# Patient Record
Sex: Female | Born: 1950 | Race: White | Hispanic: No | Smoking: Never smoker
Health system: Southern US, Community
[De-identification: ages and names within clinical notes are randomized; demographics above are authoritative.]

## PROBLEM LIST (undated history)

## (undated) DIAGNOSIS — R195 Other fecal abnormalities: Secondary | ICD-10-CM

## (undated) DIAGNOSIS — I10 Essential (primary) hypertension: Secondary | ICD-10-CM

## (undated) DIAGNOSIS — D649 Anemia, unspecified: Secondary | ICD-10-CM

## (undated) DIAGNOSIS — K219 Gastro-esophageal reflux disease without esophagitis: Secondary | ICD-10-CM

## (undated) DIAGNOSIS — S0300XA Dislocation of jaw, unspecified side, initial encounter: Secondary | ICD-10-CM

## (undated) HISTORY — DX: Other fecal abnormalities: R19.5

## (undated) HISTORY — DX: Gastro-esophageal reflux disease without esophagitis: K21.9

## (undated) HISTORY — PX: TUBAL LIGATION: SHX77

## (undated) HISTORY — DX: Anemia, unspecified: D64.9

## (undated) HISTORY — PX: KNEE SURGERY: SHX244

## (undated) HISTORY — DX: Essential (primary) hypertension: I10

## (undated) HISTORY — PX: TOE SURGERY: SHX1073

---

## 2001-08-11 ENCOUNTER — Other Ambulatory Visit: Admission: RE | Admit: 2001-08-11 | Discharge: 2001-08-11 | Payer: Self-pay | Admitting: *Deleted

## 2001-11-20 LAB — HM MAMMOGRAPHY

## 2002-04-29 LAB — HM DEXA SCAN

## 2013-12-23 ENCOUNTER — Ambulatory Visit: Payer: Self-pay | Admitting: Family Medicine

## 2013-12-23 LAB — BASIC METABOLIC PANEL
BUN: 20 mg/dL (ref 4–21)
CREATININE: 0.8 mg/dL (ref 0.5–1.1)
GLUCOSE: 87 mg/dL
POTASSIUM: 4.3 mmol/L (ref 3.4–5.3)
Sodium: 141 mmol/L (ref 137–147)

## 2013-12-23 LAB — HEPATIC FUNCTION PANEL
ALT: 11 U/L (ref 7–35)
AST: 16 U/L (ref 13–35)
Alkaline Phosphatase: 62 U/L (ref 25–125)
Bilirubin, Total: 0.4 mg/dL

## 2013-12-23 LAB — CBC AND DIFFERENTIAL
HEMATOCRIT: 38 % (ref 36–46)
HEMOGLOBIN: 12.6 g/dL (ref 12.0–16.0)
Neutrophils Absolute: 2 /uL
PLATELETS: 226 10*3/uL (ref 150–399)
WBC: 5.9 10*3/mL

## 2013-12-23 LAB — LIPID PANEL
Cholesterol: 210 mg/dL — AB (ref 0–200)
HDL: 60 mg/dL (ref 35–70)
LDL Cholesterol: 120 mg/dL
LDl/HDL Ratio: 2
Triglycerides: 148 mg/dL (ref 40–160)

## 2013-12-23 LAB — TSH: TSH: 2.12 u[IU]/mL (ref 0.41–5.90)

## 2014-02-04 ENCOUNTER — Ambulatory Visit: Payer: Self-pay | Admitting: Gastroenterology

## 2014-02-04 LAB — HM COLONOSCOPY

## 2014-07-27 ENCOUNTER — Ambulatory Visit: Payer: Self-pay | Admitting: Family Medicine

## 2015-01-18 ENCOUNTER — Telehealth: Payer: Self-pay | Admitting: Family Medicine

## 2015-01-18 MED ORDER — CYCLOBENZAPRINE HCL 10 MG PO TABS
10.0000 mg | ORAL_TABLET | Freq: Every day | ORAL | Status: DC
Start: 2015-01-18 — End: 2015-02-02

## 2015-01-18 NOTE — Telephone Encounter (Signed)
Okay to try cyclobenzaprine at bedtime the night to take less of the ibuprofen. 10 mg at bedtime. #30. 5 refills.g

## 2015-01-18 NOTE — Telephone Encounter (Signed)
Pt would like to speak to someone about ibuprofen dosage for pain.  Please call her [phone]

## 2015-01-18 NOTE — Telephone Encounter (Signed)
Pt reports that she has been having sciatica and she will not be able to get in for a couple of weeks for this. But she has been taking Ibuprofen 800 mg twice a day but sometimes she has to take it 3 times a day and wants to make sure that is on for her to take. She is taking her Ranitidine daily. She has in her allscripts chart that she is on Meloxicam. She is NOT taking it with the Ibuprofen because she knows she cant take them together. She also reports that she does not sleep well because of her legs aching and it keeps her up at night. She was wondering if a muscle relaxer if a muscle relaxer would help her instead of taking the Ibuprofen so much. Please advise.

## 2015-01-18 NOTE — Telephone Encounter (Signed)
Pt informed. Med sent to pharmacy.

## 2015-01-18 NOTE — Telephone Encounter (Signed)
LMTCB

## 2015-01-27 DIAGNOSIS — K219 Gastro-esophageal reflux disease without esophagitis: Secondary | ICD-10-CM | POA: Insufficient documentation

## 2015-01-27 DIAGNOSIS — M549 Dorsalgia, unspecified: Secondary | ICD-10-CM

## 2015-01-27 DIAGNOSIS — I10 Essential (primary) hypertension: Secondary | ICD-10-CM | POA: Insufficient documentation

## 2015-01-27 DIAGNOSIS — F32A Depression, unspecified: Secondary | ICD-10-CM | POA: Insufficient documentation

## 2015-01-27 DIAGNOSIS — F419 Anxiety disorder, unspecified: Secondary | ICD-10-CM | POA: Insufficient documentation

## 2015-01-27 DIAGNOSIS — E785 Hyperlipidemia, unspecified: Secondary | ICD-10-CM | POA: Insufficient documentation

## 2015-01-27 DIAGNOSIS — G8929 Other chronic pain: Secondary | ICD-10-CM | POA: Insufficient documentation

## 2015-01-27 DIAGNOSIS — J309 Allergic rhinitis, unspecified: Secondary | ICD-10-CM | POA: Insufficient documentation

## 2015-01-27 DIAGNOSIS — F329 Major depressive disorder, single episode, unspecified: Secondary | ICD-10-CM | POA: Insufficient documentation

## 2015-01-27 DIAGNOSIS — L821 Other seborrheic keratosis: Secondary | ICD-10-CM | POA: Insufficient documentation

## 2015-01-27 DIAGNOSIS — G4700 Insomnia, unspecified: Secondary | ICD-10-CM | POA: Insufficient documentation

## 2015-02-02 ENCOUNTER — Encounter: Payer: Self-pay | Admitting: Family Medicine

## 2015-02-02 ENCOUNTER — Ambulatory Visit
Admission: RE | Admit: 2015-02-02 | Discharge: 2015-02-02 | Disposition: A | Discharge status: Home / Self Care | Payer: 59 | Source: Ambulatory Visit | Attending: Family Medicine | Admitting: Family Medicine

## 2015-02-02 ENCOUNTER — Ambulatory Visit (INDEPENDENT_AMBULATORY_CARE_PROVIDER_SITE_OTHER): Payer: 59 | Admitting: Family Medicine

## 2015-02-02 VITALS — BP 130/72 | HR 76 | Temp 98.0°F | Resp 16 | Ht 61.0 in | Wt 117.0 lb

## 2015-02-02 DIAGNOSIS — M5442 Lumbago with sciatica, left side: Secondary | ICD-10-CM | POA: Diagnosis not present

## 2015-02-02 DIAGNOSIS — M5441 Lumbago with sciatica, right side: Secondary | ICD-10-CM | POA: Insufficient documentation

## 2015-02-02 NOTE — Progress Notes (Signed)
Patient ID: Jocelyn Harris, female   DOB: 24-Sep-1950, 64 y.o.   MRN: [id number]   CHANNELL QUATTRONE  MRN: [id number] DOB: [birth year]  Subjective:  HPI   1. Bilateral low back pain with sciatica, sciatica laterality unspecified Patient is a 64 year old who present with back pain.  She has had a history of pain and had been doing well until the last few months.  She states that the pain radiated down both of her legs.  She does note that she is on her feet more at work, she has been putting in more hours at work also.  While at work she states that she works on [employer].  She has taken Meloxicam in the past and stopped using it about 3-4 weeks ago because she was reading about some of the side effects.  She had also been taking Ibuprofen while on the Meloxicam.  She would take Ibuprofen during the day and Meloxicam at night.  She has since switched to just taking Ibuprofen and is using 800 mg three times a day.  She has had relief of her pain while taking the Ibuprofen.  She has taken 4 in a day once or twice.  She has been cautioned that taking that much is exceeding the maximum dose and she has been advised of the risks involved with taking any anti inflammatory medications.  She has never had any x-rays made of her spine.   Patient Active Problem List   Diagnosis Date Noted  . Allergic rhinitis 01/27/2015  . Anxiety 01/27/2015  . Back pain, chronic 01/27/2015  . Clinical depression 01/27/2015  . Acid reflux 01/27/2015  . HLD (hyperlipidemia) 01/27/2015  . BP (high blood pressure) 01/27/2015  . Cannot sleep 01/27/2015  . 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  Social History   Social History  . Marital Status: Divorced    Spouse Name: N/A  . Number of Children: N/A  . Years of Education: N/A   Occupational History  . Not on file.   Social History Main Topics  . Smoking status: Never Smoker   . Smokeless tobacco: Not on file  . Alcohol Use: 0.0  oz/week    0 Standard drinks or equivalent per week  . Drug Use: No  . Sexual Activity: Not on file   Other Topics Concern  . Not on file   Social History Narrative    Outpatient Prescriptions Prior to Visit  Medication Sig Dispense Refill  . busPIRone (BUSPAR) 10 MG tablet Take by mouth.    . calcium gluconate 500 MG tablet Take by mouth.    . Cholecalciferol 1000 UNITS capsule Take by mouth.    . fexofenadine (ALLEGRA) 180 MG tablet Take by mouth.    . fluticasone (FLONASE) 50 MCG/ACT nasal spray Place into the nose.    Marland Kitchen lisinopril (PRINIVIL,ZESTRIL) 10 MG tablet Take by mouth.    . MULTIPLE VITAMINS PO Take by mouth.    . ranitidine (ZANTAC) 150 MG tablet Take by mouth.    . traZODone (DESYREL) 150 MG tablet Take by mouth.    . vitamin E 100 UNIT capsule Take by mouth.    . conjugated estrogens (PREMARIN) vaginal cream Place vaginally.    . cyclobenzaprine (FLEXERIL) 10 MG tablet Take 1 tablet (10 mg total) by mouth at bedtime. 30 tablet 5  . meloxicam (MOBIC) 15 MG tablet Take by mouth.     No facility-administered medications prior to  visit.    Allergies  Allergen Reactions  . Doxycycline Nausea Only and Nausea And Vomiting    Review of Systems  Constitutional: Negative.   Eyes: Negative.   Respiratory: Positive for cough (non productive).   Cardiovascular: Negative.   Gastrointestinal: Negative.   Musculoskeletal: Positive for back pain and joint pain (knees). Negative for myalgias, falls and neck pain.  Neurological: Negative for dizziness and headaches.  Psychiatric/Behavioral: Negative.    Objective:  BP 130/72 mmHg  Pulse 76  Temp(Src) 98 F (36.7 C) (Oral)  Resp 16  Ht 5\' 1"  (1.549 m)  Wt 117 lb (53.071 kg)  BMI 22.12 kg/m2  Physical Exam  Constitutional: She is well-developed, well-nourished, and in no distress.  Eyes: Conjunctivae are normal.  Neck: Neck supple.  Cardiovascular: Normal rate and normal heart sounds.   Pulmonary/Chest: Effort  normal.  Abdominal: Soft.  Musculoskeletal: Normal range of motion.  Neurological: She is alert. Gait normal.  Skin: Skin is warm and dry.  Psychiatric: Mood, memory, affect and judgment normal.    Assessment and Plan :   Chronic Back Pain With a flare of sciatica.Discussed management of chronic pain. Consider PT referral. May need imaging. 1. Bilateral low back pain with sciatica, sciatica laterality unspecified  - DG Lumbar Spine Complete; Future - DG Sacrum/Coccyx; Future   Miguel Aschoff MD [facility] 02/02/2015 11:21 AM

## 2015-02-02 NOTE — Patient Instructions (Signed)
Back Exercises Back exercises help treat and prevent back injuries. The goal is to increase your strength in your belly (abdominal) and back muscles. These exercises can also help with flexibility. Start these exercises when told by your doctor. HOME CARE Back exercises include: Pelvic Tilt.  Lie on your back with your knees bent. Tilt your pelvis until the lower part of your back is against the floor. Hold this position 5 to 10 sec. Repeat this exercise 5 to 10 times. Knee to Chest.  Pull 1 knee up against your chest and hold for 20 to 30 seconds. Repeat this with the other knee. This may be done with the other leg straight or bent, whichever feels better. Then, pull both knees up against your chest. Sit-Ups or Curl-Ups.  Bend your knees 90 degrees. Start with tilting your pelvis, and do a partial, slow sit-up. Only lift your upper half 30 to 45 degrees off the floor. Take at least 2 to 3 seonds for each sit-up. Do not do sit-ups with your knees out straight. If partial sit-ups are difficult, simply do the above but with only tightening your belly (abdominal) muscles and holding it as told. Hip-Lift.  Lie on your back with your knees flexed 90 degrees. Push down with your feet and shoulders as you raise your hips 2 inches off the floor. Hold for 10 seconds, repeat 5 to 10 times. Back Arches.  Lie on your stomach. Prop yourself up on bent elbows. Slowly press on your hands, causing an arch in your low back. Repeat 3 to 5 times. Shoulder-Lifts.  Lie face down with arms beside your body. Keep hips and belly pressed to floor as you slowly lift your head and shoulders off the floor. Do not overdo your exercises. Be careful in the beginning. Exercises may cause you some mild back discomfort. If the pain lasts for more than 15 minutes, stop the exercises until you see your doctor. Improvement with exercise for back problems is slow.  Document Released: 07/07/2010 Document Revised: 08/27/2011  Document Reviewed: 04/05/2011 [facility] Patient Information 2015 [place], [place]. This information is not intended to replace advice given to you by your health care provider. Make sure you discuss any questions you have with your health care provider. Back Exercises Back exercises help treat and prevent back injuries. The goal of back exercises is to increase the strength of your abdominal and back muscles and the flexibility of your back. These exercises should be started when you no longer have back pain. Back exercises include:  Pelvic Tilt. Lie on your back with your knees bent. Tilt your pelvis until the lower part of your back is against the floor. Hold this position 5 to 10 sec and repeat 5 to 10 times.  Knee to Chest. Pull first 1 knee up against your chest and hold for 20 to 30 seconds, repeat this with the other knee, and then both knees. This may be done with the other leg straight or bent, whichever feels better.  Sit-Ups or Curl-Ups. Bend your knees 90 degrees. Start with tilting your pelvis, and do a partial, slow sit-up, lifting your trunk only 30 to 45 degrees off the floor. Take at least 2 to 3 seconds for each sit-up. Do not do sit-ups with your knees out straight. If partial sit-ups are difficult, simply do the above but with only tightening your abdominal muscles and holding it as directed.  Hip-Lift. Lie on your back with your knees flexed 90 degrees. Push down  with your feet and shoulders as you raise your hips a couple inches off the floor; hold for 10 seconds, repeat 5 to 10 times.  Back arches. Lie on your stomach, propping yourself up on bent elbows. Slowly press on your hands, causing an arch in your low back. Repeat 3 to 5 times. Any initial stiffness and discomfort should lessen with repetition over time.  Shoulder-Lifts. Lie face down with arms beside your body. Keep hips and torso pressed to floor as you slowly lift your head and shoulders off the floor. Do not overdo  your exercises, especially in the beginning. Exercises may cause you some mild back discomfort which lasts for a few minutes; however, if the pain is more severe, or lasts for more than 15 minutes, do not continue exercises until you see your caregiver. Improvement with exercise therapy for back problems is slow.  See your caregivers for assistance with developing a proper back exercise program. Document Released: 07/12/2004 Document Revised: 08/27/2011 Document Reviewed: 04/05/2011 [facility] Patient Information 2015 [place], [place]. This information is not intended to replace advice given to you by your health care provider. Make sure you discuss any questions you have with your health care provider.

## 2015-02-04 NOTE — Telephone Encounter (Signed)
Pt returning call.  CB#[phone]/MW

## 2015-02-07 ENCOUNTER — Telehealth: Payer: Self-pay | Admitting: Family Medicine

## 2015-02-07 NOTE — Telephone Encounter (Signed)
Pt advised of xray-aa

## 2015-02-07 NOTE — Telephone Encounter (Signed)
Pt is returning call.  CB#[phone]/MW

## 2015-02-24 ENCOUNTER — Encounter: Payer: Self-pay | Admitting: Family Medicine

## 2015-02-24 ENCOUNTER — Ambulatory Visit (INDEPENDENT_AMBULATORY_CARE_PROVIDER_SITE_OTHER): Payer: 59 | Admitting: Family Medicine

## 2015-02-24 VITALS — BP 134/60 | HR 67 | Temp 97.9°F | Resp 16 | Wt 118.0 lb

## 2015-02-24 DIAGNOSIS — J3089 Other allergic rhinitis: Secondary | ICD-10-CM | POA: Diagnosis not present

## 2015-02-24 DIAGNOSIS — N760 Acute vaginitis: Secondary | ICD-10-CM | POA: Diagnosis not present

## 2015-02-24 DIAGNOSIS — R0981 Nasal congestion: Secondary | ICD-10-CM

## 2015-02-24 DIAGNOSIS — N952 Postmenopausal atrophic vaginitis: Secondary | ICD-10-CM

## 2015-02-24 LAB — POCT URINALYSIS DIPSTICK
Bilirubin, UA: NEGATIVE
Glucose, UA: NEGATIVE
Ketones, UA: NEGATIVE
Leukocytes, UA: NEGATIVE
Nitrite, UA: NEGATIVE
PH UA: 6
PROTEIN UA: NEGATIVE
RBC UA: NEGATIVE
SPEC GRAV UA: 1.01
UROBILINOGEN UA: 0.2

## 2015-02-24 LAB — POCT WET PREP WITH KOH
Clue Cells Wet Prep HPF POC: NEGATIVE
KOH PREP POC: NEGATIVE
RBC Wet Prep HPF POC: NEGATIVE
TRICHOMONAS UA: NEGATIVE
WBC WET PREP PER HPF POC: NEGATIVE
Yeast Wet Prep HPF POC: NEGATIVE

## 2015-02-24 MED ORDER — PREDNISONE 5 MG (21) PO TBPK: 5.0000 mg | ORAL_TABLET | Freq: Every day | ORAL | Status: DC

## 2015-02-24 MED ORDER — ESTROGENS, CONJUGATED 0.625 MG/GM VA CREA: 1.0000 | TOPICAL_CREAM | Freq: Every day | VAGINAL | Status: DC

## 2015-02-24 NOTE — Progress Notes (Signed)
Patient ID: RAFAELITA FOISTER, female   DOB: 1950-12-27, 64 y.o.   MRN: [id number]       Patient: Jocelyn Harris Female    DOB: 04-20-1951   64 y.o.   MRN: [id number] Visit Date: 02/24/2015  Today's Provider: Wilhemena Durie, MD   Chief Complaint  Patient presents with  . Nasal Congestion    started about a week ago.  . Vaginitis    noticed it about 2 weeks ago, bladder also feels full.   Subjective:    HPI  Pt reports taken allegra this am which helped some, she also reports feeling feverish.  [facility]  Allergies  Allergen Reactions  . Doxycycline Nausea Only and Nausea And Vomiting   Previous Medications   BUSPIRONE (BUSPAR) 10 MG TABLET    Take by mouth.   CALCIUM GLUCONATE 500 MG TABLET    Take by mouth.   CHOLECALCIFEROL 1000 UNITS CAPSULE    Take by mouth.   CYCLOBENZAPRINE (FLEXERIL) 10 MG TABLET    Take 10 mg by mouth 3 (three) times daily as needed for muscle spasms.   FEXOFENADINE (ALLEGRA) 180 MG TABLET    Take by mouth.   FLUTICASONE (FLONASE) 50 MCG/ACT NASAL SPRAY    Place into the nose.   IBUPROFEN (ADVIL,MOTRIN) 800 MG TABLET    Take 800 mg by mouth every 8 (eight) hours as needed.   LISINOPRIL (PRINIVIL,ZESTRIL) 10 MG TABLET    Take by mouth.   MULTIPLE VITAMINS PO    Take by mouth.   RANITIDINE (ZANTAC) 150 MG TABLET    Take by mouth.   TRAZODONE (DESYREL) 150 MG TABLET    Take by mouth.   VITAMIN E 100 UNIT CAPSULE    Take by mouth.    Review of Systems  HENT: Positive for congestion, ear discharge, ear pain, rhinorrhea, sinus pressure, sneezing and sore throat. Negative for hearing loss, mouth sores, nosebleeds, tinnitus, trouble swallowing and voice change.   Eyes: Positive for discharge and itching. Negative for visual disturbance.  Respiratory: Negative.   Cardiovascular: Negative for chest pain, palpitations and leg swelling.  Endocrine: Negative.   Genitourinary: Positive for frequency and vaginal pain. Negative for vaginal bleeding, vaginal  discharge and difficulty urinating.       Bladder feels full and feel like need to go to the bathroom more." pt stated  Allergic/Immunologic: Negative.   Neurological: Negative.   Hematological: Negative.   Psychiatric/Behavioral: Negative.     Social History  Substance Use Topics  . Smoking status: Never Smoker   . Smokeless tobacco: Not on file  . Alcohol Use: 0.0 oz/week    0 Standard drinks or equivalent per week   Objective:   BP 134/60 mmHg  Pulse 67  Temp(Src) 97.9 F (36.6 C) (Oral)  Resp 16  Wt 118 lb (53.524 kg)  SpO2 97%  Physical Exam  Constitutional: She is oriented to person, place, and time. She appears well-developed and well-nourished.  HENT:  Head: Normocephalic and atraumatic.  Right Ear: External ear normal.  Left Ear: External ear normal.  Nose: Nose normal.  Eyes: Conjunctivae are normal.  Neck: Neck supple.  Cardiovascular: Normal rate, regular rhythm and normal heart sounds.   Pulmonary/Chest: Effort normal and breath sounds normal.  Abdominal: Soft.  Genitourinary: Vagina normal and uterus normal. No vaginal discharge found.  Atrophic vaginal mucosa.. Cervix is normal. Bimanual exam benign.  Neurological: She is alert and oriented to person, place, and time.  Skin: Skin is  warm and dry.  Psychiatric: She has a normal mood and affect. Her behavior is normal. Judgment and thought content normal.        Assessment & Plan:     1. Nasal congestion May need referral to ENT for workup.  2. Vaginitis  - POCT urinalysis dipstick - GC/chlamydia probe amp, genital - POCT Wet Prep with KOH  3. Other allergic rhinitis I think all sinius symptoms are allergic and not a sinusitis - predniSONE (STERAPRED UNI-PAK 21 TAB) 5 MG (21) TBPK tablet; Take 1 tablet (5 mg total) by mouth daily. As directed 6 day taper  Dispense: 21 tablet; Refill: 0  4. Atrophic vaginitis Treat for 1 week nightly then once a week--every Sunday night 1 gram applied  vaginally. Risks and benefits of this type HRT are discussed. - conjugated estrogens (PREMARIN) vaginal cream; Place 1 Applicatorful vaginally daily.  Dispense: 42.5 g; Refill: 12 5.Chronic Anxiety      Wilhemena Durie, MD  [facility]

## 2015-03-10 ENCOUNTER — Encounter: Payer: Self-pay | Admitting: Family Medicine

## 2015-05-05 ENCOUNTER — Ambulatory Visit: Payer: 59 | Admitting: Family Medicine

## 2015-05-27 ENCOUNTER — Other Ambulatory Visit: Payer: Self-pay | Admitting: Family Medicine

## 2015-07-07 ENCOUNTER — Other Ambulatory Visit: Payer: Self-pay | Admitting: Family Medicine

## 2015-07-07 NOTE — Telephone Encounter (Signed)
Pt contacted office for refill request on the following medications: lisinopril (PRINIVIL,ZESTRIL) 10 MG tablet & traZODone (DESYREL) 150 MG tablet to [place]. Thanks TNP

## 2015-07-07 NOTE — Telephone Encounter (Signed)
Is this ok?-aa 

## 2015-07-08 MED ORDER — TRAZODONE HCL 150 MG PO TABS: 150.0000 mg | ORAL_TABLET | Freq: Every day | ORAL | Status: DC

## 2015-07-08 MED ORDER — LISINOPRIL 10 MG PO TABS: 10.0000 mg | ORAL_TABLET | Freq: Every day | ORAL | Status: DC

## 2015-07-08 NOTE — Telephone Encounter (Signed)
RX sent to [facility].

## 2015-07-08 NOTE — Telephone Encounter (Signed)
Ok for 1 year. 

## 2015-07-20 ENCOUNTER — Telehealth: Payer: Self-pay | Admitting: Family Medicine

## 2015-07-20 MED ORDER — RANITIDINE HCL 150 MG PO TABS: 150.0000 mg | ORAL_TABLET | Freq: Two times a day (BID) | ORAL | Status: DC

## 2015-07-20 MED ORDER — IBUPROFEN 800 MG PO TABS: 800.0000 mg | ORAL_TABLET | Freq: Three times a day (TID) | ORAL | Status: DC | PRN

## 2015-07-20 MED ORDER — BUSPIRONE HCL 10 MG PO TABS: 10.0000 mg | ORAL_TABLET | Freq: Three times a day (TID) | ORAL | Status: DC

## 2015-07-20 NOTE — Telephone Encounter (Signed)
rx sent in-aa 

## 2015-07-20 NOTE — Telephone Encounter (Signed)
Pt needs three new refill sent to [facility] on [address]  busPIRone (BUSPAR) 10 MG tablet ibuprofen (ADVIL,MOTRIN) 800 MG tablet ranitidine (ZANTAC) 150 MG tablet   Pt call back is [phone]  Thanks Con Memos

## 2015-08-03 ENCOUNTER — Other Ambulatory Visit: Payer: Self-pay | Admitting: Family Medicine

## 2015-08-03 DIAGNOSIS — N952 Postmenopausal atrophic vaginitis: Secondary | ICD-10-CM

## 2015-08-03 MED ORDER — ESTROGENS, CONJUGATED 0.625 MG/GM VA CREA: 1.0000 | TOPICAL_CREAM | Freq: Every day | VAGINAL | Status: DC

## 2015-08-03 NOTE — Telephone Encounter (Signed)
RX sent in-aa 

## 2015-08-03 NOTE — Telephone Encounter (Signed)
Pt stated that due to her insurance change she is having to get her medications from [facility] and is having a hard time getting [facility] to transfer them to [facility]. Pt would like a new RX sent to [facility]. Thanks TNP

## 2015-08-25 ENCOUNTER — Encounter: Payer: Self-pay | Admitting: Family Medicine

## 2015-08-25 ENCOUNTER — Ambulatory Visit (INDEPENDENT_AMBULATORY_CARE_PROVIDER_SITE_OTHER): Payer: Worker's Compensation | Admitting: Family Medicine

## 2015-08-25 VITALS — BP 138/72 | HR 62 | Temp 98.4°F | Resp 18 | Wt 123.0 lb

## 2015-08-25 DIAGNOSIS — S0003XA Contusion of scalp, initial encounter: Secondary | ICD-10-CM | POA: Diagnosis not present

## 2015-08-25 DIAGNOSIS — S060X0A Concussion without loss of consciousness, initial encounter: Secondary | ICD-10-CM

## 2015-08-25 NOTE — Patient Instructions (Signed)
Stay out of work for the next two days and allow your brain to rest. F/u with Dr. Rosanna Randy next week.

## 2015-08-25 NOTE — Progress Notes (Signed)
Subjective:     Patient ID: Jocelyn Harris, female   DOB: [birth year], 65 y.o.   MRN: [id number]  HPI  Chief Complaint  Patient presents with  . Head Injury    She hit her at work and it knocked her down but not out.   . Fatigue  . Dizziness    More like lightheaded per pt.  States she works at [employer]. She was putting cardboard in a dumpster 3/7 when the steel door came back and struck her in the back of her head knocking her down. States she had a bump at first then but this has resolved leaving her with residual tenderness and feeling "not herself" and "dazed". States she went to work yesterday and drove herself her. Reports hx of concussion 5 years ago.    Review of Systems     Objective:   Physical Exam  Constitutional: She is oriented to person, place, and time. She appears well-developed and well-nourished. No distress.  HENT:  Right Ear: No hemotympanum.  Left Ear: No hemotympanum.  Eyes: EOM are normal. Pupils are equal, round, and reactive to light.  Musculoskeletal:  Grip strength 5/5 symmetrically  Neurological: She is alert and oriented to person, place, and time. Coordination (Finger to Nose and heel t shin WNL. Romberg negative) normal.  Can recite day of the week, month, year, and president. Can recite months backwards and count down from 20 backwards. Can recite numbers backwards to 5 digits. Can recall 5 words after the third trial.       Assessment:    1. Scalp contusion, initial encounter   2. Mild concussion, without loss of consciousness, initial encounter    Plan:    Work excuse 3/9-3/12. Discussed resting her brain. Further f/u with Dr. Rosanna Randy next week if not back to baseline.

## 2015-08-29 ENCOUNTER — Encounter: Payer: Self-pay | Admitting: Family Medicine

## 2015-08-29 ENCOUNTER — Ambulatory Visit (INDEPENDENT_AMBULATORY_CARE_PROVIDER_SITE_OTHER): Payer: BLUE CROSS/BLUE SHIELD | Admitting: Family Medicine

## 2015-08-29 VITALS — BP 130/72 | HR 64 | Temp 97.9°F | Resp 12 | Wt 124.0 lb

## 2015-08-29 DIAGNOSIS — J3089 Other allergic rhinitis: Secondary | ICD-10-CM | POA: Diagnosis not present

## 2015-08-29 DIAGNOSIS — G44309 Post-traumatic headache, unspecified, not intractable: Secondary | ICD-10-CM

## 2015-08-29 DIAGNOSIS — F329 Major depressive disorder, single episode, unspecified: Secondary | ICD-10-CM

## 2015-08-29 DIAGNOSIS — R5383 Other fatigue: Secondary | ICD-10-CM | POA: Diagnosis not present

## 2015-08-29 DIAGNOSIS — G8929 Other chronic pain: Secondary | ICD-10-CM | POA: Diagnosis not present

## 2015-08-29 DIAGNOSIS — K219 Gastro-esophageal reflux disease without esophagitis: Secondary | ICD-10-CM

## 2015-08-29 DIAGNOSIS — S060X0A Concussion without loss of consciousness, initial encounter: Secondary | ICD-10-CM

## 2015-08-29 DIAGNOSIS — S0003XA Contusion of scalp, initial encounter: Secondary | ICD-10-CM

## 2015-08-29 DIAGNOSIS — F32A Depression, unspecified: Secondary | ICD-10-CM

## 2015-08-29 DIAGNOSIS — M549 Dorsalgia, unspecified: Secondary | ICD-10-CM

## 2015-08-29 MED ORDER — RANITIDINE HCL 150 MG PO TABS: 150.0000 mg | ORAL_TABLET | Freq: Two times a day (BID) | ORAL | Status: DC

## 2015-08-29 MED ORDER — BUSPIRONE HCL 10 MG PO TABS: 10.0000 mg | ORAL_TABLET | Freq: Three times a day (TID) | ORAL | Status: DC

## 2015-08-29 MED ORDER — FLUTICASONE PROPIONATE 50 MCG/ACT NA SUSP: 2.0000 | Freq: Every day | NASAL | Status: DC

## 2015-08-29 MED ORDER — IBUPROFEN 800 MG PO TABS: 800.0000 mg | ORAL_TABLET | Freq: Three times a day (TID) | ORAL | Status: DC | PRN

## 2015-08-29 NOTE — Progress Notes (Signed)
Patient ID: Jocelyn Harris, female   DOB: 1951/04/19, 65 y.o.   MRN: [id number]    Subjective:  HPI Patient saw Mikki Santee on Thursday the 9th 2 days after the accident and this is what patient reported at that time:  States she works at [employer]. She was putting cardboard in a dumpster 3/7 when the steel door came back and struck her in the back of her head knocking her down. States she had a bump at first then but this has resolved leaving her with residual tenderness and feeling "not herself" and "dazed". Reports hx of concussion 5 years ago.   Today patient states she is still having headache where the door hit her and she fell to the ground, and head feels sore, feels like her balance is unsteady and she does not feel as alert. She does state she is better compare to last week. She has not returned to work since Wednesday March 8th.   Prior to Admission medications   Medication Sig Start Date End Date Taking? Authorizing Provider  busPIRone (BUSPAR) 10 MG tablet Take 1 tablet (10 mg total) by mouth 3 (three) times daily. 07/20/15   Jerrol Banana., MD  calcium gluconate 500 MG tablet Take by mouth.    Historical Provider, MD  Cholecalciferol 1000 UNITS capsule Take by mouth.    Historical Provider, MD  conjugated estrogens (PREMARIN) vaginal cream Place 1 Applicatorful vaginally daily. 08/03/15   Richard Maceo Pro., MD  cyclobenzaprine (FLEXERIL) 10 MG tablet Take 10 mg by mouth 3 (three) times daily as needed for muscle spasms.    Historical Provider, MD  EQ ALLERGY RELIEF 180 MG tablet TAKE ONE TABLET BY MOUTH ONCE DAILY 05/27/15   Jerrol Banana., MD  fluticasone [facility]) 50 MCG/ACT nasal spray Place into the nose. 05/01/14   Historical Provider, MD  ibuprofen (ADVIL,MOTRIN) 800 MG tablet Take 1 tablet (800 mg total) by mouth every 8 (eight) hours as needed. 07/20/15   Richard Maceo Pro., MD  lisinopril (PRINIVIL,ZESTRIL) 10 MG tablet Take 1 tablet (10 mg total) by mouth  daily. 07/08/15   Richard Maceo Pro., MD  MULTIPLE VITAMINS PO Take by mouth.    Historical Provider, MD  predniSONE (STERAPRED UNI-PAK 21 TAB) 5 MG (21) TBPK tablet Take 1 tablet (5 mg total) by mouth daily. As directed 6 day taper 02/24/15   Jerrol Banana., MD  ranitidine (ZANTAC) 150 MG tablet Take 1 tablet (150 mg total) by mouth 2 (two) times daily. 07/20/15   Richard Maceo Pro., MD  traZODone (DESYREL) 150 MG tablet Take 1 tablet (150 mg total) by mouth at bedtime. 07/08/15   Jerrol Banana., MD  vitamin E 100 UNIT capsule Take by mouth.    Historical Provider, MD    Patient Active Problem List   Diagnosis Date Noted  . Allergic rhinitis 01/27/2015  . Anxiety 01/27/2015  . Back pain, chronic 01/27/2015  . Clinical depression 01/27/2015  . Acid reflux 01/27/2015  . HLD (hyperlipidemia) 01/27/2015  . BP (high blood pressure) 01/27/2015  . Cannot sleep 01/27/2015  . 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  Social History   Social History  . Marital Status: Divorced    Spouse Name: N/A  . Number of Children: N/A  . Years of Education: N/A   Occupational History  . Not on file.   Social History Main Topics  . Smoking  status: Never Smoker   . Smokeless tobacco: Never Used  . Alcohol Use: 0.0 oz/week    0 Standard drinks or equivalent per week  . Drug Use: No  . Sexual Activity: Not on file   Other Topics Concern  . Not on file   Social History Narrative    Allergies  Allergen Reactions  . Doxycycline Nausea Only and Nausea And Vomiting    Review of Systems  Constitutional: Positive for malaise/fatigue. Negative for fever and chills.  Eyes: Positive for blurred vision.  Respiratory: Negative.   Cardiovascular: Negative.   Musculoskeletal: Positive for back pain (chronic) and neck pain.  Neurological: Positive for headaches. Negative for weakness.       Unsteadiness slight, "not as alert"  Psychiatric/Behavioral: The  patient does not have insomnia.     Immunization History  Administered Date(s) Administered  . Influenza-Unspecified 06/16/2015  . Tdap 12/23/2013  . Zoster 01/20/2014   Objective:  BP 130/72 mmHg  Pulse 64  Temp(Src) 97.9 F (36.6 C)  Resp 12  Wt 124 lb (56.246 kg)  Physical Exam  Constitutional: She is oriented to person, place, and time and well-developed, well-nourished, and in no distress.  HENT:  Head: Normocephalic and atraumatic.  Mouth/Throat: Oropharynx is clear and moist.  Eyes: Conjunctivae are normal.  Neck: Normal range of motion. Neck supple.  Cardiovascular: Normal rate, regular rhythm, normal heart sounds and intact distal pulses.   No murmur heard. Pulmonary/Chest: Effort normal and breath sounds normal. No respiratory distress. She has no wheezes.  Musculoskeletal: Normal range of motion. She exhibits no edema or tenderness.  Neurological: She is alert and oriented to person, place, and time. She displays normal reflexes. No cranial nerve deficit. She exhibits normal muscle tone. Gait normal. Coordination normal. GCS score is 15.  Tender at the top of the head on the exam.Neurologic exam grossly nonfocal.  Psychiatric: Mood, affect and judgment normal.    Lab Results  Component Value Date   WBC 5.9 12/23/2013   HGB 12.6 12/23/2013   HCT 38 12/23/2013   PLT 226 12/23/2013   CHOL 210* 12/23/2013   TRIG 148 12/23/2013   HDL 60 12/23/2013   LDLCALC 120 12/23/2013   TSH 2.12 12/23/2013    CMP     Component Value Date/Time   NA 141 12/23/2013   K 4.3 12/23/2013   BUN 20 12/23/2013   CREATININE 0.8 12/23/2013   AST 16 12/23/2013   ALT 11 12/23/2013   ALKPHOS 62 12/23/2013    Assessment and Plan :  1. Mild concussion, without loss of consciousness, initial encounter Stable. Neurological exam today normal. Advised patient to push water intake. Discussed with patient what this diagnoses means and the length of time it could take-can not say 100%  when she will feel back to herself, and that no further work up needed at this time. Time and rest should be all that the patient needs. Advised patient that she can come back to work tomorrow March 14th but no climbing up on stools, etc. For 1 week until March 20th. Advised patient that she can lift things and that it could affect her headache but not the concussion part.  2. Scalp contusion, initial encounter Still tender today. Better from last week per patient.  3. Other allergic rhinitis Refill for Flonase provided.  4. Gastroesophageal reflux disease, esophagitis presence not specified Stable.  5. Clinical depression Stable.  6. Back pain, chronic Stable. Refill given.  Patient was seen and examined  by Dr. Eulas Post and note was scribed by Theressa Millard, RMA.   I have done the exam and reviewed the above chart and it is accurate to the best of my knowledge.  Miguel Aschoff MD [facility] 08/29/2015 8:31 AM

## 2015-08-29 NOTE — Patient Instructions (Signed)
Use Premarin cream every day for the rest of March month and then use it twice a week after that.

## 2015-08-31 ENCOUNTER — Ambulatory Visit: Payer: BLUE CROSS/BLUE SHIELD | Admitting: Family Medicine

## 2015-09-01 ENCOUNTER — Ambulatory Visit: Payer: BLUE CROSS/BLUE SHIELD | Admitting: Family Medicine

## 2015-09-08 ENCOUNTER — Other Ambulatory Visit: Payer: Self-pay | Admitting: Family Medicine

## 2015-09-08 ENCOUNTER — Telehealth: Payer: Self-pay

## 2015-09-08 DIAGNOSIS — Z1239 Encounter for other screening for malignant neoplasm of breast: Secondary | ICD-10-CM

## 2015-09-08 NOTE — Telephone Encounter (Signed)
Patient started to use Premarin cream for almost a month now uses it once daily internally. The past few days noticed some irritation in vaginal area almost not severe she states, a little burning and discomfort, no urinary issues no blood no discharge. She wanted to know if this could be coming from the cream some irritation from it? Please review-aa

## 2015-09-08 NOTE — Telephone Encounter (Signed)
Patient advised, patient will decrease frequency of use of this cream and will let us know if symptoms persist or get worse-aa

## 2015-09-08 NOTE — Telephone Encounter (Signed)
Could be. Consider GYN referral if it is not helping.

## 2015-09-26 ENCOUNTER — Encounter: Payer: Self-pay | Admitting: Physician Assistant

## 2015-09-26 ENCOUNTER — Ambulatory Visit (INDEPENDENT_AMBULATORY_CARE_PROVIDER_SITE_OTHER): Payer: BLUE CROSS/BLUE SHIELD | Admitting: Physician Assistant

## 2015-09-26 VITALS — BP 140/78 | HR 68 | Temp 98.3°F | Resp 16 | Wt 121.4 lb

## 2015-09-26 DIAGNOSIS — S39012A Strain of muscle, fascia and tendon of lower back, initial encounter: Secondary | ICD-10-CM | POA: Diagnosis not present

## 2015-09-26 NOTE — Progress Notes (Signed)
Patient: Jocelyn Harris Female    DOB: 09/22/1950   65 y.o.   MRN: [id number] Visit Date: 09/26/2015  Today's Provider: Mar Daring, PA-C   Chief Complaint  Patient presents with  . Back Pain   Subjective:    Back Pain This is a chronic (History of back pain) problem. The current episode started 1 to 4 weeks ago (Last week patient lifted a bin full of clothes that was heavy.). The problem occurs constantly. The problem has been gradually worsening since onset. The pain is present in the lumbar spine (Lower back on the right side). The quality of the pain is described as stabbing and shooting. The pain radiates to the right foot, right thigh, right knee, left knee, left foot and left thigh (She gets tingling on both legs, but right side is worse). The pain is at a severity of 10/10. The pain is severe. The pain is the same all the time. The symptoms are aggravated by bending and sitting (Walking; while she walks she gets like a sooting pain.). Associated symptoms include leg pain, tingling and weakness. Pertinent negatives include no abdominal pain, bladder incontinence, bowel incontinence, chest pain, dysuria, fever, headaches, numbness or perianal numbness. She has tried ice and NSAIDs (Ibuprofen 800mg  and Cold packs) for the symptoms. The treatment provided mild relief.       Allergies  Allergen Reactions  . Doxycycline Nausea Only and Nausea And Vomiting   Previous Medications   BUSPIRONE (BUSPAR) 10 MG TABLET    Take 1 tablet (10 mg total) by mouth 3 (three) times daily.   CALCIUM GLUCONATE 500 MG TABLET    Take by mouth.   CHOLECALCIFEROL 1000 UNITS CAPSULE    Take by mouth.   CONJUGATED ESTROGENS (PREMARIN) VAGINAL CREAM    Place 1 Applicatorful vaginally daily.   CYCLOBENZAPRINE (FLEXERIL) 10 MG TABLET    Take 10 mg by mouth 3 (three) times daily as needed for muscle spasms.   EQ ALLERGY RELIEF 180 MG TABLET    TAKE ONE TABLET BY MOUTH ONCE DAILY   FLUTICASONE  (FLONASE) 50 MCG/ACT NASAL SPRAY    Place 2 sprays into both nostrils daily.   IBUPROFEN (ADVIL,MOTRIN) 800 MG TABLET    Take 1 tablet (800 mg total) by mouth every 8 (eight) hours as needed.   LISINOPRIL (PRINIVIL,ZESTRIL) 10 MG TABLET    Take 1 tablet (10 mg total) by mouth daily.   MULTIPLE VITAMINS PO    Take by mouth.   PREDNISONE (STERAPRED UNI-PAK 21 TAB) 5 MG (21) TBPK TABLET    Take 1 tablet (5 mg total) by mouth daily. As directed 6 day taper   RANITIDINE (ZANTAC) 150 MG TABLET    Take 1 tablet (150 mg total) by mouth 2 (two) times daily.   TRAZODONE (DESYREL) 150 MG TABLET    Take 1 tablet (150 mg total) by mouth at bedtime.   VITAMIN E 100 UNIT CAPSULE    Take by mouth.    Review of Systems  Constitutional: Negative for fever and fatigue.  HENT: Negative.   Eyes: Negative.   Respiratory: Negative.   Cardiovascular: Negative for chest pain, palpitations and leg swelling.  Gastrointestinal: Negative for abdominal pain and bowel incontinence.  Genitourinary: Negative for bladder incontinence and dysuria.  Musculoskeletal: Positive for back pain. Negative for gait problem.  Neurological: Positive for tingling and weakness. Negative for numbness and headaches.    Social History  Substance Use Topics  .  Smoking status: Never Smoker   . Smokeless tobacco: Never Used  . Alcohol Use: 0.0 oz/week    0 Standard drinks or equivalent per week   Objective:   BP 140/78 mmHg  Pulse 68  Temp(Src) 98.3 F (36.8 C) (Oral)  Resp 16  Wt 121 lb 6.4 oz (55.067 kg)  Physical Exam  Constitutional: She appears well-developed and well-nourished. No distress.  Neck: Normal range of motion. Neck supple.  Cardiovascular: Normal rate, regular rhythm and normal heart sounds.  Exam reveals no gallop and no friction rub.   No murmur heard. Pulmonary/Chest: Effort normal and breath sounds normal. No respiratory distress. She has no wheezes. She has no rales.  Musculoskeletal:       Thoracic  back: Normal.       Lumbar back: She exhibits decreased range of motion (limited flexion and felt pain with lateral flexion to right), tenderness and spasm (right paraspinal and right quadratus lumborum). She exhibits no bony tenderness, no edema and normal pulse.  Strength was 5/5 bilateral lower extremities. Neurovasc grossly intact.  Neg SLR bilaterally  Skin: She is not diaphoretic.  Vitals reviewed.       Assessment & Plan:     1. Low back strain, initial encounter Acute on chronic low back strain with muscle spasm noted in right paraspinal and quadratus lumborum. Advised to use moist heat 15-20 minutes 3 times per day. Continue IBU 800 mg TID. Add flexeril at night for spasm. May take during day when not working or going to be at home. Hold trazodone while taking flexeril. Back exercises given to patient. Discussed PT for chronic low back pain in future. She will call if this is wanted. No heavy lifting greater than 15 pounds x 2 weeks. Work restriction note given. She is to call if symptoms worsen or if no improvement in next 2 weeks.       Mar Daring, PA-C  [facility]

## 2015-09-26 NOTE — Patient Instructions (Signed)

## 2015-10-03 ENCOUNTER — Encounter: Payer: Self-pay | Admitting: Family Medicine

## 2015-10-03 ENCOUNTER — Ambulatory Visit (INDEPENDENT_AMBULATORY_CARE_PROVIDER_SITE_OTHER): Payer: BLUE CROSS/BLUE SHIELD | Admitting: Family Medicine

## 2015-10-03 VITALS — BP 112/62 | HR 72 | Temp 97.9°F | Resp 16 | Wt 120.0 lb

## 2015-10-03 DIAGNOSIS — J320 Chronic maxillary sinusitis: Secondary | ICD-10-CM | POA: Diagnosis not present

## 2015-10-03 DIAGNOSIS — M545 Low back pain: Secondary | ICD-10-CM

## 2015-10-03 DIAGNOSIS — J309 Allergic rhinitis, unspecified: Secondary | ICD-10-CM

## 2015-10-03 MED ORDER — MONTELUKAST SODIUM 10 MG PO TABS: 10.0000 mg | ORAL_TABLET | Freq: Every day | ORAL | Status: DC

## 2015-10-03 MED ORDER — AMOXICILLIN-POT CLAVULANATE 875-125 MG PO TABS: 1.0000 | ORAL_TABLET | Freq: Two times a day (BID) | ORAL | Status: DC

## 2015-10-03 NOTE — Progress Notes (Signed)
Patient ID: Jocelyn Harris, female   DOB: [birth year], 65 y.o.   MRN: [id number]    Subjective:  HPI Pt reports that she has not felt good in about 2 weeks. She has had headaches, dry cough, sneezing, sore throat, chest tightness, chills, burning in her ears, slight shortness of breath. She reports that she always has trouble this time of year with allergies. She been taking her allergy medication and mucinex but has only helped a little. She saw Tawanna Sat last week for back pain and reports that is better, still hurting but is better.    Prior to Admission medications   Medication Sig Start Date End Date Taking? Authorizing Provider  busPIRone (BUSPAR) 10 MG tablet Take 1 tablet (10 mg total) by mouth 3 (three) times daily. 08/29/15  Yes Richard Maceo Pro., MD  calcium gluconate 500 MG tablet Take by mouth.   Yes Historical Provider, MD  Cholecalciferol 1000 UNITS capsule Take by mouth.   Yes Historical Provider, MD  conjugated estrogens (PREMARIN) vaginal cream Place 1 Applicatorful vaginally daily. 08/03/15  Yes Richard Maceo Pro., MD  cyclobenzaprine (FLEXERIL) 10 MG tablet Take 10 mg by mouth 3 (three) times daily as needed for muscle spasms.   Yes Historical Provider, MD  EQ ALLERGY RELIEF 180 MG tablet TAKE ONE TABLET BY MOUTH ONCE DAILY 05/27/15  Yes Jerrol Banana., MD  fluticasone [facility]) 50 MCG/ACT nasal spray Place 2 sprays into both nostrils daily. 08/29/15  Yes Richard Maceo Pro., MD  ibuprofen (ADVIL,MOTRIN) 800 MG tablet Take 1 tablet (800 mg total) by mouth every 8 (eight) hours as needed. 08/29/15  Yes Richard Maceo Pro., MD  lisinopril (PRINIVIL,ZESTRIL) 10 MG tablet Take 1 tablet (10 mg total) by mouth daily. 07/08/15  Yes Richard Maceo Pro., MD  MULTIPLE VITAMINS PO Take by mouth.   Yes Historical Provider, MD  ranitidine (ZANTAC) 150 MG tablet Take 1 tablet (150 mg total) by mouth 2 (two) times daily. 08/29/15  Yes Richard Maceo Pro., MD  traZODone (DESYREL)  150 MG tablet Take 1 tablet (150 mg total) by mouth at bedtime. 07/08/15  Yes Richard Maceo Pro., MD  vitamin E 100 UNIT capsule Take by mouth.   Yes Historical Provider, MD  predniSONE (STERAPRED UNI-PAK 21 TAB) 5 MG (21) TBPK tablet Take 1 tablet (5 mg total) by mouth daily. As directed 6 day taper Patient not taking: Reported on 10/03/2015 02/24/15   Jerrol Banana., MD    Patient Active Problem List   Diagnosis Date Noted  . Allergic rhinitis 01/27/2015  . Anxiety 01/27/2015  . Back pain, chronic 01/27/2015  . Clinical depression 01/27/2015  . Acid reflux 01/27/2015  . HLD (hyperlipidemia) 01/27/2015  . BP (high blood pressure) 01/27/2015  . Cannot sleep 01/27/2015  . 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 No pertinent past medical history.  Social History   Social History  . Marital Status: Divorced    Spouse Name: N/A  . Number of Children: N/A  . Years of Education: N/A   Occupational History  . Not on file.   Social History Main Topics  . Smoking status: Never Smoker   . Smokeless tobacco: Never Used  . Alcohol Use: 0.0 oz/week    0 Standard drinks or equivalent per week  . Drug Use: No  . Sexual Activity: Not on file   Other Topics Concern  . Not on file   Social History  Narrative    Allergies  Allergen Reactions  . Doxycycline Nausea Only and Nausea And Vomiting    Review of Systems  Constitutional: Positive for malaise/fatigue.  HENT: Positive for congestion, ear pain and sore throat.   Eyes: Negative.   Respiratory: Positive for cough and shortness of breath.   Cardiovascular: Positive for chest pain.  Gastrointestinal: Negative.   Genitourinary: Negative.   Musculoskeletal: Positive for back pain.  Skin: Negative.   Neurological: Positive for headaches.  Endo/Heme/Allergies: Negative.   Psychiatric/Behavioral: Negative.     Immunization History  Administered Date(s) Administered  . Influenza-Unspecified 06/16/2015  .  Tdap 12/23/2013  . Zoster 01/20/2014   Objective:  BP 112/62 mmHg  Pulse 72  Temp(Src) 97.9 F (36.6 C) (Oral)  Resp 16  Wt 120 lb (54.432 kg)  SpO2 98%  Physical Exam  Constitutional: She is oriented to person, place, and time and well-developed, well-nourished, and in no distress.  HENT:  Head: Normocephalic and atraumatic.  Right Ear: External ear normal.  Left Ear: External ear normal.  Nose: Nose normal.  Mouth/Throat: Oropharynx is clear and moist.  Maxillary sinus tenderness  Eyes: Conjunctivae and EOM are normal. Pupils are equal, round, and reactive to light.  Neck: Normal range of motion. Neck supple.  Cardiovascular: Normal rate, regular rhythm, normal heart sounds and intact distal pulses.   Pulmonary/Chest: Effort normal and breath sounds normal.  Abdominal: Soft.  Musculoskeletal: Normal range of motion.  Neurological: She is alert and oriented to person, place, and time. She has normal reflexes. Gait normal. GCS score is 15.  Skin: Skin is warm and dry.  Psychiatric: Mood, memory, affect and judgment normal.    Lab Results  Component Value Date   WBC 5.9 12/23/2013   HGB 12.6 12/23/2013   HCT 38 12/23/2013   PLT 226 12/23/2013   CHOL 210* 12/23/2013   TRIG 148 12/23/2013   HDL 60 12/23/2013   LDLCALC 120 12/23/2013   TSH 2.12 12/23/2013    CMP     Component Value Date/Time   NA 141 12/23/2013   K 4.3 12/23/2013   BUN 20 12/23/2013   CREATININE 0.8 12/23/2013   AST 16 12/23/2013   ALT 11 12/23/2013   ALKPHOS 62 12/23/2013    Assessment and Plan :  1. Low back pain, unspecified back pain laterality, with sciatica presence unspecified  - Ambulatory referral to Physical Therapy  2. Allergic rhinitis, unspecified allergic rhinitis type  - montelukast (SINGULAIR) 10 MG tablet; Take 1 tablet (10 mg total) by mouth at bedtime.  Dispense: 30 tablet; Refill: 12  3. Maxillary sinusitis, unspecified chronicity ENT this does not resolve or if it  recurs. - amoxicillin-clavulanate (AUGMENTIN) 875-125 MG tablet; Take 1 tablet by mouth 2 (two) times daily.  Dispense: 20 tablet; Refill: 0  I have done the exam and reviewed the above chart and it is accurate to the best of my knowledge.  Patient was seen and examined by Dr. Miguel Aschoff, and noted scribed by Webb Laws, [place] MD [facility] 10/03/2015 10:26 AM

## 2015-10-18 ENCOUNTER — Ambulatory Visit
Admission: RE | Admit: 2015-10-18 | Discharge: 2015-10-18 | Disposition: A | Discharge status: Home / Self Care | Payer: BLUE CROSS/BLUE SHIELD | Source: Ambulatory Visit | Attending: Family Medicine | Admitting: Family Medicine

## 2015-10-18 ENCOUNTER — Ambulatory Visit: Payer: BLUE CROSS/BLUE SHIELD | Attending: Family Medicine | Admitting: Physical Therapy

## 2015-10-18 ENCOUNTER — Encounter: Payer: Self-pay | Admitting: Family Medicine

## 2015-10-18 ENCOUNTER — Ambulatory Visit (INDEPENDENT_AMBULATORY_CARE_PROVIDER_SITE_OTHER): Payer: BLUE CROSS/BLUE SHIELD | Admitting: Family Medicine

## 2015-10-18 VITALS — BP 128/70 | HR 68 | Temp 98.5°F | Resp 16 | Ht 61.0 in | Wt 120.0 lb

## 2015-10-18 DIAGNOSIS — M25532 Pain in left wrist: Secondary | ICD-10-CM | POA: Diagnosis not present

## 2015-10-18 DIAGNOSIS — R937 Abnormal findings on diagnostic imaging of other parts of musculoskeletal system: Secondary | ICD-10-CM | POA: Diagnosis not present

## 2015-10-18 DIAGNOSIS — M79642 Pain in left hand: Secondary | ICD-10-CM | POA: Diagnosis not present

## 2015-10-18 DIAGNOSIS — M5441 Lumbago with sciatica, right side: Secondary | ICD-10-CM | POA: Diagnosis present

## 2015-10-18 NOTE — Therapy (Signed)
[place] [facility] [address], [place], [address] Phone: [phone]   Fax:  [phone]  Physical Therapy Evaluation  Patient Details  Name: Jocelyn Harris MRN: [id number] Date of Birth: [birth year] No Data Recorded  Encounter Date: 10/18/2015      PT End of Session - 10/18/15 1306    Visit Number 1   Number of Visits 9   Date for PT Re-Evaluation 11/15/15   PT Start Time 1005   PT Stop Time 1058   PT Time Calculation (min) 53 min   Activity Tolerance Patient tolerated treatment well   Behavior During Therapy [facility] for tasks assessed/performed      No past medical history on file.  Past Surgical History  Procedure Laterality Date  . Tubal ligation    . Knee surgery Right   . Toe surgery      There were no vitals filed for this visit.       Subjective Assessment - 10/18/15 1323    Subjective Patient reports she began to develop R sided lower back/posterior hip pain 2-3 months ago after lifting something at home. Her symptoms have progressively worsened (more often/more intense). She has had 3-4 prior episodes of both R and L sided LBP. She reports tingling down into feet now (bilaterally).    Pertinent History Denies blurry vision, no change in weight. She would like to return to the gym, she thinks she may have hurt her back while at the gym a few years ago.    Limitations Lifting;Walking;Standing   Patient Stated Goals To return to full work duties. (Cold seems to help).    Currently in Pain? No/denies  Pain at worst is 9/10            [facility] PT Assessment - 10/18/15 1316    Assessment   Medical Diagnosis --  Low back pain with sciatica   Referring Provider --  Dr. Rosanna Randy   Precautions   Precautions None   Restrictions   Weight Bearing Restrictions No   Balance Screen   Has the patient fallen in the past 6 months Yes   How many times? 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   Cognition   Overall Cognitive Status Within Functional Limits for tasks assessed   Observation/Other Assessments   Modified [place] [address]   Sensation   Light Touch Appears Intact   Sit to Stand   Comments --  No use of hands, no deficits   ROM / Strength   AROM / PROM / Strength --  All WNL for LE strength   Palpation   Spinal mobility --  WNL throughout spine, mild tenderness in lower lumbar   Palpation comment --  tender to palpation in mid gluteal/piriformis area   FABER test   findings Negative   Slump test   Findings Positive   Side Right   Straight Leg Raise   Findings Negative   Criag's Test    Findings Negative   Ely's Test   Findings Negative   Ambulation/Gait   Gait Comments --  No gait abnormalities observed      Initially pain with flexion, unable to reach toes. Pain with extension full range. Pain with L side bending, none with  rotation SLS - WNL on both sides  Piriformis strethcing x 5 sets of 10 repetitions for 5" holds , able to touch floor, significant relief of symptoms (no pain with sidebending either).  Sciatic n glossing x 10 for 3 sets on RLE (reported decreased pain with trunk flexion, but not with side bending)  HS length - WNL                        PT Education - 10/18/15 1304    Education provided Yes   Education Details Back pain resolves quickly, DDD is often benign, perform stretching as tolerated.    Person(s) Educated Patient   Methods Explanation;Demonstration;Handout   Comprehension Returned demonstration;Verbalized understanding             PT Long Term Goals - 10/18/15 1308    PT LONG TERM GOAL #1   Title Patient will be able to stand for her 8 hour shift at work with no increased symptoms.    Baseline Difficulty completing shifts  due to pain    Time 4   Period Weeks   Status New   PT LONG TERM GOAL #2   Title Patient will demonstrate full AROM into trunk flexion/extension with no increase in pain to demonstrate ability to complete work related tasks.    Baseline Pain at end range flexion/extension    Time 4   Period Weeks   Status New   PT LONG TERM GOAL #3   Title Patient will report no pain for at least 3 consecutive days to demonstrate improved ability to complete ADLs.    Time 4   Period Weeks   Status New   PT LONG TERM GOAL #4   Title Patient will report a modified ODI score of less than 25% disability to demonstrate improved tolerance for work related activities.    Baseline 36%   Time 4   Period Weeks   Status New               Plan - 10/18/15 1311    Clinical Impression Statement Patient appears to have an acute exacerbation of sciatic nerve on her RLE at roughly mid level of piriformis. She reports pain with hip abductor testing, slump test, and palpation over piriformis. She reports relief of symptoms with provactive maneuvers in session with stretching directed at piriformis. She will likely require education on lifting mechanics and basic strengthening program to allow for return to gym based activities.    Rehab Potential Good   Clinical Impairments Affecting Rehab Potential Multiple successful resolutions of prior episodes.    PT Frequency 2x / week   PT Duration 4 weeks   PT Treatment/Interventions Cryotherapy;Electrical Stimulation;Therapeutic exercise;Therapeutic activities;Manual techniques;Dry needling   PT Next Visit Plan Pirifiormis stretching and isometrics, sciatic nerve glides, soft tissue mobilizations as indicated.    PT Home Exercise Plan See patient instructions.    Consulted and Agree with Plan of Care Patient      Patient will benefit from skilled therapeutic intervention in order to improve the following deficits and impairments:  Pain, Impaired perceived functional  ability, Decreased strength, Decreased balance  Visit Diagnosis: Right-sided low back pain with right-sided sciatica - Plan: PT plan of care cert/re-cert     Problem List Patient Active Problem List   Diagnosis Date Noted  . Allergic rhinitis 01/27/2015  . Anxiety 01/27/2015  . Back pain, chronic 01/27/2015  . Clinical depression 01/27/2015  . Acid reflux 01/27/2015  .  HLD (hyperlipidemia) 01/27/2015  . BP (high blood pressure) 01/27/2015  . Cannot sleep 01/27/2015  . 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 [place], [address] Phone: [phone]   Fax:  [phone]  Name: Jocelyn Harris MRN: [id number] Date of Birth: [birth year]

## 2015-10-18 NOTE — Patient Instructions (Signed)
All exercises provided were adapted from [email or web address]. Patient was provided a written handout with pictures as described. Any additional cues were manually entered in to handout and copied in to this document.  Piriformis Stretch  Flex hip up towards body and pull across body towards opposite side.   SEATED PIRIFORMIS STRETCH  While sitting in a chair, cross your leg with the ankle of one foot on the knee of the other.  Next, pull the top knee upward towards your opposite shoulder for a stretch.   SCIATIC NERVE GLIDE - SEATED - 2  Start by sitting flexed forward at your trunk and head as shown. Next, extend your knee as you extend your spine and head into a straight seated posture. Then return to starting position and repeat.

## 2015-10-18 NOTE — Progress Notes (Signed)
Patient ID: Jocelyn Harris, female   DOB: 07-02-1950, 65 y.o.   MRN: [id number]       Patient: Jocelyn Harris Female    DOB: 1950/08/05   65 y.o.   MRN: [id number] Visit Date: 10/18/2015  Today's Provider: Wilhemena Durie, MD   Chief Complaint  Patient presents with  . Hand Pain    since yesterday.    Subjective:    HPI Patient reports that she came back from the beach yesterday and she tripped and fell on her left side about 4 days ago. She reports that she has swelling and pain in her left hand. She reports that she also feels that her pointer finger on her left hand could be broken. Patient reports that she has been taking Ibuprofen to help with the pain. She also mentions that the pain is starting to radiate up her left arm.     Allergies  Allergen Reactions  . Doxycycline Nausea Only and Nausea And Vomiting   Previous Medications   BUSPIRONE (BUSPAR) 10 MG TABLET    Take 1 tablet (10 mg total) by mouth 3 (three) times daily.   CALCIUM GLUCONATE 500 MG TABLET    Take by mouth.   CHOLECALCIFEROL 1000 UNITS CAPSULE    Take by mouth.   CONJUGATED ESTROGENS (PREMARIN) VAGINAL CREAM    Place 1 Applicatorful vaginally daily.   CYCLOBENZAPRINE (FLEXERIL) 10 MG TABLET    Take 10 mg by mouth 3 (three) times daily as needed for muscle spasms.   EQ ALLERGY RELIEF 180 MG TABLET    TAKE ONE TABLET BY MOUTH ONCE DAILY   FLUTICASONE (FLONASE) 50 MCG/ACT NASAL SPRAY    Place 2 sprays into both nostrils daily.   IBUPROFEN (ADVIL,MOTRIN) 800 MG TABLET    Take 1 tablet (800 mg total) by mouth every 8 (eight) hours as needed.   LISINOPRIL (PRINIVIL,ZESTRIL) 10 MG TABLET    Take 1 tablet (10 mg total) by mouth daily.   MONTELUKAST (SINGULAIR) 10 MG TABLET    Take 1 tablet (10 mg total) by mouth at bedtime.   MULTIPLE VITAMINS PO    Take by mouth.   PREDNISONE (STERAPRED UNI-PAK 21 TAB) 5 MG (21) TBPK TABLET    Take 1 tablet (5 mg total) by mouth daily. As directed 6 day taper   RANITIDINE  (ZANTAC) 150 MG TABLET    Take 1 tablet (150 mg total) by mouth 2 (two) times daily.   TRAZODONE (DESYREL) 150 MG TABLET    Take 1 tablet (150 mg total) by mouth at bedtime.   VITAMIN E 100 UNIT CAPSULE    Take by mouth.    Review of Systems  Constitutional: Negative.   Musculoskeletal: Positive for arthralgias. Negative for myalgias, back pain, joint swelling, gait problem, neck pain and neck stiffness.  Skin: Negative for color change, pallor and rash.       Patient has abrasions on both knees, left wrist, and left hand.   Neurological: Positive for numbness. Negative for dizziness, tremors, seizures, syncope, facial asymmetry, speech difficulty, weakness, light-headedness and headaches.    Social History  Substance Use Topics  . Smoking status: Never Smoker   . Smokeless tobacco: Never Used  . Alcohol Use: 0.0 oz/week    0 Standard drinks or equivalent per week   Objective:   BP 128/70 mmHg  Pulse 68  Temp(Src) 98.5 F (36.9 C)  Resp 16  Ht 5\' 1"  (1.549 m)  Wt 120 lb (54.432  kg)  BMI 22.69 kg/m2  Physical Exam  Constitutional: She is oriented to person, place, and time. She appears well-developed and well-nourished.  HENT:  Head: Normocephalic and atraumatic.  Right Ear: External ear normal.  Left Ear: External ear normal.  Nose: Nose normal.  Musculoskeletal: Normal range of motion. She exhibits tenderness.  Patient experiences tenderness when palpating left metacarpal joint. Normal hand grips bilaterally. No snuffbox tenderness.  Neurological: She is alert and oriented to person, place, and time.  Skin: Skin is warm and dry.  Psychiatric: She has a normal mood and affect. Her behavior is normal. Judgment and thought content normal.        Assessment & Plan:     1. Hand pain, left due to trauma New problem. F/U pending xray results.  - DG Hand Complete Left; Future  2. Wrist pain, acute, left Secondary to a fall. Recommended patient to take an  anti-inflammatory such as Ibuprofen three times a day to help with pain. Wrist splint will also be helpful.  - DG Wrist Complete Left; Future      I have done the exam and reviewed the above chart and it is accurate to the best of my knowledge.  Nyjae Hodge Cranford Mon, MD  [facility]

## 2015-10-18 NOTE — Patient Instructions (Signed)
Take Ibuprofen OTC three times a day for pain. Recommend a wrist splint to help immobilize wrist.

## 2015-10-20 ENCOUNTER — Encounter: Payer: BLUE CROSS/BLUE SHIELD | Admitting: Physical Therapy

## 2015-10-25 ENCOUNTER — Ambulatory Visit: Payer: BLUE CROSS/BLUE SHIELD | Admitting: Physical Therapy

## 2015-10-25 DIAGNOSIS — M5441 Lumbago with sciatica, right side: Secondary | ICD-10-CM

## 2015-10-25 NOTE — Therapy (Signed)
[place] [facility] [address], [place], [address] Phone: [phone]   Fax:  [phone]  Physical Therapy Treatment  Patient Details  Name: Jocelyn Harris MRN: [id number] Date of Birth: Nov 06, 1950 No Data Recorded  Encounter Date: 10/25/2015      PT End of Session - 10/25/15 1059    Visit Number 2   Number of Visits 9   Date for PT Re-Evaluation 11/15/15   PT Start Time 1036   PT Stop Time 1116   PT Time Calculation (min) 40 min   Activity Tolerance Patient tolerated treatment well   Behavior During Therapy [facility] for tasks assessed/performed      No past medical history on file.  Past Surgical History  Procedure Laterality Date  . Tubal ligation    . Knee surgery Right   . Toe surgery      There were no vitals filed for this visit.      Subjective Assessment - 10/25/15 1037    Subjective Patient reports she had some tingling sensation in her feet yesterday. She has had pain with reaching forward/bending to touch her shoes, She reports no real changes since evaluation, she has been diligent/compliant with her HEP.    Pertinent History Denies blurry vision, no change in weight. She would like to return to the gym, she thinks she may have hurt her back while at the gym a few years ago.    Limitations Lifting;Walking;Standing   Patient Stated Goals To return to full work duties. (Cold seems to help).    Currently in Pain? Yes   Pain Score --  Reports it is hurting a little bit, points to mid lumbar spine radiating laterally bilaterally.  Reports as mild   Pain Onset More than a month ago   Pain Frequency Intermittent   Aggravating Factors  Standing for prolonged periods, bending forwards to tie her shoes.         Sit to stand training for thigh/gluteal strengthening with 4# DB in RUE, 5# DB in LUE x 10 repetitions (felt in mid part of her back) decreased to 4# DB (felt in same spot with some tingling  going into her legs)  Supine bridging (felt in hamstrings/thighs/glutes) x 10, x 12 , x 12 with fatigue noted in gluteals   Sidelying plank with knees down (easy) with R side down, progressed to repetitions of going into side plank position (felt challenge in obliques and glutes x 10 repetitions for 2 sets (required rest break as she began to fatigue in core/gluteal musculature) Rested 1 minute between each bout   Re-attempted sit to stand with 4# DB x 10 repetitions (did not feel in back just in quads) x 5#/4# DBs (felt in back after 10 repetitions)   TRX lunges (retro to target quads/glutes) x 15 bilaterally for 2 sets with cuing to decrease depth to reduce minor sensation she has in lower back. (increased sensation in lower back with increasing repetition #).   OMEGA rows - 20# x 15 repetitions for 2 sets (felt in posterior shoulders)   O<MEGA single arm rows x 10# x 20 repetitions for 2 sets ( 15 reps second set)                           PT Education - 10/25/15 1129    Education provided Yes   Education Details That she will need to complete strength and  endurance training for anterior core and posterior hips to reduce reliance on lower back musculature in standing.    Person(s) Educated Patient   Methods Explanation;Demonstration;Handout   Comprehension Verbalized understanding;Returned demonstration             PT Long Term Goals - 10/18/15 1308    PT LONG TERM GOAL #1   Title Patient will be able to stand for her 8 hour shift at work with no increased symptoms.    Baseline Difficulty completing shifts due to pain    Time 4   Period Weeks   Status New   PT LONG TERM GOAL #2   Title Patient will demonstrate full AROM into trunk flexion/extension with no increase in pain to demonstrate ability to complete work related tasks.    Baseline Pain at end range flexion/extension    Time 4   Period Weeks   Status New   PT LONG TERM GOAL #3   Title Patient  will report no pain for at least 3 consecutive days to demonstrate improved ability to complete ADLs.    Time 4   Period Weeks   Status New   PT LONG TERM GOAL #4   Title Patient will report a modified ODI score of less than 25% disability to demonstrate improved tolerance for work related activities.    Baseline 36%   Time 4   Period Weeks   Status New               Plan - 10/25/15 1130    Clinical Impression Statement Per patient her complaints of pain appear to be two fold, she certainly has decreased LE strength with lumbar extensor compensatory actions (displayed in weighted sit to stands) she also seems to have poor movement strategies and intolerance for flexion currently. She reports feeling much better after ther-ex targeting gluteal musculature, though she still has trouble with flexion at conclusion of session.    Rehab Potential Good   Clinical Impairments Affecting Rehab Potential Multiple successful resolutions of prior episodes.    PT Frequency 2x / week   PT Duration 4 weeks   PT Treatment/Interventions Cryotherapy;Electrical Stimulation;Therapeutic exercise;Therapeutic activities;Manual techniques;Dry needling   PT Next Visit Plan LE strengthening/endurance related work targeting posterior hip musculature, anterior core.   PT Home Exercise Plan HS stretch, glute bridging, side planks, lunges.    Consulted and Agree with Plan of Care Patient      Patient will benefit from skilled therapeutic intervention in order to improve the following deficits and impairments:  Pain, Impaired perceived functional ability, Decreased strength, Decreased balance  Visit Diagnosis: Right-sided low back pain with right-sided sciatica     Problem List Patient Active Problem List   Diagnosis Date Noted  . Allergic rhinitis 01/27/2015  . Anxiety 01/27/2015  . Back pain, chronic 01/27/2015  . Clinical depression 01/27/2015  . Acid reflux 01/27/2015  . HLD (hyperlipidemia)  01/27/2015  . BP (high blood pressure) 01/27/2015  . Cannot sleep 01/27/2015  . 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 [address] [place], [address] Phone: [phone]   Fax:  [phone]  Name: Jocelyn Harris MRN: [id number] Date of Birth: 12-Nov-1950

## 2015-10-27 ENCOUNTER — Ambulatory Visit: Payer: BLUE CROSS/BLUE SHIELD | Admitting: Physical Therapy

## 2015-10-27 DIAGNOSIS — M5441 Lumbago with sciatica, right side: Secondary | ICD-10-CM | POA: Diagnosis not present

## 2015-10-27 NOTE — Therapy (Signed)
[place] [facility] [address], [place], [address] Phone: [phone]   Fax:  [phone]  Physical Therapy Treatment  Patient Details  Name: Jocelyn Harris MRN: [id number] Date of Birth: 07-Jul-1950 No Data Recorded  Encounter Date: 10/27/2015      PT End of Session - 10/27/15 1043    Visit Number 3   Number of Visits 9   Date for PT Re-Evaluation 11/15/15   PT Start Time 0959   PT Stop Time 1042   PT Time Calculation (min) 43 min   Activity Tolerance Patient tolerated treatment well   Behavior During Therapy [facility] for tasks assessed/performed      No past medical history on file.  Past Surgical History  Procedure Laterality Date  . Tubal ligation    . Knee surgery Right   . Toe surgery      There were no vitals filed for this visit.      Subjective Assessment - 10/27/15 1017    Subjective Patient reports she had some tingling going into her feet, she reports feeling some better and that the piriformis stretch has been helpful as well. She has been fairly consistent with HEP, but still reports pain radiating laterally from her spine.    Pertinent History Denies blurry vision, no change in weight. She would like to return to the gym, she thinks she may have hurt her back while at the gym a few years ago.    Limitations Lifting;Walking;Standing   Patient Stated Goals To return to full work duties. (Cold seems to help).    Currently in Pain? Yes   Pain Score --  Patient reports mild pain across the lumbar spine radiating laterally    Pain Location Back   Pain Orientation Left;Right;Lower   Pain Descriptors / Indicators Aching   Pain Onset More than a month ago   Pain Frequency Intermittent   Aggravating Factors  Standing for long periods and bending forward to tie her shoes.      Standing trunk extension over foam roller 2 bouts x 45" (patient reported relief over area in mid back that was radiating  pain, though not dissipated)  Piriformis stretching on foam roller felt more on her L side than the R side 2 bouts each x 30"  Prone press ups (added over pressure) 2 bouts x 8 repetitions for 5-10" holds over central spine area where she was complaining of symptoms which appeared to lead to some relief.   Rotational  Mobilizations 4 bouts in grade III position without complaints, appeared to help dissipate tenderness over gluteal regions.   Sit to stand assessment (leaning anteriorly initially, cuing for sitting back to black mat table progressed to 5# DB) Considerable valgus noted and extensive education provided to hip hinge, shift weight posteriorly  Side stepping with yellow t-band 2 bouts x 10 repetitions with appropriate activation of posterior hip musculature.   Deadlift technique with 8# DB extensive cuing for hip hinging, maintaining neutral alignment of LEs/spine, keeping weight close to her COM.                             PT Education - 10/27/15 1043    Education provided Yes   Education Details Progression to squatting, deadlifting and how they showed weakness in posterior hip musculature.    Person(s) Educated Patient   Methods Explanation;Demonstration;Handout   Comprehension Returned demonstration;Verbalized understanding  PT Long Term Goals - 10/18/15 1308    PT LONG TERM GOAL #1   Title Patient will be able to stand for her 8 hour shift at work with no increased symptoms.    Baseline Difficulty completing shifts due to pain    Time 4   Period Weeks   Status New   PT LONG TERM GOAL #2   Title Patient will demonstrate full AROM into trunk flexion/extension with no increase in pain to demonstrate ability to complete work related tasks.    Baseline Pain at end range flexion/extension    Time 4   Period Weeks   Status New   PT LONG TERM GOAL #3   Title Patient will report no pain for at least 3 consecutive days to demonstrate  improved ability to complete ADLs.    Time 4   Period Weeks   Status New   PT LONG TERM GOAL #4   Title Patient will report a modified ODI score of less than 25% disability to demonstrate improved tolerance for work related activities.    Baseline 36%   Time 4   Period Weeks   Status New               Plan - 10/27/15 1043    Clinical Impression Statement Patient demonstrates hinge point in lumbar spine during bending/lifting tasks and will require being addressed with core stabilization in supine progressing to standing. She also shows tendency to lean anteriorly and have valgus demonstrating poor posterior chain strength and reliance on lumbar extensors and quadriceps.    Rehab Potential Good   Clinical Impairments Affecting Rehab Potential Multiple successful resolutions of prior episodes.    PT Frequency 2x / week   PT Duration 4 weeks   PT Treatment/Interventions Cryotherapy;Electrical Stimulation;Therapeutic exercise;Therapeutic activities;Manual techniques;Dry needling   PT Next Visit Plan LE strengthening/endurance related work targeting posterior hip musculature, anterior core.   PT Home Exercise Plan HS stretch, glute bridging, side planks, lunges.    Consulted and Agree with Plan of Care Patient      Patient will benefit from skilled therapeutic intervention in order to improve the following deficits and impairments:  Pain, Impaired perceived functional ability, Decreased strength, Decreased balance  Visit Diagnosis: Right-sided low back pain with right-sided sciatica     Problem List Patient Active Problem List   Diagnosis Date Noted  . Allergic rhinitis 01/27/2015  . Anxiety 01/27/2015  . Back pain, chronic 01/27/2015  . Clinical depression 01/27/2015  . Acid reflux 01/27/2015  . HLD (hyperlipidemia) 01/27/2015  . BP (high blood pressure) 01/27/2015  . Cannot sleep 01/27/2015  . 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 [place], [address] Phone: [phone]   Fax:  [phone]  Name: Jocelyn Harris MRN: [id number] Date of Birth: June 19, 1950

## 2015-10-31 ENCOUNTER — Ambulatory Visit: Payer: BLUE CROSS/BLUE SHIELD | Admitting: Physical Therapy

## 2015-11-08 ENCOUNTER — Ambulatory Visit: Payer: BLUE CROSS/BLUE SHIELD | Admitting: Physical Therapy

## 2015-11-10 ENCOUNTER — Ambulatory Visit: Payer: BLUE CROSS/BLUE SHIELD | Admitting: Physical Therapy

## 2015-11-10 DIAGNOSIS — M5441 Lumbago with sciatica, right side: Secondary | ICD-10-CM

## 2015-11-10 NOTE — Therapy (Signed)
[facility] [address] [place], [address] Phone: [phone]   Fax:  [phone]  Physical Therapy Treatment  Patient Details  Name: Jocelyn Harris MRN: [id number] Date of Birth: 10-Sep-1950 No Data Recorded  Encounter Date: 11/10/2015      PT End of Session - 11/10/15 1527    Visit Number 4   Number of Visits 9   Date for PT Re-Evaluation 11/15/15   PT Start Time [id number]   PT Stop Time 1415   PT Time Calculation (min) 30 min   Activity Tolerance Patient tolerated treatment well   Behavior During Therapy [facility] for tasks assessed/performed      No past medical history on file.  Past Surgical History  Procedure Laterality Date  . Tubal ligation    . Knee surgery Right   . Toe surgery      There were no vitals filed for this visit.      Subjective Assessment - 11/10/15 1350    Subjective Patient reports she has noticed nearly 50% improvement in her symptoms since the start of PT. She would like to begin walking program and going to the gym with PT clearance. She reports she some pain today in lumar spine radiating to anterior thigh.    Pertinent History Denies blurry vision, no change in weight. She would like to return to the gym, she thinks she may have hurt her back while at the gym a few years ago.    Limitations Lifting;Walking;Standing   Patient Stated Goals To return to full work duties. (Cold seems to help).    Currently in Pain? Yes   Pain Onset More than a month ago        Half kneeling hip flexor stretching 2 sets x 15 repetitions (patient noted complete resolution of the symptoms she initially presented for).   HS Curls on OMEGA machine 20# x 15 for 2 sets   KB deadlift with 20#, progressed to 10# DB to simulate her home weights. Cuing for pushing her hips posteriorly as she was doing more of a squat with knees over her toes. With cuing she is able to perform deadlift correctly, she  has straight back, shoulders over weight, weight close throughout movement, and hips flexed to neutral. Cuing to push hips back and keep chest up.                           PT Education - 11/10/15 1526    Education provided Yes   Education Details Educated on Midwife, program to begin exercising at the gym.    Person(s) Educated Patient   Methods Explanation;Demonstration;Handout   Comprehension Returned demonstration;Verbalized understanding             PT Long Term Goals - 10/18/15 1308    PT LONG TERM GOAL #1   Title Patient will be able to stand for her 8 hour shift at work with no increased symptoms.    Baseline Difficulty completing shifts due to pain    Time 4   Period Weeks   Status New   PT LONG TERM GOAL #2   Title Patient will demonstrate full AROM into trunk flexion/extension with no increase in pain to demonstrate ability to complete work related tasks.    Baseline Pain at end range flexion/extension    Time 4   Period Weeks   Status New   PT LONG  TERM GOAL #3   Title Patient will report no pain for at least 3 consecutive days to demonstrate improved ability to complete ADLs.    Time 4   Period Weeks   Status New   PT LONG TERM GOAL #4   Title Patient will report a modified ODI score of less than 25% disability to demonstrate improved tolerance for work related activities.    Baseline 36%   Time 4   Period Weeks   Status New               Plan - 11/10/15 1527    Clinical Impression Statement Patient has been progressing well with therapy and reports she can stand longer and has less intense symptoms than initially. She responds quite well to hip flexor stretching today, reporting resolution of symptoms she presented with today. She would benefit from a strengthening program for posterior chain, initiated in this session and tolerated well.    Rehab Potential Good   Clinical Impairments Affecting Rehab Potential Multiple  successful resolutions of prior episodes.    PT Frequency 2x / week   PT Duration 4 weeks   PT Treatment/Interventions Cryotherapy;Electrical Stimulation;Therapeutic exercise;Therapeutic activities;Manual techniques;Dry needling   PT Next Visit Plan Core strengthening and posterior chain strengthening.    PT Home Exercise Plan HS stretch, glute bridging, side planks, lunges.    Consulted and Agree with Plan of Care Patient      Patient will benefit from skilled therapeutic intervention in order to improve the following deficits and impairments:  Pain, Impaired perceived functional ability, Decreased strength, Decreased balance  Visit Diagnosis: Right-sided low back pain with right-sided sciatica     Problem List Patient Active Problem List   Diagnosis Date Noted  . Allergic rhinitis 01/27/2015  . Anxiety 01/27/2015  . Back pain, chronic 01/27/2015  . Clinical depression 01/27/2015  . Acid reflux 01/27/2015  . HLD (hyperlipidemia) 01/27/2015  . BP (high blood pressure) 01/27/2015  . Cannot sleep 01/27/2015  . 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 [place], [address] Phone: [phone]   Fax:  [phone]  Name: Jocelyn Harris MRN: [id number] Date of Birth: September 16, 1950

## 2015-11-15 ENCOUNTER — Ambulatory Visit: Payer: BLUE CROSS/BLUE SHIELD | Admitting: Physical Therapy

## 2015-11-17 ENCOUNTER — Ambulatory Visit: Payer: BLUE CROSS/BLUE SHIELD | Attending: Family Medicine | Admitting: Physical Therapy

## 2015-11-17 DIAGNOSIS — M5441 Lumbago with sciatica, right side: Secondary | ICD-10-CM | POA: Insufficient documentation

## 2015-11-21 NOTE — Therapy (Signed)
[facility] [address] [place], [address] Phone: [phone]   Fax:  [phone]  Physical Therapy Treatment  Patient Details  Name: Jocelyn Harris MRN: [id number] Date of Birth: 19-Mar-1951 No Data Recorded  Encounter Date: 11/17/2015      PT End of Session - 11/21/15 2306    Visit Number 5   Number of Visits 9   Date for PT Re-Evaluation 12/15/15   PT Start Time 1430   PT Stop Time 1515   PT Time Calculation (min) 45 min   Activity Tolerance Patient tolerated treatment well   Behavior During Therapy [facility] for tasks assessed/performed      No past medical history on file.  Past Surgical History  Procedure Laterality Date  . Tubal ligation    . Knee surgery Right   . Toe surgery      There were no vitals filed for this visit.      Subjective Assessment - 11/21/15 2308    Subjective Patient reports that she became quite painful over the middle of her lower back over the weekend from prolonged standing. She reports she thinks the intensity and time it takes to onset symptoms have improved, however she is somewhat discouraged her symptoms have not resolved yet.    Pertinent History Denies blurry vision, no change in weight. She would like to return to the gym, she thinks she may have hurt her back while at the gym a few years ago.    Limitations Lifting;Walking;Standing   Patient Stated Goals To return to full work duties. (Cold seems to help).    Currently in Pain? Yes   Pain Score --  Patient reports she is not as painful today as she was over the weekend, but does continue to have some pain across the middle of her lower back.    Pain Location Back   Pain Orientation Left;Right;Lower   Pain Descriptors / Indicators Aching   Pain Type Chronic pain   Pain Onset More than a month ago   Aggravating Factors  Standing for prolonged periods.       Sciatic nerve mobilizations 4 bouts x 10  repetitions (initially quite tender, feeling in the back of her thigh. Felt less in her R leg more in L Leg completed 4 sets both sides)   Leg Curls - 25# for 3 sets x 10 repetitions   Leg Press - 65# (1 set x 10 repetitions), 75# (2 sets x 10 repeittions)  Bird Dog exercise (cuing provided for maintaining rigid mid-section) 3 sets x 10 repetitions bilaterally   Soft tissue mobilizations over posterior thigh, which she reports decreased her symptoms afterwards. Patient educated to complete with tennis ball at home.                            PT Education - 11/21/15 2306    Education provided Yes   Education Details Bird dog exercise, encourgaed patient to go to the gym.    Person(s) Educated Patient   Methods Explanation;Demonstration   Comprehension Verbalized understanding;Returned demonstration             PT Long Term Goals - 11/17/15 1520    PT LONG TERM GOAL #1   Title Patient will be able to stand for her 8 hour shift at work with no increased symptoms.    Baseline Difficulty completing shifts due to pain  Time 4   Period Weeks   Status Partially Met   PT LONG TERM GOAL #2   Title Patient will demonstrate full AROM into trunk flexion/extension with no increase in pain to demonstrate ability to complete work related tasks.    Baseline Pain at end range flexion/extension    Time 4   Period Weeks   Status On-going   PT LONG TERM GOAL #3   Title Patient will report no pain for at least 3 consecutive days to demonstrate improved ability to complete ADLs.    Time 4   Period Weeks   Status Partially Met   PT LONG TERM GOAL #4   Title Patient will report a modified ODI score of less than 25% disability to demonstrate improved tolerance for work related activities.    Baseline 36%   Time 4   Period Weeks   Status On-going               Plan - 11/21/15 2306    Clinical Impression Statement Patient continues to present with low back pain  from prolonged standing. It appears this is likely a contribution to altered NM control, core strength/endurance deficits. She does report some improvement from baseline, however given her job requirements, more focus will need to be directed towards progressing strength/endurance.    Rehab Potential Good   Clinical Impairments Affecting Rehab Potential Multiple successful resolutions of prior episodes.    PT Frequency 2x / week   PT Duration 4 weeks   PT Treatment/Interventions Cryotherapy;Electrical Stimulation;Therapeutic exercise;Therapeutic activities;Manual techniques;Dry needling   PT Next Visit Plan Core strengthening and posterior chain strengthening.    PT Home Exercise Plan HS stretch, glute bridging, side planks, lunges.    Consulted and Agree with Plan of Care Patient      Patient will benefit from skilled therapeutic intervention in order to improve the following deficits and impairments:  Pain, Impaired perceived functional ability, Decreased strength, Decreased balance  Visit Diagnosis: Right-sided low back pain with right-sided sciatica     Problem List Patient Active Problem List   Diagnosis Date Noted  . Allergic rhinitis 01/27/2015  . Anxiety 01/27/2015  . Back pain, chronic 01/27/2015  . Clinical depression 01/27/2015  . Acid reflux 01/27/2015  . HLD (hyperlipidemia) 01/27/2015  . BP (high blood pressure) 01/27/2015  . Cannot sleep 01/27/2015  . 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 [place], [address] Phone: [phone]   Fax:  [phone]  Name: RASHIDA LADOUCEUR MRN: [id number] Date of Birth: 04-Sep-1950

## 2015-11-22 ENCOUNTER — Ambulatory Visit: Payer: BLUE CROSS/BLUE SHIELD | Admitting: Physical Therapy

## 2015-11-22 DIAGNOSIS — M5441 Lumbago with sciatica, right side: Secondary | ICD-10-CM

## 2015-11-22 NOTE — Therapy (Signed)
[facility] [address], [place], [address] Phone: [phone]   Fax:  [phone]  Physical Therapy Treatment  Patient Details  Name: Jocelyn Harris MRN: [id number] Date of Birth: [birth year] No Data Recorded  Encounter Date: 11/22/2015      PT End of Session - 11/22/15 0845    Visit Number 6   Number of Visits 9   Date for PT Re-Evaluation 12/15/15   PT Start Time 0803   PT Stop Time 0844   PT Time Calculation (min) 41 min   Activity Tolerance Patient tolerated treatment well   Behavior During Therapy [facility] for tasks assessed/performed      No past medical history on file.  Past Surgical History  Procedure Laterality Date  . Tubal ligation    . Knee surgery Right   . Toe surgery      There were no vitals filed for this visit.      Subjective Assessment - 11/22/15 0803    Subjective Patient reports her pain in hip has reduced and is more tolerable, she continues to point to thoraco-lumbar junction and across bilaterally, particularly first thing in the morning (described as stiffness).    Pertinent History Denies blurry vision, no change in weight. She would like to return to the gym, she thinks she may have hurt her back while at the gym a few years ago.    Limitations Lifting;Walking;Standing   Patient Stated Goals To return to full work duties. (Cold seems to help).    Currently in Pain? Yes   Pain Score --  Patient reports as stiffness, reduced from earlier this morning.    Pain Location Back   Pain Orientation Right;Mid;Lower   Pain Descriptors / Indicators Tightness   Pain Type Chronic pain   Pain Onset More than a month ago   Pain Frequency Intermittent   Aggravating Factors  Standing for prolonged periods.       Trunk extension - near full expected range no pain increase. Trunk flexion roughly 50% limited and reproductive of her pain.   Seated thoracic rotations 4 sets of  oscillations at end range for 20", reported feeling more stretching sensation when rotating to her L than her R.  Soft tissue mobilization (much more tender over R flank from L3 to T10 in paraspinals and adjacent flank musculature than L side, reduced significantly with STM with concurrent reports of decreased stiffness and pain in standing.   Half kneeling hip flexor stretch x 12 for 2 sets on RLE (based on location of pain/stiffness PT theorized hip flexor tightness/dysfunction may be contributing to symptoms). Patient reported significant decline in pain and stiffness from time finishing STM as well, near complete reduction in symptoms, increased ROM into trunk flexion.   Educated patient on using tennis ball to provide massage to her QL/tender area while at work or at home when symptomatic.                            PT Education - 11/22/15 0845    Education provided Yes   Education Details Perform tennis ball massage work, hip flexor stretch, again encouraged patient to go to the gym.    Person(s) Educated Patient   Methods Explanation;Handout   Comprehension Verbalized understanding             PT Long Term Goals - 11/17/15 1520    PT LONG TERM  GOAL #1   Title Patient will be able to stand for her 8 hour shift at work with no increased symptoms.    Baseline Difficulty completing shifts due to pain    Time 4   Period Weeks   Status Partially Met   PT LONG TERM GOAL #2   Title Patient will demonstrate full AROM into trunk flexion/extension with no increase in pain to demonstrate ability to complete work related tasks.    Baseline Pain at end range flexion/extension    Time 4   Period Weeks   Status On-going   PT LONG TERM GOAL #3   Title Patient will report no pain for at least 3 consecutive days to demonstrate improved ability to complete ADLs.    Time 4   Period Weeks   Status Partially Met   PT LONG TERM GOAL #4   Title Patient will report a  modified ODI score of less than 25% disability to demonstrate improved tolerance for work related activities.    Baseline 36%   Time 4   Period Weeks   Status On-going               Plan - 11/22/15 0851    Clinical Impression Statement Patient appears to benefit from hip flexor stretching and soft tissue mobilization to R sided hip flexors/quadratus lumborum in this session. She continues to report thoraco-lumbar junction pain more on R than L, primarily exacerbated by prolonged standing. She denies questions pertaining to GI, kidney, traumatic contributing factors . Quadratus stretching and soft tissue mobilization will be performed in follow up appointment to further elucidate source of her symptoms.    Rehab Potential Good   Clinical Impairments Affecting Rehab Potential Multiple successful resolutions of prior episodes.    PT Frequency 2x / week   PT Duration 4 weeks   PT Treatment/Interventions Cryotherapy;Electrical Stimulation;Therapeutic exercise;Therapeutic activities;Manual techniques;Dry needling   PT Next Visit Plan Core strengthening and posterior chain strengthening. Soft tissue mobilization, hip flexor stretching, QL stretching.    PT Home Exercise Plan Tennis ball massage/mobilization on wall, hip flexor stretching in half kneeling.    Consulted and Agree with Plan of Care Patient      Patient will benefit from skilled therapeutic intervention in order to improve the following deficits and impairments:  Pain, Impaired perceived functional ability, Decreased strength, Decreased balance  Visit Diagnosis: Right-sided low back pain with right-sided sciatica     Problem List Patient Active Problem List   Diagnosis Date Noted  . Allergic rhinitis 01/27/2015  . Anxiety 01/27/2015  . Back pain, chronic 01/27/2015  . Clinical depression 01/27/2015  . Acid reflux 01/27/2015  . HLD (hyperlipidemia) 01/27/2015  . BP (high blood pressure) 01/27/2015  . Cannot sleep  01/27/2015  . 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 [place], [address] Phone: [phone]   Fax:  [phone]  Name: Jocelyn Harris MRN: [id number] Date of Birth: 02/06/1951

## 2015-11-24 ENCOUNTER — Ambulatory Visit: Payer: BLUE CROSS/BLUE SHIELD | Admitting: Physical Therapy

## 2015-11-24 DIAGNOSIS — M5441 Lumbago with sciatica, right side: Secondary | ICD-10-CM | POA: Diagnosis not present

## 2015-11-25 NOTE — Therapy (Signed)
[facility] [address], [place], [address] Phone: [phone]   Fax:  [phone]  Physical Therapy Treatment  Patient Details  Name: Jocelyn Harris MRN: [id number] Date of Birth: Jul 24, 1950 No Data Recorded  Encounter Date: 11/24/2015      PT End of Session - 11/25/15 1353    Visit Number 7   Number of Visits 9   Date for PT Re-Evaluation 12/15/15   PT Start Time 2952   PT Stop Time 1600   PT Time Calculation (min) 45 min   Activity Tolerance Patient tolerated treatment well   Behavior During Therapy [facility] for tasks assessed/performed      No past medical history on file.  Past Surgical History  Procedure Laterality Date  . Tubal ligation    . Knee surgery Right   . Toe surgery      There were no vitals filed for this visit.      Subjective Assessment - 11/24/15 1537    Subjective Patient reporting she is having stiffness/tightness again this morning. She reports some decrease in symptoms over the last few weeks, though continues to have pain/stiffness in lumbo-pelvic region, particularly after prolonged standing.    Pertinent History Denies blurry vision, no change in weight. She would like to return to the gym, she thinks she may have hurt her back while at the gym a few years ago.    Limitations Lifting;Walking;Standing   Patient Stated Goals To return to full work duties. (Cold seems to help).    Currently in Pain? Yes   Pain Score --  Patient reports stiffness/soreness in lumbar spine, posterior hip.    Pain Location Back   Pain Orientation Right;Mid;Lower   Pain Descriptors / Indicators Tightness   Pain Type Chronic pain   Pain Onset More than a month ago   Pain Frequency Intermittent   Aggravating Factors  Standing for prolonged periods, bending forwards.       Prone on elbows extended 2 bouts of 4 minutes total of 10 seconds on, 10 seconds off.  (Reported decrease/abolishment of  lumbar symptoms, top of hips remained the same)   Double knees to chest 2 sets x 10 repetitions with 3" holds  (patient reported decrease in most symptoms after this).   Planks 3 reps x 5"  (HEP) -- for 2 sets, cued to have lower back in lower position, did feel in lumbar extensors, though not painful.   Side stepping with blue t-band x 8 with encouragement for larger steps (appropriate activation). (HEP)                                 PT Long Term Goals - 11/17/15 1520    PT LONG TERM GOAL #1   Title Patient will be able to stand for her 8 hour shift at work with no increased symptoms.    Baseline Difficulty completing shifts due to pain    Time 4   Period Weeks   Status Partially Met   PT LONG TERM GOAL #2   Title Patient will demonstrate full AROM into trunk flexion/extension with no increase in pain to demonstrate ability to complete work related tasks.    Baseline Pain at end range flexion/extension    Time 4   Period Weeks   Status On-going   PT LONG TERM GOAL #3   Title Patient will report no  pain for at least 3 consecutive days to demonstrate improved ability to complete ADLs.    Time 4   Period Weeks   Status Partially Met   PT LONG TERM GOAL #4   Title Patient will report a modified ODI score of less than 25% disability to demonstrate improved tolerance for work related activities.    Baseline 36%   Time 4   Period Weeks   Status On-going               Plan - 11/25/15 1353    Clinical Impression Statement Patient reports significant decrease in pain/stiffness after performing trunkal extensions and hip flexion double knee to chest. Patient may benefit from core stabilization program, provided with plank exercises and band resisted side stepping to incrase lumbo-pelvic stability and will monitor for improvement in symptoms.    Rehab Potential Good   Clinical Impairments Affecting Rehab Potential Multiple successful resolutions of  prior episodes.    PT Frequency 2x / week   PT Duration 4 weeks   PT Treatment/Interventions Cryotherapy;Electrical Stimulation;Therapeutic exercise;Therapeutic activities;Manual techniques;Dry needling   PT Next Visit Plan Core strengthening and posterior chain strengthening. Soft tissue mobilization, hip flexor stretching, QL stretching.    PT Home Exercise Plan Tennis ball massage/mobilization on wall, hip flexor stretching in half kneeling.    Consulted and Agree with Plan of Care Patient      Patient will benefit from skilled therapeutic intervention in order to improve the following deficits and impairments:  Pain, Impaired perceived functional ability, Decreased strength, Decreased balance  Visit Diagnosis: Right-sided low back pain with right-sided sciatica     Problem List Patient Active Problem List   Diagnosis Date Noted  . Allergic rhinitis 01/27/2015  . Anxiety 01/27/2015  . Back pain, chronic 01/27/2015  . Clinical depression 01/27/2015  . Acid reflux 01/27/2015  . HLD (hyperlipidemia) 01/27/2015  . BP (high blood pressure) 01/27/2015  . Cannot sleep 01/27/2015  . 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 [place], [address] Phone: [phone]   Fax:  [phone]  Name: Jocelyn Harris MRN: [id number] Date of Birth: Apr 11, 1951

## 2015-11-29 ENCOUNTER — Ambulatory Visit: Payer: BLUE CROSS/BLUE SHIELD | Admitting: Physical Therapy

## 2015-11-29 DIAGNOSIS — M5441 Lumbago with sciatica, right side: Secondary | ICD-10-CM | POA: Diagnosis not present

## 2015-11-29 NOTE — Therapy (Signed)
[place] [facility] [address] [place], [address] Phone: [phone]   Fax:  [phone]  Physical Therapy Treatment  Patient Details  Name: Jocelyn Harris MRN: [id number] Date of Birth: May 25, 1951 No Data Recorded  Encounter Date: 11/29/2015      PT End of Session - 11/29/15 1232    Visit Number 8   Number of Visits 9   Date for PT Re-Evaluation 12/15/15   PT Start Time 1120   PT Stop Time 1201   PT Time Calculation (min) 41 min   Activity Tolerance Patient tolerated treatment well   Behavior During Therapy [facility] for tasks assessed/performed      No past medical history on file.  Past Surgical History  Procedure Laterality Date  . Tubal ligation    . Knee surgery Right   . Toe surgery      There were no vitals filed for this visit.      Subjective Assessment - 11/29/15 1121    Subjective Patient reports she has improved with therapy but continues to have pain across the mid-line of her lower back first thing in the morning. She reports stretching has helped and she has not had pain going down into her lower leg as she had previously. Denies any other constitutional signs.    Pertinent History Denies blurry vision, no change in weight. She would like to return to the gym, she thinks she may have hurt her back while at the gym a few years ago.    Limitations Lifting;Walking;Standing   Patient Stated Goals To return to full work duties. (Cold seems to help).    Currently in Pain? Yes   Pain Score 4    Pain Location Back   Pain Orientation Left;Right;Lower   Pain Descriptors / Indicators Tightness   Pain Type Chronic pain   Pain Onset More than a month ago   Pain Frequency Intermittent   Aggravating Factors  Worst first thing in the morning    Pain Relieving Factors Stretching     Soft tissue mobilization provided to gluteal musculature around mid to distal sacrum and on L side, mild palpable  tightness noted, relief noted afterwards   Lumbar rotational mobilizations grade III bilaterally with reported feeling of stretching in areas of tightness in sacral region   Piriformis stretching, noted to target area of discomfort in her posterior thigh (mentioned during rotational mobs on posterior hip she had some discomfort).  On LLE x 15 for 2 sets   Lower trunk rotations x 5, educated to perform at home within comfortable range.   Patient reported she felt much less stiff/painful after leaving PT session.                              PT Education - 11/29/15 1229    Education provided Yes   Education Details Consolidated HEP into tennis ball, prone on elbows, lower trunk rotations, piriformis stretching.   Person(s) Educated Patient   Methods Explanation;Handout;Demonstration   Comprehension Verbalized understanding;Returned demonstration             PT Long Term Goals - 11/17/15 1520    PT LONG TERM GOAL #1   Title Patient will be able to stand for her 8 hour shift at work with no increased symptoms.    Baseline Difficulty completing shifts due to pain    Time 4   Period Weeks  Status Partially Met   PT LONG TERM GOAL #2   Title Patient will demonstrate full AROM into trunk flexion/extension with no increase in pain to demonstrate ability to complete work related tasks.    Baseline Pain at end range flexion/extension    Time 4   Period Weeks   Status On-going   PT LONG TERM GOAL #3   Title Patient will report no pain for at least 3 consecutive days to demonstrate improved ability to complete ADLs.    Time 4   Period Weeks   Status Partially Met   PT LONG TERM GOAL #4   Title Patient will report a modified ODI score of less than 25% disability to demonstrate improved tolerance for work related activities.    Baseline 36%   Time 4   Period Weeks   Status On-going               Plan - 11/29/15 1123    Clinical Impression  Statement Patient appears to demonstrate pain/discomfort more in her L hip today, though it manifests itself as discomfort in lumbar spine radiating laterally. She reports relief with piriformis stretching and lower trunk rotations, and has made improvement in symptoms overall. She reports this pain has been present for roughly 2-3 years now, but that she has seen improvement with this bout of PT.    Rehab Potential Good   Clinical Impairments Affecting Rehab Potential Multiple successful resolutions of prior episodes.    PT Frequency 2x / week   PT Duration 4 weeks   PT Treatment/Interventions Cryotherapy;Electrical Stimulation;Therapeutic exercise;Therapeutic activities;Manual techniques;Dry needling   PT Next Visit Plan Core strengthening and posterior chain strengthening. Soft tissue mobilization, hip flexor stretching, QL stretching.    PT Home Exercise Plan Tennis ball massage/mobilization on wall, hip flexor stretching in half kneeling.    Consulted and Agree with Plan of Care Patient      Patient will benefit from skilled therapeutic intervention in order to improve the following deficits and impairments:  Pain, Impaired perceived functional ability, Decreased strength, Decreased balance  Visit Diagnosis: Right-sided low back pain with right-sided sciatica     Problem List Patient Active Problem List   Diagnosis Date Noted  . Allergic rhinitis 01/27/2015  . Anxiety 01/27/2015  . Back pain, chronic 01/27/2015  . Clinical depression 01/27/2015  . Acid reflux 01/27/2015  . HLD (hyperlipidemia) 01/27/2015  . BP (high blood pressure) 01/27/2015  . Cannot sleep 01/27/2015  . 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 [place], [address] Phone: [phone]   Fax:  [phone]  Name: Jocelyn Harris MRN: [id number] Date of Birth:  [birth year]

## 2015-12-01 ENCOUNTER — Encounter: Payer: BLUE CROSS/BLUE SHIELD | Admitting: Physical Therapy

## 2015-12-05 ENCOUNTER — Ambulatory Visit: Payer: BLUE CROSS/BLUE SHIELD | Admitting: Physical Therapy

## 2015-12-05 DIAGNOSIS — M5441 Lumbago with sciatica, right side: Secondary | ICD-10-CM | POA: Diagnosis not present

## 2015-12-05 NOTE — Therapy (Signed)
[place] [facility] [address], [place], [address] Phone: [phone]   Fax:  [phone]  Physical Therapy Treatment  Patient Details  Name: Jocelyn Harris MRN: [id number] Date of Birth: 1951/04/01 No Data Recorded  Encounter Date: 12/05/2015      PT End of Session - 12/05/15 0902    Visit Number 9   Number of Visits 11   Date for PT Re-Evaluation 01/02/16   PT Start Time 0901   PT Stop Time 0943   PT Time Calculation (min) 42 min   Activity Tolerance Patient tolerated treatment well   Behavior During Therapy [facility] for tasks assessed/performed      No past medical history on file.  Past Surgical History  Procedure Laterality Date  . Tubal ligation    . Knee surgery Right   . Toe surgery      There were no vitals filed for this visit.      Subjective Assessment - 12/05/15 0921    Subjective Patient reports she has made a noticeable improvement with piriformis stretching which she is performing often. She reports she is having anterior thigh pain now after a long shift while working on her feet, but otherwise her symptoms.have reduced substantially.    Pertinent History Denies blurry vision, no change in weight. She would like to return to the gym, she thinks she may have hurt her back while at the gym a few years ago.    Limitations Lifting;Walking;Standing   Patient Stated Goals To return to full work duties. (Cold seems to help).    Currently in Pain? No/denies   Pain Onset More than a month ago      Modified ODI - 16%  Half kneeling position to complete washing tasks (patient had been squatting to complete, notable lumbar flexion at bottom position).  Educated on lumbar flexion vs hip flexion   Half kneeling hip flexor stretch - x 10 repetitions bilaterally with patient reporting significant stretch in anterior thigh (in spot she was informing PT she was experiencing pain at night after a long  day of work)  Standing quadricep stretch 1 set  X 8 repetitions (hurts so good feeling in anterior thigh)  Seated thoracic extensions - 2 sets x 10 repetitions with directions to keep hands above head and feel in thoracic spine (felt relief in the area she reported she experienced discomfort in at times)  Leg Curls 20# x 10 for 2 sets   Leg Press with cuing to have pillow behind her back, 90 degree angle at knee to initiate. 35# x 12 repetitions.  ** Quad, hip flexor, thoracic spine stretching provided as HEP for PRN pain relief.                             PT Education - 12/05/15 1726    Education provided Yes   Education Details Perform HEP while painful in specific areas (hip flexor/quad for anterior thigh, thoracic extensions in sitting for t-spine).    Person(s) Educated Patient   Methods Explanation;Handout;Demonstration   Comprehension Verbalized understanding;Returned demonstration             PT Long Term Goals - 12/05/15 0902    PT LONG TERM GOAL #1   Title Patient will be able to stand for her 8 hour shift at work with no increased symptoms.    Baseline Difficulty completing shifts due to pain. --  Baseline. Patient reports she feels much better at work over the weekend. -- 12/05/2015   Time 4   Period Weeks   Status Partially Met   PT LONG TERM GOAL #2   Title Patient will demonstrate full AROM into trunk flexion/extension with no increase in pain to demonstrate ability to complete work related tasks.    Baseline Pain at end range flexion/extension. (Pain with L side bending).    Time 4   Period Weeks   Status Achieved   PT LONG TERM GOAL #3   Title Patient will report no pain for at least 3 consecutive days to demonstrate improved ability to complete ADLs.    Baseline Pain only after prolonged standing/heavy lifting     Time 4   Period Weeks   Status Partially Met   PT LONG TERM GOAL #4   Title Patient will report a modified ODI score  of less than 25% disability to demonstrate improved tolerance for work related activities.    Baseline 36% at baseline. 16% on 12/05/2015   Time 4   Period Weeks   Status Achieved               Plan - 12/05/15 0902    Clinical Impression Statement Patient reports significant improvement with directed lumbo-pelvic stretching. Her Modified ODI has decreased from 36% disability to 16% disability. No pain with flexion/extension ROM. She has been compliant with HEP thus far, and now only has pain after prolonged standing at work. This is likely related to core/gluteal weakness/fatigue and will be addressed in follow up visits.    Rehab Potential Good   Clinical Impairments Affecting Rehab Potential Multiple successful resolutions of prior episodes.    PT Frequency 2x / week   PT Duration 4 weeks   PT Treatment/Interventions Cryotherapy;Electrical Stimulation;Therapeutic exercise;Therapeutic activities;Manual techniques;Dry needling   PT Next Visit Plan Core strengthening and posterior chain strengthening. Soft tissue mobilization, hip flexor stretching, QL stretching.    PT Home Exercise Plan Tennis ball massage/mobilization on wall, hip flexor stretching in half kneeling.    Consulted and Agree with Plan of Care Patient      Patient will benefit from skilled therapeutic intervention in order to improve the following deficits and impairments:  Pain, Impaired perceived functional ability, Decreased strength, Decreased balance  Visit Diagnosis: Right-sided low back pain with right-sided sciatica - Plan: PT plan of care cert/re-cert     Problem List Patient Active Problem List   Diagnosis Date Noted  . Allergic rhinitis 01/27/2015  . Anxiety 01/27/2015  . Back pain, chronic 01/27/2015  . Clinical depression 01/27/2015  . Acid reflux 01/27/2015  . HLD (hyperlipidemia) 01/27/2015  . BP (high blood pressure) 01/27/2015  . Cannot sleep 01/27/2015  . 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 [place], [address] Phone: [phone]   Fax:  [phone]  Name: Jocelyn Harris MRN: [id number] Date of Birth: 1951/03/10

## 2015-12-08 ENCOUNTER — Encounter: Payer: BLUE CROSS/BLUE SHIELD | Admitting: Physical Therapy

## 2015-12-13 ENCOUNTER — Ambulatory Visit: Payer: BLUE CROSS/BLUE SHIELD | Admitting: Physical Therapy

## 2015-12-15 ENCOUNTER — Ambulatory Visit: Payer: BLUE CROSS/BLUE SHIELD | Admitting: Physical Therapy

## 2015-12-15 ENCOUNTER — Encounter: Payer: BLUE CROSS/BLUE SHIELD | Admitting: Physical Therapy

## 2015-12-27 ENCOUNTER — Ambulatory Visit: Payer: BLUE CROSS/BLUE SHIELD | Attending: Family Medicine | Admitting: Physical Therapy

## 2015-12-27 DIAGNOSIS — M5441 Lumbago with sciatica, right side: Secondary | ICD-10-CM | POA: Insufficient documentation

## 2015-12-27 NOTE — Therapy (Signed)
[place] [facility] [address] [place], [address] Phone: [phone]   Fax:  [phone]  Physical Therapy Treatment  Patient Details  Name: Jocelyn Harris MRN: [id number] Date of Birth: 11-Sep-1950 No Data Recorded  Encounter Date: 12/27/2015      PT End of Session - 12/27/15 2308    Visit Number 10   Number of Visits 11   Date for PT Re-Evaluation 01/02/16   PT Start Time 5701   PT Stop Time 1055   PT Time Calculation (min) 40 min   Activity Tolerance Patient tolerated treatment well   Behavior During Therapy [facility] for tasks assessed/performed      No past medical history on file.  Past Surgical History  Procedure Laterality Date  . Tubal ligation    . Knee surgery Right   . Toe surgery      There were no vitals filed for this visit.      Subjective Assessment - 12/27/15 2304    Subjective Patient reports she has been feeling better on non-work days. She continues to have pain at work, primarily when she is working at [employer].    Pertinent History Denies blurry vision, no change in weight. She would like to return to the gym, she thinks she may have hurt her back while at the gym a few years ago.    Limitations Lifting;Walking;Standing   Patient Stated Goals To return to full work duties. (Cold seems to help).    Currently in Pain? No/denies      Lifting mechanics, twisting/turning mechanics with 5 and 10# in moving box. Patient demonstrated her lifting mechanics at work, excessive lumbar flexion noted to reach over counter to [employer]. Had patient use a step stool to gain leverage, then squat backwards to bring boxes towards her, no reports of pain on multiple trial attempts.   Squatting/deadlifting box -- again cued patient for wider stance (narrow stance initially) maintain neutral spine by showing logo on shirt, patient able to squat to box and pick up with up to 10# with no  reports of pain.   Standing rows from cable machoine x 10#  For a total of 3 sets of 10 repetitions (appropriate muscular activity noted)/   Hip thrusts off table top (mimicking height of her bed). Cuing for set up, appropriate challenge and activation. 1 set  x10 repetitions noted (cuing for pure hip extension to neutral, no lumbar flexion/extension).                             PT Education - 12/27/15 2307    Education provided Yes   Education Details Body mechanics at work to reduce lumbar flexion during work tasks.    Person(s) Educated Patient   Methods Explanation;Demonstration   Comprehension Verbalized understanding;Returned demonstration             PT Long Term Goals - 12/05/15 0902    PT LONG TERM GOAL #1   Title Patient will be able to stand for her 8 hour shift at work with no increased symptoms.    Baseline Difficulty completing shifts due to pain. -- Baseline. Patient reports she feels much better at work over the weekend. -- 12/05/2015   Time 4   Period Weeks   Status Partially Met   PT LONG TERM GOAL #2   Title Patient will demonstrate full AROM into trunk flexion/extension  with no increase in pain to demonstrate ability to complete work related tasks.    Baseline Pain at end range flexion/extension. (Pain with L side bending).    Time 4   Period Weeks   Status Achieved   PT LONG TERM GOAL #3   Title Patient will report no pain for at least 3 consecutive days to demonstrate improved ability to complete ADLs.    Baseline Pain only after prolonged standing/heavy lifting     Time 4   Period Weeks   Status Partially Met   PT LONG TERM GOAL #4   Title Patient will report a modified ODI score of less than 25% disability to demonstrate improved tolerance for work related activities.    Baseline 36% at baseline. 16% on 12/05/2015   Time 4   Period Weeks   Status Achieved               Plan - 12/27/15 2309    Clinical Impression  Statement Patient demonstrates poor lifting mechanics, exhibiting lumbar flexion in bringing dishes over the counter top. She also demonstrates lumbar rotation rather than hip rotation, likely contributing to her symptoms after work. Educated patient on proper mechanics and instructed to be cognizant at work and return for final follow up to discuss progress.    Rehab Potential Good   Clinical Impairments Affecting Rehab Potential Multiple successful resolutions of prior episodes.    PT Frequency 2x / week   PT Duration 4 weeks   PT Treatment/Interventions Cryotherapy;Electrical Stimulation;Therapeutic exercise;Therapeutic activities;Manual techniques;Dry needling   PT Next Visit Plan Core strengthening and posterior chain strengthening. Soft tissue mobilization, hip flexor stretching, QL stretching.    PT Home Exercise Plan Tennis ball massage/mobilization on wall, hip flexor stretching in half kneeling.    Consulted and Agree with Plan of Care Patient      Patient will benefit from skilled therapeutic intervention in order to improve the following deficits and impairments:  Pain, Impaired perceived functional ability, Decreased strength, Decreased balance  Visit Diagnosis: Right-sided low back pain with right-sided sciatica     Problem List Patient Active Problem List   Diagnosis Date Noted  . Allergic rhinitis 01/27/2015  . Anxiety 01/27/2015  . Back pain, chronic 01/27/2015  . Clinical depression 01/27/2015  . Acid reflux 01/27/2015  . HLD (hyperlipidemia) 01/27/2015  . BP (high blood pressure) 01/27/2015  . Cannot sleep 01/27/2015  . 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, [place], [address] Phone: [phone]   Fax:  [phone]  Name: Jocelyn Harris MRN: [id number] Date of Birth: 1951/04/28

## 2016-01-09 ENCOUNTER — Ambulatory Visit: Payer: BLUE CROSS/BLUE SHIELD | Admitting: Physical Therapy

## 2016-01-09 DIAGNOSIS — M5441 Lumbago with sciatica, right side: Secondary | ICD-10-CM | POA: Diagnosis not present

## 2016-01-09 NOTE — Therapy (Signed)
[place] [facility] [address], [place], [address] Phone: [phone]   Fax:  [phone]  Physical Therapy Treatment  Patient Details  Name: Jocelyn Harris MRN: [id number] Date of Birth: Mar 27, 1951 No Data Recorded  Encounter Date: 01/09/2016      PT End of Session - 01/09/16 1106    Visit Number 11   Number of Visits 13   Date for PT Re-Evaluation 01/23/16   PT Start Time 1033   PT Stop Time 1112   PT Time Calculation (min) 39 min   Activity Tolerance Patient tolerated treatment well   Behavior During Therapy [facility] for tasks assessed/performed      No past medical history on file.  Past Surgical History:  Procedure Laterality Date  . KNEE SURGERY Right   . TOE SURGERY    . TUBAL LIGATION      There were no vitals filed for this visit.      Subjective Assessment - 01/09/16 1035    Subjective Patient reports she was doing quite well up until yesterday. 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 Even lifting her R leg up in the shower was painful.    Pertinent History Denies blurry vision, no change in weight. She would like to return to the gym, she thinks she may have hurt her back while at the gym a few years ago.    Limitations Lifting;Walking;Standing   Patient Stated Goals To return to full work duties. (Cold seems to help).    Currently in Pain? Yes   Pain Score --  Mild R Sciatic pain today, improved from this morning      Educated patient regarding twisting/lifting mechanics, that the spine does not tolerate rotation and flexion combined with load, thus likely flaring up her symptoms. Educated patient on keeping her navel and nose in alignment to promote hip rotation.   R sciatic n flossing 3 sets x 10 repetitions with PT providing overpressure  -- felt pain  initially upon standing which reduced with additional stepping (less pain during gait than when she walked in).   Prone soft tissue mobilization over gluteal insertion -- twitch response x 3, appeared to reduce with prolonged exposure.   Prone on elbows x 8 repetitions   Tennis ball soft tissue mobilization x 45" for 2 bouts for patient to complete at home. Patient reported significant reduction in symptoms after use of tennis ball and other exercises provided in this session.                             PT Education - 01/09/16 1058    Education provided Yes   Education Details Sciatic type of pain typically resolves on its own. Be careful with lifting mechanics not to round spine or twist to avoid aggravating nerve.    Person(s) Educated Patient   Methods Explanation;Demonstration   Comprehension Verbalized understanding;Returned demonstration             PT Long Term Goals - 01/09/16 1112      PT LONG TERM GOAL #1   Title Patient will be able to stand for her 8 hour shift at work with no increased symptoms.    Baseline Difficulty completing shifts due to pain. -- Baseline. Patient reports  she feels much better at work over the weekend. -- 12/05/2015. Reports up until yesterday she was feeling much better at work - 7/24.    Time 4   Period Weeks   Status Partially Met     PT LONG TERM GOAL #2   Title Patient will demonstrate full AROM into trunk flexion/extension with no increase in pain to demonstrate ability to complete work related tasks.    Baseline Pain at end range flexion/extension. (Pain with L side bending). -- Was completing up until acute flare up yesterday (7/24).    Time 4   Period Weeks   Status Achieved     PT LONG TERM GOAL #3   Title Patient will report no pain for at least 3 consecutive days to demonstrate improved ability to complete ADLs.    Baseline Pain only after prolonged standing/heavy lifting . -- Patient reports she was  feeling pretty good up until acute flare up yesterday.    Time 4   Period Weeks   Status Partially Met     PT LONG TERM GOAL #4   Title Patient will report a modified ODI score of less than 25% disability to demonstrate improved tolerance for work related activities.    Baseline 36% at baseline. 16% on 12/05/2015   Time 4   Period Weeks   Status Achieved               Plan - 01/09/16 1107    Clinical Impression Statement Patient had been making excellent progress up until lifting maneuver yesterday which appears to have acutely exacerbated her sciatic type symptoms on RLE. She has pain reduction over the past day, and reports significant pain reduction in this session with prone on elbows, sciatic n flossing. Provided as part of HEP and informed patient this type of pain generally resolves itself over the course of 1-2 weeks so long as she does not re-aggravate it.    Rehab Potential Good   Clinical Impairments Affecting Rehab Potential Multiple successful resolutions of prior episodes.    PT Frequency 2x / week   PT Duration 4 weeks   PT Treatment/Interventions Cryotherapy;Electrical Stimulation;Therapeutic exercise;Therapeutic activities;Manual techniques;Dry needling   PT Next Visit Plan Core strengthening and posterior chain strengthening. Soft tissue mobilization, hip flexor stretching, QL stretching.    PT Home Exercise Plan Tennis ball massage/mobilization on wall, hip flexor stretching in half kneeling.    Consulted and Agree with Plan of Care Patient      Patient will benefit from skilled therapeutic intervention in order to improve the following deficits and impairments:  Pain, Impaired perceived functional ability, Decreased strength, Decreased balance  Visit Diagnosis: Right-sided low back pain with right-sided sciatica - Plan: PT plan of care cert/re-cert     Problem List Patient Active Problem List   Diagnosis Date Noted  . Allergic rhinitis 01/27/2015  .  Anxiety 01/27/2015  . Back pain, chronic 01/27/2015  . Clinical depression 01/27/2015  . Acid reflux 01/27/2015  . HLD (hyperlipidemia) 01/27/2015  . BP (high blood pressure) 01/27/2015  . Cannot sleep 01/27/2015  . 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, [place], [address] Phone: [phone]   Fax:  [phone]  Name: LEEBA BARBE MRN: [id number] Date of Birth: [birth year]

## 2016-01-09 NOTE — Patient Instructions (Addendum)
Educated patient regarding twisting/lifting mechanics, that the spine does not tolerate rotation and flexion combined with load, thus likely flaring up her symptoms. Educated patient on keeping her navel and nose in alignment to promote hip rotation.   R sciatic n flossing 3 sets x 10 repetitions with PT providing overpressure  -- felt pain initially upon standing which reduced with additional stepping (less pain during gait than when she walked in).   Prone soft tissue mobilization over gluteal insertion -- twitch response x 3, appeared to reduce with prolonged exposure.   Prone on elbows x 8 repetitions   Tennis ball soft tissue mobilization x 45" for 2 bouts for patient to complete at home. Patient reported significant reduction in symptoms after use of tennis ball and other exercises provided in this session.

## 2016-01-17 ENCOUNTER — Ambulatory Visit: Payer: BLUE CROSS/BLUE SHIELD | Admitting: Physical Therapy

## 2016-01-17 ENCOUNTER — Telehealth: Payer: Self-pay | Admitting: Family Medicine

## 2016-01-17 MED ORDER — CYCLOBENZAPRINE HCL 10 MG PO TABS: 10.0000 mg | ORAL_TABLET | Freq: Three times a day (TID) | ORAL | 5 refills | Status: DC | PRN

## 2016-01-17 NOTE — Telephone Encounter (Signed)
Ok--5 rf. 

## 2016-01-17 NOTE — Telephone Encounter (Signed)
Pt needs refill on cyclobenzapar 10 mg.  She uses [facility].    Her call back is [phone]  Thanks Con Memos

## 2016-01-17 NOTE — Telephone Encounter (Signed)
RF [id number]

## 2016-01-31 ENCOUNTER — Ambulatory Visit: Payer: BLUE CROSS/BLUE SHIELD | Admitting: Physical Therapy

## 2016-02-08 ENCOUNTER — Ambulatory Visit: Payer: BLUE CROSS/BLUE SHIELD | Attending: Family Medicine | Admitting: Physical Therapy

## 2016-02-08 DIAGNOSIS — M5441 Lumbago with sciatica, right side: Secondary | ICD-10-CM | POA: Diagnosis not present

## 2016-02-08 NOTE — Therapy (Signed)
[facility] [address], [place], [address] Phone: [phone]   Fax:  [phone]  Physical Therapy Treatment  Patient Details  Name: Jocelyn Harris MRN: [id number] Date of Birth: 11/04/1950 No Data Recorded  Encounter Date: 02/08/2016      PT End of Session - 02/08/16 0931    Visit Number 12   Number of Visits 13   Date for PT Re-Evaluation 03/07/16   PT Start Time 0850   PT Stop Time 0928   PT Time Calculation (min) 38 min   Activity Tolerance Patient tolerated treatment well   Behavior During Therapy [facility] for tasks assessed/performed      No past medical history on file.  Past Surgical History:  Procedure Laterality Date  . KNEE SURGERY Right   . TOE SURGERY    . TUBAL LIGATION      There were no vitals filed for this visit.      Subjective Assessment - 02/08/16 0855    Subjective Patient reports she had been doing quite well up until Saturday, she reports she still has issues with twisting/turning with a load in her hands as well as sitting.    Pertinent History Denies blurry vision, no change in weight. She would like to return to the gym, she thinks she may have hurt her back while at the gym a few years ago.    Limitations Lifting;Walking;Standing   Patient Stated Goals To return to full work duties. (Cold seems to help).    Currently in Pain? No/denies     Lifting mechanics again observed, noted to have objects away from her body slightly which would increase the demands on her lumbar extensors. Educated to keep as close to her body as possible, reinforced by having her transfer a  Box with 10# of weights from table to chair, including a rotation x 1 minute. She was noted to prefer lumbar spine for rotation initially, cued to keep nose, belly button, and belt buckle in a line which she was able to do with continuous vc's.   Reinforced with single leg palloff press with rotations with  red band (vc's for technique, initiating through hip rotators) once she performed appropriately, noted fatigue in appropriate musculature.   Single leg rotations to throws on trampoline with 2# ball, vc's for consistently initiating through her hip musculature.                             PT Education - 02/08/16 1559    Education provided Yes   Education Details HEP, instructions to call therapist next week for update on status.    Person(s) Educated Patient   Methods Explanation;Demonstration;Handout   Comprehension Verbalized understanding;Returned demonstration             PT Long Term Goals - 01/09/16 1112      PT LONG TERM GOAL #1   Title Patient will be able to stand for her 8 hour shift at work with no increased symptoms.    Baseline Difficulty completing shifts due to pain. -- Baseline. Patient reports she feels much better at work over the weekend. -- 12/05/2015. Reports up until yesterday she was feeling much better at work - 7/24.    Time 4   Period Weeks   Status Partially Met     PT LONG TERM GOAL #2   Title Patient will demonstrate full AROM into trunk  flexion/extension with no increase in pain to demonstrate ability to complete work related tasks.    Baseline Pain at end range flexion/extension. (Pain with L side bending). -- Was completing up until acute flare up yesterday (7/24).    Time 4   Period Weeks   Status Achieved     PT LONG TERM GOAL #3   Title Patient will report no pain for at least 3 consecutive days to demonstrate improved ability to complete ADLs.    Baseline Pain only after prolonged standing/heavy lifting . -- Patient reports she was feeling pretty good up until acute flare up yesterday.    Time 4   Period Weeks   Status Partially Met     PT LONG TERM GOAL #4   Title Patient will report a modified ODI score of less than 25% disability to demonstrate improved tolerance for work related activities.    Baseline 36% at  baseline. 16% on 12/05/2015   Time 4   Period Weeks   Status Achieved               Plan - 02/08/16 1600    Clinical Impression Statement Patient demonstrated lifting maneuvers as well as how she turns with heavy objects, both indicative of more than desired lumbar motion in both sagittal and frontal planes. She reports no pain with cuing for keeping weight closer to her COM and maintaining neutral lumbar spine with rotations, instead opting for hip rotations (difficult for her to dissociate the two).    Rehab Potential Good   Clinical Impairments Affecting Rehab Potential Multiple successful resolutions of prior episodes.    PT Frequency 2x / week   PT Duration 4 weeks   PT Treatment/Interventions Cryotherapy;Electrical Stimulation;Therapeutic exercise;Therapeutic activities;Manual techniques;Dry needling   PT Next Visit Plan Core strengthening and posterior chain strengthening. Soft tissue mobilization, hip flexor stretching, QL stretching.    PT Home Exercise Plan Tennis ball massage/mobilization on wall, hip flexor stretching in half kneeling.    Consulted and Agree with Plan of Care Patient      Patient will benefit from skilled therapeutic intervention in order to improve the following deficits and impairments:  Pain, Impaired perceived functional ability, Decreased strength, Decreased balance  Visit Diagnosis: Right-sided low back pain with right-sided sciatica     Problem List Patient Active Problem List   Diagnosis Date Noted  . Allergic rhinitis 01/27/2015  . Anxiety 01/27/2015  . Back pain, chronic 01/27/2015  . Clinical depression 01/27/2015  . Acid reflux 01/27/2015  . HLD (hyperlipidemia) 01/27/2015  . BP (high blood pressure) 01/27/2015  . Cannot sleep 01/27/2015  . 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 [place], [address] Phone: [phone]   Fax:  [phone]  Name: Jocelyn Harris MRN: [id number] Date of Birth: December 04, 1950

## 2016-02-29 ENCOUNTER — Encounter: Payer: BLUE CROSS/BLUE SHIELD | Admitting: Family Medicine

## 2016-03-12 ENCOUNTER — Ambulatory Visit (INDEPENDENT_AMBULATORY_CARE_PROVIDER_SITE_OTHER): Payer: BLUE CROSS/BLUE SHIELD | Admitting: Family Medicine

## 2016-03-12 VITALS — BP 138/60 | HR 76 | Temp 98.2°F | Resp 16 | Wt 120.0 lb

## 2016-03-12 DIAGNOSIS — J329 Chronic sinusitis, unspecified: Secondary | ICD-10-CM

## 2016-03-12 MED ORDER — AMOXICILLIN-POT CLAVULANATE 875-125 MG PO TABS: 1.0000 | ORAL_TABLET | Freq: Two times a day (BID) | ORAL | 0 refills | Status: DC

## 2016-03-12 NOTE — Progress Notes (Signed)
Jocelyn Harris  MRN: [id number] DOB: [birth year]  Subjective:  HPI   The patient is a 65 year old female who presents for evaluation of cold and sinus symptoms.  She states she went to the beach last week and while down there she began having cold symptoms.  She states that about 5 days ago she started having sore throat with hoarseness and now she has facial pain with sinus pressure and congestion.  She had lost her voice a little over the weekend.  She feels that it is now going down into her chest and she felt she needed to get something more for it than what she has been taking.  She has been using Mucinex and Robitussin.  Patient Active Problem List   Diagnosis Date Noted  . Allergic rhinitis 01/27/2015  . Anxiety 01/27/2015  . Back pain, chronic 01/27/2015  . Clinical depression 01/27/2015  . Acid reflux 01/27/2015  . HLD (hyperlipidemia) 01/27/2015  . BP (high blood pressure) 01/27/2015  . Cannot sleep 01/27/2015  . 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  Social History   Social History  . Marital status: Divorced    Spouse name: N/A  . Number of children: N/A  . Years of education: N/A   Occupational History  . Not on file.   Social History Main Topics  . Smoking status: Never Smoker  . Smokeless tobacco: Never Used  . Alcohol use 0.0 oz/week  . Drug use: No  . Sexual activity: Not on file   Other Topics Concern  . Not on file   Social History Narrative  . No narrative on file    Outpatient Encounter Prescriptions as of 03/12/2016  Medication Sig Note  . busPIRone (BUSPAR) 10 MG tablet Take 1 tablet (10 mg total) by mouth 3 (three) times daily.   . calcium gluconate 500 MG tablet Take by mouth. 01/27/2015: Received from: [facility]  . Cholecalciferol 1000 UNITS capsule Take by mouth. 01/27/2015: Received from: [facility]  . conjugated estrogens (PREMARIN) vaginal cream Place 1  Applicatorful vaginally daily.   . cyclobenzaprine (FLEXERIL) 10 MG tablet Take 1 tablet (10 mg total) by mouth 3 (three) times daily as needed for muscle spasms.   Noelle Penner ALLERGY RELIEF 180 MG tablet TAKE ONE TABLET BY MOUTH ONCE DAILY   . fluticasone (FLONASE) 50 MCG/ACT nasal spray Place 2 sprays into both nostrils daily.   Marland Kitchen ibuprofen (ADVIL,MOTRIN) 800 MG tablet Take 1 tablet (800 mg total) by mouth every 8 (eight) hours as needed.   Marland Kitchen lisinopril (PRINIVIL,ZESTRIL) 10 MG tablet Take 1 tablet (10 mg total) by mouth daily.   . montelukast (SINGULAIR) 10 MG tablet Take 1 tablet (10 mg total) by mouth at bedtime.   . MULTIPLE VITAMINS PO Take by mouth. 01/27/2015: Received from: [facility]  . ranitidine (ZANTAC) 150 MG tablet Take 1 tablet (150 mg total) by mouth 2 (two) times daily.   . traZODone (DESYREL) 150 MG tablet Take 1 tablet (150 mg total) by mouth at bedtime.   . vitamin E 100 UNIT capsule Take by mouth. 01/27/2015: Received from: [facility]  . [DISCONTINUED] predniSONE (STERAPRED UNI-PAK 21 TAB) 5 MG (21) TBPK tablet Take 1 tablet (5 mg total) by mouth daily. As directed 6 day taper (Patient not taking: Reported on 10/18/2015)    No facility-administered encounter medications on file as of 03/12/2016.     Allergies  Allergen  Reactions  . Doxycycline Nausea Only and Nausea And Vomiting    Review of Systems  Constitutional: Positive for chills, fever and malaise/fatigue.  HENT: Positive for congestion, ear pain and sore throat. Negative for ear discharge, hearing loss, nosebleeds and tinnitus.   Eyes: Negative for blurred vision, double vision, photophobia, pain, discharge and redness.  Respiratory: Positive for cough, sputum production, shortness of breath and wheezing.   Cardiovascular: Negative for chest pain, palpitations, orthopnea, claudication, leg swelling and PND.  Neurological: Positive for weakness and headaches. Negative for dizziness.    Endo/Heme/Allergies: Negative.   Psychiatric/Behavioral: Negative.    Objective:  BP 138/60   Pulse 76   Temp 98.2 F (36.8 C) (Oral)   Resp 16   Wt 120 lb (54.4 kg)   BMI 22.67 kg/m   Physical Exam  Constitutional: She is oriented to person, place, and time and well-developed, well-nourished, and in no distress.  HENT:  Head: Normocephalic and atraumatic.  Right Ear: External ear normal.  Left Ear: External ear normal.  Nose: Nose normal.  Eyes: Conjunctivae are normal.  Neck: Neck supple. No thyromegaly present.  Cardiovascular: Normal rate, regular rhythm and normal heart sounds.   Pulmonary/Chest: Effort normal and breath sounds normal.  Abdominal: Soft.  Lymphadenopathy:    She has no cervical adenopathy.  Neurological: She is alert and oriented to person, place, and time.  Skin: Skin is warm and dry.  Psychiatric: Mood, memory, affect and judgment normal.    Assessment and Plan :  1. Sinusitis, unspecified chronicity, unspecified location  - amoxicillin-clavulanate (AUGMENTIN) 875-125 MG tablet; Take 1 tablet by mouth 2 (two) times daily.  Dispense: 20 tablet; Refill: 0 2.GAD  I have done the exam and reviewed the chart and it is accurate to the best of my knowledge. Miguel Aschoff M.D. [facility]

## 2016-03-19 ENCOUNTER — Telehealth: Payer: Self-pay | Admitting: Family Medicine

## 2016-03-19 NOTE — Telephone Encounter (Signed)
Pt informed. She will continue the Augmentin and see how she feels. She reports that she is not feeling much better but she has a CPE schedule for this week and will talk about it then if she is not any better.

## 2016-03-19 NOTE — Telephone Encounter (Signed)
Side effects  from Augmentin. It is not an allergy.

## 2016-03-19 NOTE — Telephone Encounter (Signed)
Pt states she started taking the Rx amoxicillin-clavulanate (AUGMENTIN) 875-125 MG tablet on Thursday night and on Fridat morning pt threw up.  Pt has also had diarrhea since Friday.  Pt is asking if this could be from the Rx or somethning different.  CB#[phone]/MW

## 2016-03-22 ENCOUNTER — Ambulatory Visit (INDEPENDENT_AMBULATORY_CARE_PROVIDER_SITE_OTHER): Payer: BLUE CROSS/BLUE SHIELD | Admitting: Family Medicine

## 2016-03-22 ENCOUNTER — Encounter: Payer: Self-pay | Admitting: Family Medicine

## 2016-03-22 VITALS — BP 142/74 | HR 64 | Temp 97.9°F | Resp 12 | Ht 60.75 in | Wt 120.0 lb

## 2016-03-22 DIAGNOSIS — Z1211 Encounter for screening for malignant neoplasm of colon: Secondary | ICD-10-CM | POA: Diagnosis not present

## 2016-03-22 DIAGNOSIS — Z1231 Encounter for screening mammogram for malignant neoplasm of breast: Secondary | ICD-10-CM

## 2016-03-22 DIAGNOSIS — Z1239 Encounter for other screening for malignant neoplasm of breast: Secondary | ICD-10-CM

## 2016-03-22 DIAGNOSIS — Z0000 Encounter for general adult medical examination without abnormal findings: Secondary | ICD-10-CM | POA: Diagnosis not present

## 2016-03-22 DIAGNOSIS — Z124 Encounter for screening for malignant neoplasm of cervix: Secondary | ICD-10-CM

## 2016-03-22 LAB — POCT URINALYSIS DIPSTICK
Bilirubin, UA: NEGATIVE
Blood, UA: NEGATIVE
GLUCOSE UA: NEGATIVE
KETONES UA: NEGATIVE
LEUKOCYTES UA: NEGATIVE
Nitrite, UA: NEGATIVE
Protein, UA: NEGATIVE
Urobilinogen, UA: 0.2
pH, UA: 5

## 2016-03-22 LAB — IFOBT (OCCULT BLOOD): IMMUNOLOGICAL FECAL OCCULT BLOOD TEST: NEGATIVE

## 2016-03-22 NOTE — Progress Notes (Signed)
Patient: Jocelyn Harris, Female    DOB: 10/09/1950, 65 y.o.   MRN: [id number] Visit Date: 03/22/2016  Today's Provider: Wilhemena Durie, MD   Chief Complaint  Patient presents with  . Annual Exam   Subjective:  Jocelyn Harris is a 65 y.o. female who presents today for health maintenance and complete physical. She feels well. She reports exercising not right now. She reports she is sleeping well.  Immunization History  Administered Date(s) Administered  . Influenza-Unspecified 06/16/2015  . Tdap 12/23/2013  . Zoster 01/20/2014   Last Pap smear with HPV 12/23/13 normal, never had  hysterectomy  Colonoscopy 02/04/14 normal repeat in 10  years  BMD 04/19/12  Mammogram 2003.  Review of Systems  Constitutional: Negative.   HENT: Positive for congestion.   Eyes: Negative.   Respiratory: Positive for cough and shortness of breath.   Cardiovascular: Negative.   Gastrointestinal: Positive for diarrhea.       Patient had GI distress on Augmentin with nausea and diarrhea  Endocrine: Negative.   Genitourinary: Negative.        Vaginal dryness with dyspareunia/pain with intercourse.  Musculoskeletal: Positive for back pain.  Skin: Negative.   Allergic/Immunologic: Negative.   Neurological: Negative.   Hematological: Negative.   Psychiatric/Behavioral: Negative.     Social History   Social History  . Marital status: Divorced    Spouse name: N/A  . Number of children: N/A  . Years of education: N/A   Occupational History  . Not on file.   Social History Main Topics  . Smoking status: Never Smoker  . Smokeless tobacco: Never Used  . Alcohol use 0.0 oz/week  . Drug use: No  . Sexual activity: Not on file   Other Topics Concern  . Not on file   Social History Narrative  . No narrative on file    Patient Active Problem List   Diagnosis Date Noted  . Allergic rhinitis 01/27/2015  . Anxiety 01/27/2015  . Back pain, chronic 01/27/2015  . Clinical depression 01/27/2015   . Acid reflux 01/27/2015  . HLD (hyperlipidemia) 01/27/2015  . BP (high blood pressure) 01/27/2015  . Cannot sleep 01/27/2015  . 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 KNEE SURGERY Right   . TOE SURGERY    . TUBAL LIGATION      Her family history includes Cancer in her sister; Congestive Heart Failure in her mother; Diabetes in her mother; Fibromyalgia in her sister; Hypertension in her father and mother; Lung cancer in her father; Lupus in her sister.    Outpatient Encounter Prescriptions as of 03/22/2016  Medication Sig Note  . amoxicillin-clavulanate (AUGMENTIN) 875-125 MG tablet Take 1 tablet by mouth 2 (two) times daily.   . busPIRone (BUSPAR) 10 MG tablet Take 1 tablet (10 mg total) by mouth 3 (three) times daily.   . calcium gluconate 500 MG tablet Take by mouth. 01/27/2015: Received from: [facility]  . Cholecalciferol 1000 UNITS capsule Take by mouth. 01/27/2015: Received from: [facility]  . conjugated estrogens (PREMARIN) vaginal cream Place 1 Applicatorful vaginally daily.   . cyclobenzaprine (FLEXERIL) 10 MG tablet Take 1 tablet (10 mg total) by mouth 3 (three) times daily as needed for muscle spasms.   Noelle Penner ALLERGY RELIEF 180 MG tablet TAKE ONE TABLET BY MOUTH ONCE DAILY   . fluticasone (FLONASE) 50 MCG/ACT nasal spray Place 2 sprays into both nostrils daily.   Marland Kitchen  ibuprofen (ADVIL,MOTRIN) 800 MG tablet Take 1 tablet (800 mg total) by mouth every 8 (eight) hours as needed.   Marland Kitchen lisinopril (PRINIVIL,ZESTRIL) 10 MG tablet Take 1 tablet (10 mg total) by mouth daily.   . montelukast (SINGULAIR) 10 MG tablet Take 1 tablet (10 mg total) by mouth at bedtime.   . MULTIPLE VITAMINS PO Take by mouth. 01/27/2015: Received from: [facility]  . ranitidine (ZANTAC) 150 MG tablet Take 1 tablet (150 mg total) by mouth 2 (two) times daily.   . traZODone (DESYREL) 150 MG tablet Take 1 tablet  (150 mg total) by mouth at bedtime.   . vitamin E 100 UNIT capsule Take by mouth. 01/27/2015: Received from: [facility]   No facility-administered encounter medications on file as of 03/22/2016.     Patient Care Team: Jerrol Banana., MD as PCP - General (Family Medicine)     Objective:   Vitals:  Vitals:   03/22/16 1043  BP: (!) 142/74  Pulse: 64  Resp: 12  Temp: 97.9 F (36.6 C)  Weight: 120 lb (54.4 kg)  Height: 5' 0.75" (1.543 m)    Physical Exam  Constitutional: She is oriented to person, place, and time. She appears well-developed and well-nourished.  HENT:  Head: Normocephalic and atraumatic.  Right Ear: External ear normal.  Left Ear: External ear normal.  Nose: Nose normal.  Mouth/Throat: Oropharynx is clear and moist.  Eyes: Conjunctivae and EOM are normal. Pupils are equal, round, and reactive to light. No scleral icterus.  Neck: Neck supple. No thyromegaly present.  Cardiovascular: Normal rate, regular rhythm, normal heart sounds and intact distal pulses.   Pulmonary/Chest: Effort normal and breath sounds normal.  Abdominal: Soft.  Genitourinary: Vagina normal and uterus normal. Rectal exam shows guaiac negative stool.  Musculoskeletal: Normal range of motion. She exhibits no edema.  Lymphadenopathy:    She has no cervical adenopathy.  Neurological: She is alert and oriented to person, place, and time. No cranial nerve deficit. She exhibits normal muscle tone. Coordination normal.  Skin: Skin is warm and dry.  Psychiatric: She has a normal mood and affect. Her behavior is normal. Judgment and thought content normal.     Depression Screen PHQ 2/9 Scores 03/22/2016  PHQ - 2 Score 0      Assessment & Plan:   1. Annual physical exam - POCT urinalysis dipstick - CBC w/Diff/Platelet - Comprehensive metabolic panel - Lipid Panel With LDL/HDL Ratio - TSH  2. Colon cancer screening - IFOBT POC (occult bld, rslt in office)  3.  Pap smear for cervical cancer screening - Pap IG and HPV (high risk) DNA detection  4. Breast cancer screening - MM Digital Screening; Future  5. Vaginal dryness Try Premarin cream twice a week for 2 months and re check then. Also advised patient to try OTC lubricant. Offered referral to gynecologist at any point patient wants to. Post menopausal symptoms in a patient who has a uterus. Be very careful using the Premarin cream. She is advised of the risk of uterine cancer with unopposed estrogen. Prempro is  option going forward. We'll use that 1 g intravaginal dose twice a week for a few weeks and then cut back to once a week. The topical Premarin nighttime just on urethral opening would be a safe option. Spurious as this is such a small amount of estrogen it is frequently not helpful. 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 The workup in the past has  been benign.  HPI, Exam and A&P transcribed under direction and in the presence of Miguel Aschoff, MD.  I have done the exam and reviewed the chart and it is accurate to the best of my knowledge. Miguel Aschoff M.D. [facility]

## 2016-03-23 LAB — COMPREHENSIVE METABOLIC PANEL
A/G RATIO: 2.1 (ref 1.2–2.2)
ALBUMIN: 4.7 g/dL (ref 3.6–4.8)
ALK PHOS: 72 IU/L (ref 39–117)
ALT: 19 IU/L (ref 0–32)
AST: 21 IU/L (ref 0–40)
BILIRUBIN TOTAL: 0.4 mg/dL (ref 0.0–1.2)
BUN / CREAT RATIO: 26 (ref 12–28)
BUN: 20 mg/dL (ref 8–27)
CHLORIDE: 102 mmol/L (ref 96–106)
CO2: 26 mmol/L (ref 18–29)
CREATININE: 0.76 mg/dL (ref 0.57–1.00)
Calcium: 9.8 mg/dL (ref 8.7–10.3)
GFR calc Af Amer: 97 mL/min/{1.73_m2} (ref >59)
GFR calc non Af Amer: 84 mL/min/{1.73_m2} (ref >59)
GLOBULIN, TOTAL: 2.2 g/dL (ref 1.5–4.5)
Glucose: 87 mg/dL (ref 65–99)
POTASSIUM: 4.1 mmol/L (ref 3.5–5.2)
SODIUM: 143 mmol/L (ref 134–144)
Total Protein: 6.9 g/dL (ref 6.0–8.5)

## 2016-03-23 LAB — CBC WITH DIFFERENTIAL/PLATELET
Basophils Absolute: 0.1 10*3/uL (ref 0.0–0.2)
Basos: 1 %
EOS (ABSOLUTE): 0.2 10*3/uL (ref 0.0–0.4)
EOS: 2 %
HEMATOCRIT: 36.9 % (ref 34.0–46.6)
HEMOGLOBIN: 12 g/dL (ref 11.1–15.9)
Immature Grans (Abs): 0 10*3/uL (ref 0.0–0.1)
Immature Granulocytes: 0 %
LYMPHS ABS: 2.7 10*3/uL (ref 0.7–3.1)
Lymphs: 29 %
MCH: 28.5 pg (ref 26.6–33.0)
MCHC: 32.5 g/dL (ref 31.5–35.7)
MCV: 88 fL (ref 79–97)
MONOCYTES: 6 %
MONOS ABS: 0.6 10*3/uL (ref 0.1–0.9)
NEUTROS ABS: 5.9 10*3/uL (ref 1.4–7.0)
Neutrophils: 62 %
Platelets: 311 10*3/uL (ref 150–379)
RBC: 4.21 x10E6/uL (ref 3.77–5.28)
RDW: 13.7 % (ref 12.3–15.4)
WBC: 9.4 10*3/uL (ref 3.4–10.8)

## 2016-03-23 LAB — LIPID PANEL WITH LDL/HDL RATIO
CHOLESTEROL TOTAL: 189 mg/dL (ref 100–199)
HDL: 59 mg/dL (ref >39)
LDL CALC: 99 mg/dL (ref 0–99)
LDl/HDL Ratio: 1.7 ratio units (ref 0.0–3.2)
Triglycerides: 155 mg/dL — ABNORMAL HIGH (ref 0–149)
VLDL Cholesterol Cal: 31 mg/dL (ref 5–40)

## 2016-03-23 LAB — TSH: TSH: 2.21 u[IU]/mL (ref 0.450–4.500)

## 2016-03-27 LAB — PAP IG AND HPV HIGH-RISK
HPV, high-risk: POSITIVE — AB
PAP Smear Comment: 0

## 2016-03-28 ENCOUNTER — Encounter: Payer: Self-pay | Admitting: Family Medicine

## 2016-03-28 ENCOUNTER — Ambulatory Visit (INDEPENDENT_AMBULATORY_CARE_PROVIDER_SITE_OTHER): Payer: BLUE CROSS/BLUE SHIELD | Admitting: Family Medicine

## 2016-03-28 ENCOUNTER — Telehealth: Payer: Self-pay

## 2016-03-28 VITALS — BP 124/58 | HR 70 | Temp 98.3°F | Resp 14 | Wt 118.0 lb

## 2016-03-28 DIAGNOSIS — R110 Nausea: Secondary | ICD-10-CM

## 2016-03-28 DIAGNOSIS — R87618 Other abnormal cytological findings on specimens from cervix uteri: Secondary | ICD-10-CM

## 2016-03-28 DIAGNOSIS — R8789 Other abnormal findings in specimens from female genital organs: Secondary | ICD-10-CM | POA: Diagnosis not present

## 2016-03-28 DIAGNOSIS — K219 Gastro-esophageal reflux disease without esophagitis: Secondary | ICD-10-CM

## 2016-03-28 MED ORDER — OMEPRAZOLE 20 MG PO CPDR: 20.0000 mg | DELAYED_RELEASE_CAPSULE | ORAL | 3 refills | Status: DC

## 2016-03-28 NOTE — Patient Instructions (Addendum)
Over the counter probiotic for nausea and taking Omeprazole and Ranitidine in the evening.  Call if nausea does not get better or gets worse and will call in something for nausea.

## 2016-03-28 NOTE — Telephone Encounter (Signed)
LMTCB

## 2016-03-28 NOTE — Telephone Encounter (Signed)
-----   Message from Jerrol Banana., MD sent at 03/28/2016  8:25 AM EDT ----- Pap/HPV positive--refer to Gyn.

## 2016-03-28 NOTE — Progress Notes (Signed)
Subjective:  HPI Pt is here today for reflux. She reports that she has had this before and it is back again. She has a burning sensation in her throat and stomach and she has felt like stomach acid was coming up. She has also been nauseated. This was starting last week when she was here but has gotten worse over the past couple of days. She denies chest pain. But says she has been dizzy and felt like she can not take a deep breath due to the burning in her throat. She was taken Augmentin for sinusitis and then she started having diarrhea. She did not have any of the stomach symptoms until she started the antibiotic. She reports that she just has not felt good and has been tired. She is taking ranitidine once daily.   Prior to Admission medications   Medication Sig Start Date End Date Taking? Authorizing Provider  amoxicillin-clavulanate (AUGMENTIN) 875-125 MG tablet Take 1 tablet by mouth 2 (two) times daily. 03/12/16   Richard Maceo Pro., MD  busPIRone (BUSPAR) 10 MG tablet Take 1 tablet (10 mg total) by mouth 3 (three) times daily. 08/29/15   Richard Maceo Pro., MD  calcium gluconate 500 MG tablet Take by mouth.    Historical Provider, MD  Cholecalciferol 1000 UNITS capsule Take by mouth.    Historical Provider, MD  conjugated estrogens (PREMARIN) vaginal cream Place 1 Applicatorful vaginally daily. 08/03/15   Richard Maceo Pro., MD  cyclobenzaprine (FLEXERIL) 10 MG tablet Take 1 tablet (10 mg total) by mouth 3 (three) times daily as needed for muscle spasms. 01/17/16   Richard Maceo Pro., MD  EQ ALLERGY RELIEF 180 MG tablet TAKE ONE TABLET BY MOUTH ONCE DAILY 05/27/15   Jerrol Banana., MD  fluticasone [facility]) 50 MCG/ACT nasal spray Place 2 sprays into both nostrils daily. 08/29/15   Richard Maceo Pro., MD  ibuprofen (ADVIL,MOTRIN) 800 MG tablet Take 1 tablet (800 mg total) by mouth every 8 (eight) hours as needed. 08/29/15   Richard Maceo Pro., MD  lisinopril  (PRINIVIL,ZESTRIL) 10 MG tablet Take 1 tablet (10 mg total) by mouth daily. 07/08/15   Richard Maceo Pro., MD  montelukast (SINGULAIR) 10 MG tablet Take 1 tablet (10 mg total) by mouth at bedtime. 10/03/15   Richard Maceo Pro., MD  MULTIPLE VITAMINS PO Take by mouth.    Historical Provider, MD  ranitidine (ZANTAC) 150 MG tablet Take 1 tablet (150 mg total) by mouth 2 (two) times daily. 08/29/15   Richard Maceo Pro., MD  traZODone (DESYREL) 150 MG tablet Take 1 tablet (150 mg total) by mouth at bedtime. 07/08/15   Jerrol Banana., MD  vitamin E 100 UNIT capsule Take by mouth.    Historical Provider, MD    Patient Active Problem List   Diagnosis Date Noted  . Allergic rhinitis 01/27/2015  . Anxiety 01/27/2015  . Back pain, chronic 01/27/2015  . Clinical depression 01/27/2015  . Acid reflux 01/27/2015  . HLD (hyperlipidemia) 01/27/2015  . BP (high blood pressure) 01/27/2015  . Cannot sleep 01/27/2015  . 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 No pertinent past medical history.  Social History   Social History  . Marital status: Divorced    Spouse name: N/A  . Number of children: N/A  . Years of education: N/A   Occupational History  . Not on file.   Social History Main Topics  .  Smoking status: Never Smoker  . Smokeless tobacco: Never Used  . Alcohol use 0.0 oz/week  . Drug use: No  . Sexual activity: Not on file   Other Topics Concern  . Not on file   Social History Narrative  . No narrative on file    Allergies  Allergen Reactions  . Doxycycline Nausea Only and Nausea And Vomiting    Review of Systems  Constitutional: Positive for malaise/fatigue.  HENT: Positive for congestion.   Eyes: Negative.   Respiratory: Positive for cough and shortness of breath.   Cardiovascular: Negative.   Gastrointestinal: Positive for diarrhea, heartburn and nausea.  Genitourinary: Negative.   Musculoskeletal: Negative.   Skin: Negative.     Neurological: Positive for dizziness.  Endo/Heme/Allergies: Negative.   Psychiatric/Behavioral: Negative.     Immunization History  Administered Date(s) Administered  . Influenza-Unspecified 06/16/2015  . Tdap 12/23/2013  . Zoster 01/20/2014   Objective:  BP (!) 124/58 (BP Location: Left Arm, Patient Position: Sitting, Cuff Size: Normal)   Pulse 70   Temp 98.3 F (36.8 C) (Oral)   Resp 14   Wt 118 lb (53.5 kg)   SpO2 98%   BMI 22.48 kg/m   Physical Exam  Constitutional: She is oriented to person, place, and time and well-developed, well-nourished, and in no distress.  HENT:  Head: Normocephalic and atraumatic.  Right Ear: External ear normal.  Left Ear: External ear normal.  Nose: Nose normal.  Mouth/Throat: Oropharynx is clear and moist.  Eyes: Conjunctivae and EOM are normal. Pupils are equal, round, and reactive to light.  Neck: Normal range of motion. Neck supple.  Cardiovascular: Normal rate, normal heart sounds and intact distal pulses.   Pulmonary/Chest: Effort normal and breath sounds normal.  Abdominal: Soft. Bowel sounds are normal.  Musculoskeletal: Normal range of motion.  Neurological: She is alert and oriented to person, place, and time. She has normal reflexes. Gait normal. GCS score is 15.  Skin: Skin is warm and dry.  Psychiatric: Mood, memory, affect and judgment normal.    Lab Results  Component Value Date   WBC 9.4 03/22/2016   HGB 12.6 12/23/2013   HCT 36.9 03/22/2016   PLT 311 03/22/2016   GLUCOSE 87 03/22/2016   CHOL 189 03/22/2016   TRIG 155 (H) 03/22/2016   HDL 59 03/22/2016   LDLCALC 99 03/22/2016   TSH 2.210 03/22/2016    CMP     Component Value Date/Time   NA 143 03/22/2016 1139   K 4.1 03/22/2016 1139   CL 102 03/22/2016 1139   CO2 26 03/22/2016 1139   GLUCOSE 87 03/22/2016 1139   BUN 20 03/22/2016 1139   CREATININE 0.76 03/22/2016 1139   CALCIUM 9.8 03/22/2016 1139   PROT 6.9 03/22/2016 1139   ALBUMIN 4.7 03/22/2016  1139   AST 21 03/22/2016 1139   ALT 19 03/22/2016 1139   ALKPHOS 72 03/22/2016 1139   BILITOT 0.4 03/22/2016 1139   GFRNONAA 84 03/22/2016 1139   GFRAA 97 03/22/2016 1139    Assessment and Plan :  1. Gastroesophageal reflux disease, esophagitis presence not specified  - omeprazole (PRILOSEC) 20 MG capsule; Take 1 capsule (20 mg total) by mouth every morning.  Dispense: 30 capsule; Refill: 3  2. Nausea Try probiotics if does not improve will call back and call in Promethazine.  I absolutely believe that some of this is just a normal side effect from Augmentin.  3. 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 Cranford Mon, MD  Electronically Signed: Webb Laws, CMA  I have done the exam and reviewed the above chart and it is accurate to the best of my knowledge.  Miguel Aschoff MD [facility] 03/28/2016 11:23 AM

## 2016-03-28 NOTE — Telephone Encounter (Signed)
Pt informed at acute OV today. Referral order made.

## 2016-03-30 ENCOUNTER — Telehealth: Payer: Self-pay | Admitting: Family Medicine

## 2016-03-30 NOTE — Telephone Encounter (Signed)
Pt is requesting a call back about to discuss labs.  CB#[phone]/MW

## 2016-04-02 NOTE — Telephone Encounter (Signed)
Left message to call back  

## 2016-04-02 NOTE — Telephone Encounter (Signed)
Spoke with patient, she had question about her pap smear HPV results.-aa

## 2016-05-28 ENCOUNTER — Ambulatory Visit: Payer: BLUE CROSS/BLUE SHIELD | Admitting: Family Medicine

## 2016-05-31 ENCOUNTER — Ambulatory Visit (INDEPENDENT_AMBULATORY_CARE_PROVIDER_SITE_OTHER): Payer: BLUE CROSS/BLUE SHIELD | Admitting: Family Medicine

## 2016-05-31 ENCOUNTER — Encounter: Payer: Self-pay | Admitting: Family Medicine

## 2016-05-31 VITALS — BP 124/58 | HR 60 | Temp 98.0°F | Resp 16 | Wt 123.0 lb

## 2016-05-31 DIAGNOSIS — I10 Essential (primary) hypertension: Secondary | ICD-10-CM | POA: Diagnosis not present

## 2016-05-31 DIAGNOSIS — K219 Gastro-esophageal reflux disease without esophagitis: Secondary | ICD-10-CM

## 2016-05-31 DIAGNOSIS — R05 Cough: Secondary | ICD-10-CM

## 2016-05-31 DIAGNOSIS — R059 Cough, unspecified: Secondary | ICD-10-CM

## 2016-05-31 MED ORDER — FEXOFENADINE HCL 180 MG PO TABS: 180.0000 mg | ORAL_TABLET | Freq: Every day | ORAL | 11 refills | Status: DC

## 2016-05-31 NOTE — Patient Instructions (Signed)
May discontinue Lisinopril to see if cough resolves. If not, start back Lisinopril by the beginning of the year.

## 2016-05-31 NOTE — Progress Notes (Signed)
Patient: Jocelyn Harris Female    DOB: December 17, 1950   65 y.o.   MRN: [id number] Visit Date: 05/31/2016  Today's Provider: Wilhemena Durie, MD   Chief Complaint  Patient presents with  . Gastroesophageal Reflux    follow up   Subjective:    HPI Patient comes in today for a follow up on GERD symptoms. She was last seen in the office about 2 months ago. It was recommended that she start Omeprazole 20mg  daily along with a probiotic. Patient reports that her symptoms have resolved.      Allergies  Allergen Reactions  . Doxycycline Nausea Only and Nausea And Vomiting     Current Outpatient Prescriptions:  .  busPIRone (BUSPAR) 10 MG tablet, Take 1 tablet (10 mg total) by mouth 3 (three) times daily., Disp: 90 tablet, Rfl: 12 .  calcium gluconate 500 MG tablet, Take by mouth., Disp: , Rfl:  .  Cholecalciferol 1000 UNITS capsule, Take by mouth., Disp: , Rfl:  .  conjugated estrogens (PREMARIN) vaginal cream, Place 1 Applicatorful vaginally daily., Disp: 42.5 g, Rfl: 12 .  cyclobenzaprine (FLEXERIL) 10 MG tablet, Take 1 tablet (10 mg total) by mouth 3 (three) times daily as needed for muscle spasms., Disp: 30 tablet, Rfl: 5 .  EQ ALLERGY RELIEF 180 MG tablet, TAKE ONE TABLET BY MOUTH ONCE DAILY, Disp: 30 tablet, Rfl: 0 .  fluticasone (FLONASE) 50 MCG/ACT nasal spray, Place 2 sprays into both nostrils daily., Disp: 16 g, Rfl: 12 .  ibuprofen (ADVIL,MOTRIN) 800 MG tablet, Take 1 tablet (800 mg total) by mouth every 8 (eight) hours as needed., Disp: 60 tablet, Rfl: 12 .  lisinopril (PRINIVIL,ZESTRIL) 10 MG tablet, Take 1 tablet (10 mg total) by mouth daily., Disp: 30 tablet, Rfl: 12 .  montelukast (SINGULAIR) 10 MG tablet, Take 1 tablet (10 mg total) by mouth at bedtime., Disp: 30 tablet, Rfl: 12 .  MULTIPLE VITAMINS PO, Take by mouth., Disp: , Rfl:  .  omeprazole (PRILOSEC) 20 MG capsule, Take 1 capsule (20 mg total) by mouth every morning., Disp: 30 capsule, Rfl: 3 .  ranitidine  (ZANTAC) 150 MG tablet, Take 1 tablet (150 mg total) by mouth 2 (two) times daily., Disp: 60 tablet, Rfl: 12 .  traZODone (DESYREL) 150 MG tablet, Take 1 tablet (150 mg total) by mouth at bedtime., Disp: 30 tablet, Rfl: 12 .  vitamin E 100 UNIT capsule, Take by mouth., Disp: , Rfl:  .  amoxicillin-clavulanate (AUGMENTIN) 875-125 MG tablet, Take 1 tablet by mouth 2 (two) times daily. (Patient not taking: Reported on 05/31/2016), Disp: 20 tablet, Rfl: 0  Review of Systems  Constitutional: Negative.   Respiratory: Negative.   Cardiovascular: Negative.   Gastrointestinal: Negative.   Allergic/Immunologic: Negative.   Neurological: Negative.   Hematological: Negative.   Psychiatric/Behavioral: Negative.     Social History  Substance Use Topics  . Smoking status: Never Smoker  . Smokeless tobacco: Never Used  . Alcohol use 0.0 oz/week   Objective:   BP (!) 124/58 (BP Location: Left Arm, Patient Position: Sitting, Cuff Size: Normal)   Pulse 60   Temp 98 F (36.7 C)   Resp 16   Wt 123 lb (55.8 kg)   BMI 23.43 kg/m   Physical Exam  Constitutional: She is oriented to person, place, and time. She appears well-developed and well-nourished.  HENT:  Head: Normocephalic and atraumatic.  Eyes: Conjunctivae are normal. No scleral icterus.  Neck: No thyromegaly present.  Cardiovascular: Normal rate, regular rhythm and normal heart sounds.   Pulmonary/Chest: Effort normal and breath sounds normal.  Abdominal: Soft.  Neurological: She is alert and oriented to person, place, and time.  Skin: Skin is warm and dry.  Psychiatric: She has a normal mood and affect. Her behavior is normal. Judgment and thought content normal.        Assessment & Plan:     1. Gastroesophageal reflux disease without esophagitis   2. Hypertension, unspecified type   3. Cough AR most likely vs GERD vs URI.  4. Abnormal Pap smear  See gynecology next week      I have done the exam and reviewed the  above chart and it is accurate to the best of my knowledge. Development worker, community has been used in this note in any air is in the dictation or transcription are unintentional.  Wilhemena Durie, MD  [place]

## 2016-06-05 ENCOUNTER — Encounter: Payer: Self-pay | Admitting: Obstetrics and Gynecology

## 2016-06-05 ENCOUNTER — Ambulatory Visit (INDEPENDENT_AMBULATORY_CARE_PROVIDER_SITE_OTHER): Payer: BLUE CROSS/BLUE SHIELD | Admitting: Obstetrics and Gynecology

## 2016-06-05 VITALS — BP 133/73 | HR 70 | Ht 60.75 in | Wt 122.0 lb

## 2016-06-05 DIAGNOSIS — R87618 Other abnormal cytological findings on specimens from cervix uteri: Secondary | ICD-10-CM

## 2016-06-05 DIAGNOSIS — N941 Unspecified dyspareunia: Secondary | ICD-10-CM | POA: Diagnosis not present

## 2016-06-05 DIAGNOSIS — R8789 Other abnormal findings in specimens from female genital organs: Secondary | ICD-10-CM | POA: Diagnosis not present

## 2016-06-05 DIAGNOSIS — N952 Postmenopausal atrophic vaginitis: Secondary | ICD-10-CM | POA: Diagnosis not present

## 2016-06-05 NOTE — Patient Instructions (Signed)
Preventing Cervical Cancer Cervical cancer is cancer that grows on the cervix. The cervix is at the bottom of the uterus. It connects the uterus to the vagina. The uterus is where a baby develops during pregnancy. Cancer occurs when cells become abnormal and start to grow out of control. Cervical cancer grows slowly and may not cause any symptoms at first. Over time, the cancer can grow deep into the cervix tissue and spread to other areas. If it is found early, cervical cancer can be treated effectively. You can also take steps to prevent this type of cancer. Most cases of cervical cancer are caused by an STI (sexually transmitted infection) called human papillomavirus (HPV). One way to reduce your risk of cervical cancer is to avoid infection with the HPV virus. You can do this by practicing safe sex and by getting the HPV vaccine. Getting regular Pap tests is also important because this can help identify changes in cells that could lead to cancer. Your chances of getting this disease can also be reduced by making certain lifestyle changes. How can I protect myself from cervical cancer? Preventing HPV infection  Ask your health care provider about getting the HPV vaccine. If you are 23 years old or younger, you may need to get this vaccine, which is given in three doses over 6 months. This vaccine protects against the types of HPV that could cause cancer.  Limit the number of people you have sex with. Also avoid having sex with people who have had many sex partners.  Use a latex condom during sex. Getting Pap tests  Get Pap tests regularly, starting at age 33. Talk with your health care provider about how often you need these tests.  Most women who are 73?63 years of age should have a Pap test every 3 years.  Most women who are 4?63 years of age should have a Pap test in combination with an HPV test every 5 years.  Women with a higher risk of cervical cancer, such as those with a weakened  immune system or those who have been exposed to the drug diethylstilbestrol (DES), may need more frequent testing. Making other lifestyle changes  Do not use any products that contain nicotine or tobacco, such as cigarettes and e-cigarettes. If you need help quitting, ask your health care provider.  Eat at least 5 servings of fruits and vegetables every day.  Lose weight if you are overweight. Why are these changes important?  These changes and screening tests are designed to address the factors that are known to increase the risk of cervical cancer. Taking these steps is the best way to reduce your risk.  Having regular Pap tests will help identify changes in cells that could lead to cancer. Steps can then be taken to prevent cancer from developing.  These changes will also help find cervical cancer early. This type of cancer can be treated effectively if it is found early. It can be more dangerous and difficult to treat if cancer has grown deep into your cervix or has spread.  In addition to making you less likely to get cervical cancer, these changes will also provide other health benefits, such as the following:  Practicing safe sex is important for preventing STIs and unplanned pregnancies.  Avoiding tobacco can reduce your risk for other cancers and health issues.  Eating a healthy diet and maintaining a healthy weight are good for your overall health. What can happen if changes are not made? In the  early stages, cervical cancer might not have any symptoms. It can take many years for the cancer to grow and get deep into the cervix tissue. This may be happening without you knowing about it. If you develop any symptoms, such as pelvic pain or unusual discharge or bleeding from your vagina, you should see your health care provider right away. If cervical cancer is not found early, you might need treatments such as radiation, chemotherapy, or surgery. In some cases, surgery may mean that  you will not be able to get pregnant or carry a pregnancy to term. Where to find support: Talk with your health care provider, school nurse, or local health department for guidance about screening and vaccination. Some children and teens may be able to get the HPV vaccine free of charge through the U.S. government's Vaccines for Children [facility]) program. Other places that provide vaccinations include:  Public health clinics. Check with your local health department.  [place], where you would pay only what you can afford. To find one near you, check this website: http://lyons.com/  [place]. These are part of a program for Medicare and Medicaid patients who live in rural areas. The National Breast and Cervical Cancer Early Detection Program also provides breast and cervical cancer screenings and diagnostic services to low-income, uninsured, and underinsured women. Cervical cancer can be passed down through families. Talk with your health care provider or genetic counselor to learn more about genetic testing for cancer. Where to find more information: Learn more about cervical cancer from:  [employer] of Gynecology: WirelessShades.ch  American Cancer Society: www.cancer.org/cancer/cervicalcancer/  U.S. Centers for Disease Control and Prevention: ParisianParasols.gl Summary  Talk with your health care provider about getting the HPV vaccine.  Be sure to get regular Pap tests as recommended by your health care provider.  See your health care provider right away if you have any pelvic pain or unusual discharge or bleeding from your vagina. This information is not intended to replace advice given to you by your health care provider. Make sure you discuss any questions you have with your health care provider. Document Released: 06/19/2015 Document Revised: 01/31/2016 Document Reviewed: 01/31/2016 Elsevier  Interactive Patient Education  2017 Elsevier Inc.      Human Papillomavirus Human papillomavirus (HPV) is the most common sexually transmitted infection (STI) and is highly contagious. HPV infections cause genital warts and cancers to the outlet of the womb (cervix), birth canal (vagina), opening of the birth canal (vulva), and anus. There are over 100 types of HPV. Unless wartlike lesions are present in the throat or there are genital warts that you can see or feel, HPV usually does not cause symptoms. It is possible to be infected for long periods and pass it on to others without knowing it. What are the causes? HPV is spread from person to person through sexual contact. This includes oral, vaginal, or anal sex. What increases the risk?  Having unprotected sex. HPV can be spread by oral, vaginal, or anal sex.  Having several sex partners.  Having a sex partner who has other sex partners.  Having or having had another sexually transmitted infection. What are the signs or symptoms? Most people carrying HPV do not have any symptoms. If symptoms are present, symptoms may include:  Wartlike lesions in the throat (from having oral sex).  Warts in the infected skin or mucous membranes.  Genital warts that may itch, burn, or bleed.  Genital warts that may be painful  or bleed during sexual intercourse. How is this diagnosed? If wartlike lesions are present in the throat or genital warts are present, your health care provider can usually diagnose HPV by physical examination.  Genital warts are easily seen with the naked eye.  Currently, there is no FDA-approved test to detect HPV in males.  In females, a Pap test can show cells that are infected with HPV.  In females, a scope can be used to view the cervix (colposcopy). A colposcopy can be performed if the pelvic exam or Pap test is abnormal. A sample of tissue may be removed (biopsy) during the colposcopy. How is this treated? There  is no treatment for the virus itself. However, there are treatments for the health problems and symptoms HPV can cause. Your health care provider will follow you closely after you are treated. This is because the HPV can come back and may need treatment again. Treatment of HPV may include:  Medicines, which may be injected or applied in a cream, lotion, or gel form.  Use of a probe to apply extreme cold (cryotherapy).  Application of an intense beam of light (laser treatment).  Use of a probe to apply extreme heat (electrocautery).  Surgery. Follow these instructions at home:  Take medicines only as directed by your health care provider.  Use over-the-counter creams for itching or irritation as directed by your health care provider.  Keep all follow-up visits as directed by your health care provider. This is important.  Do not touch or scratch the warts.  Do not treat genital warts with medicines used for treating hand warts.  Do not have sex while you are being treated.  Do not douche or use tampons during treatment of HPV.  Tell your sex partner about your infection because he or she may also need treatment.  If you become pregnant, tell your health care provider that you have had HPV. Your health care provider will monitor you closely during pregnancy to be sure your baby is safe.  After treatment, use condoms during sex to prevent future infections.  Have only one sex partner.  Have a sex partner who does not have other sex partners. How is this prevented?  Talk to your health care provider about getting the HPV vaccines. These vaccines prevent some HPV infections and cancers. It is recommended that the vaccine be given to males and females between the ages of 69 and 44 years old. It will not work if you already have HPV, and it is not recommended for pregnant women.  A Pap test is done to screen for cervical cancer in women.  The first Pap test should be done at age 57  years.  Between ages 12 and 97 years, Pap tests are repeated every 2 years.  Beginning at age 3, you are advised to have a Pap test every 3 years as long as your past 3 Pap tests have been normal.  Some women have medical problems that increase the chance of getting cervical cancer. Talk to your health care provider about these problems. It is especially important to talk to your health care provider if a new problem develops soon after your last Pap test. In these cases, your health care provider may recommend more frequent screening and Pap tests.  The above recommendations are the same for women who have or have not gotten the vaccine for HPV.  If you had a hysterectomy for a problem that was not a cancer or a condition  that could lead to cancer, then you no longer need Pap tests. However, even if you no longer need a Pap test, a regular exam is a good idea to make sure no other problems are starting.  If you are between the ages of 55 and 15 years and you have had normal Pap tests going back 10 years, you no longer need Pap tests. However, even if you no longer need a Pap test, a regular exam is a good idea to make sure no other problems are starting.  If you have had past treatment for cervical cancer or a condition that could lead to cancer, you need Pap tests and screening for cancer for at least 20 years after your treatment.  If Pap tests have been discontinued, risk factors (such as a new sexual partner)need to be reassessed to determine if screening should be resumed.  Some women may need screenings more often if they are at high risk for cervical cancer. Contact a health care provider if:  The treated skin becomes red, swollen, or painful.  You have a fever.  You feel generally ill.  You feel lumps or pimple-like projections in and around your genital area.  You develop bleeding of the vagina or the treatment area.  You have painful sexual intercourse. This information  is not intended to replace advice given to you by your health care provider. Make sure you discuss any questions you have with your health care provider. Document Released: 08/25/2003 Document Revised: 11/10/2015 Document Reviewed: 09/09/2013 Elsevier Interactive Patient Education  2017 [employer].

## 2016-06-05 NOTE — Progress Notes (Signed)
GYNECOLOGY PROGRESS NOTE  Subjective:    Patient ID: Jocelyn Harris, female    DOB: 03-28-1951, 65 y.o.   MRN: [id number]  HPI  Patient is a 65 y.o. G1P0 female who presents as a referral from [facility] for abnormal pap smear (NILM but HR HPV+).  Patient notes a remote h/o abnormal pap smears.     Past Medical History:  Diagnosis Date  . GERD (gastroesophageal reflux disease)   . Hypertension     Family History  Problem Relation Age of Onset  . Diabetes Mother   . Hypertension Mother   . Congestive Heart Failure Mother   . Hypertension Father   . Lung cancer Father   . Fibromyalgia Sister   . Lupus Sister   . Cancer Sister     of blood   Past Surgical History:  Procedure Laterality Date  . KNEE SURGERY Right   . TOE SURGERY    . TUBAL LIGATION      Social History   Social History  . Marital status: Divorced    Spouse name: N/A  . Number of children: N/A  . Years of education: N/A   Occupational History  . Not on file.   Social History Main Topics  . Smoking status: Never Smoker  . Smokeless tobacco: Never Used  . Alcohol use No  . Drug use: No  . Sexual activity: Not Currently    Birth control/ protection: None   Other Topics Concern  . Not on file   Social History Narrative  . No narrative on file    Current Outpatient Prescriptions on File Prior to Visit  Medication Sig Dispense Refill  . calcium gluconate 500 MG tablet Take by mouth.    . Cholecalciferol 1000 UNITS capsule Take by mouth.    . cyclobenzaprine (FLEXERIL) 10 MG tablet Take 1 tablet (10 mg total) by mouth 3 (three) times daily as needed for muscle spasms. 30 tablet 5  . fexofenadine (EQ ALLERGY RELIEF) 180 MG tablet Take 1 tablet (180 mg total) by mouth daily. 30 tablet 11  . fluticasone (FLONASE) 50 MCG/ACT nasal spray Place 2 sprays into both nostrils daily. 16 g 12  . ibuprofen (ADVIL,MOTRIN) 800 MG tablet Take 1 tablet (800 mg total) by mouth every 8  (eight) hours as needed. 60 tablet 12  . montelukast (SINGULAIR) 10 MG tablet Take 1 tablet (10 mg total) by mouth at bedtime. 30 tablet 12  . MULTIPLE VITAMINS PO Take by mouth.    Marland Kitchen omeprazole (PRILOSEC) 20 MG capsule Take 1 capsule (20 mg total) by mouth every morning. 30 capsule 3  . ranitidine (ZANTAC) 150 MG tablet Take 1 tablet (150 mg total) by mouth 2 (two) times daily. 60 tablet 12  . traZODone (DESYREL) 150 MG tablet Take 1 tablet (150 mg total) by mouth at bedtime. 30 tablet 12  . busPIRone (BUSPAR) 10 MG tablet Take 1 tablet (10 mg total) by mouth 3 (three) times daily. 90 tablet 12  . conjugated estrogens (PREMARIN) vaginal cream Place 1 Applicatorful vaginally daily. 42.5 g 12  . lisinopril (PRINIVIL,ZESTRIL) 10 MG tablet Take 1 tablet (10 mg total) by mouth daily. 30 tablet 12  . vitamin E 100 UNIT capsule Take by mouth.     No current facility-administered medications on file prior to visit.     Allergies  Allergen Reactions  . Augmentin [Amoxicillin-Pot Clavulanate] Nausea Only    Patient reports that her reflux was bad when  she took medication  . Doxycycline Nausea Only and Nausea And Vomiting     Review of Systems A comprehensive review of systems was negative except for: Genitourinary: positive for dyspareunia, vaginal dryness.  Notes that partner is also well-endowed that adds to discomfort.  Notes using Premarin cream in the past, but has not used in at least 6-12 months.    Objjective:   Blood pressure 133/73, pulse 70, height 5' 0.75" (1.543 m), weight 122 lb (55.3 kg). General appearance: alert and no distress Abdomen: soft, non-tender; bowel sounds normal; no masses,  no organomegaly Pelvic: external genitalia normal and no bladder tenderness.  Vaginal atrophy present.  Cervix normal appearing, no lesions.  Bimanual exam not done.     Labs:   03/22/2016: Pap negative, HR HPV+  Assessment:   NILM, HR HPV + pap smear Dyspareunia Vaginal  atrophy  Plan:   - Patient with normal Pap smear, but HPV high risk positive.  Per ASCCP guidelines, patient can have a repeat Pap smear 1 year with HR HPV typing, or may perform HPV typing today.  Patient desires to wait 1 year for repeat pap smear.  To f/u in 1 year.  - Advised patient that she should resume using premarin cream for for vaginal atrophy and dyspareunia.  Samples given.  Can also use alternative sexual positions to control depth of penetration by partner, and also can use penile ring.    Rubie Maid, MD [facility]

## 2016-06-07 ENCOUNTER — Ambulatory Visit (INDEPENDENT_AMBULATORY_CARE_PROVIDER_SITE_OTHER): Payer: BLUE CROSS/BLUE SHIELD | Admitting: Family Medicine

## 2016-06-07 ENCOUNTER — Encounter: Payer: Self-pay | Admitting: Family Medicine

## 2016-06-07 VITALS — BP 130/74 | HR 70 | Temp 98.4°F | Resp 16 | Wt 122.4 lb

## 2016-06-07 DIAGNOSIS — H5712 Ocular pain, left eye: Secondary | ICD-10-CM | POA: Diagnosis not present

## 2016-06-07 DIAGNOSIS — B9789 Other viral agents as the cause of diseases classified elsewhere: Secondary | ICD-10-CM | POA: Diagnosis not present

## 2016-06-07 DIAGNOSIS — J069 Acute upper respiratory infection, unspecified: Secondary | ICD-10-CM

## 2016-06-07 MED ORDER — CEFDINIR 300 MG PO CAPS: 300.0000 mg | ORAL_CAPSULE | Freq: Two times a day (BID) | ORAL | 0 refills | Status: DC

## 2016-06-07 NOTE — Patient Instructions (Signed)
Discussed use of Mucinex D for congestion and Delsym for cough. Use frequent wet, warm compresses to your eye. If your sinuses are not improving over the weekend start the antibiotics.

## 2016-06-07 NOTE — Progress Notes (Signed)
Subjective:     Patient ID: Jocelyn Harris, female   DOB: 08-Jun-1951, 65 y.o.   MRN: [id number]  HPI  Chief Complaint  Patient presents with  . Itchy Eye    Patient comes in office today with concerns of her left eye being itchy for the past week. Patient states initially she thought she had a hair in her eye until she noticed a yellow spot on her eye causing irritation.   . Facial Pain    Patient complains of sinus pain and sore throat for the past 5 days.   Reports viral sx onset on or about 12/16. Has been taking otc Mucinex and Robitussin for her sx.   Review of Systems     Objective:   Physical Exam  Eyes: Left eye exhibits no discharge.  No f.b.or scleral injection  Ears: Right TM intact without inflammation. Left TM obscured by cerumen Sinuses: mild paranasal sinus tenderness Throat: no tonsillar enlargement or exudate Neck: no cervical adenopathy Lungs: clear     Assessment:    1. Viral upper respiratory tract infection - cefdinir (OMNICEF) 300 MG capsule; Take 1 capsule (300 mg total) by mouth 2 (two) times daily.  Dispense: 20 capsule; Refill: 0  2. Eye discomfort, left    Plan:    Discussed use of Mucinex D and Delsym. Start abx if sinuses not improving over the next 2-3 days.

## 2016-06-10 DIAGNOSIS — R8789 Other abnormal findings in specimens from female genital organs: Secondary | ICD-10-CM | POA: Insufficient documentation

## 2016-06-10 DIAGNOSIS — R87618 Other abnormal cytological findings on specimens from cervix uteri: Secondary | ICD-10-CM | POA: Insufficient documentation

## 2016-07-03 ENCOUNTER — Other Ambulatory Visit: Payer: Self-pay | Admitting: Family Medicine

## 2016-07-03 MED ORDER — LISINOPRIL 10 MG PO TABS: 10.0000 mg | ORAL_TABLET | Freq: Every day | ORAL | 6 refills | Status: DC

## 2016-07-03 MED ORDER — TRAZODONE HCL 150 MG PO TABS: 150.0000 mg | ORAL_TABLET | Freq: Every day | ORAL | 6 refills | Status: DC

## 2016-07-03 NOTE — Telephone Encounter (Signed)
Pt contacted office for refill request on the following medications: 1. traZODone (DESYREL) 150 MG tablet  2. lisinopril (PRINIVIL,ZESTRIL) 10 MG tablet Last Rx: 07/08/15 with 12 refills LOV: 05/31/16 Pt stated that she planned on scheduling a F/U OV but with all the sickness going around she would rather wait. Please advise. Thanks TNP

## 2016-07-03 NOTE — Telephone Encounter (Signed)
Ok for 6 months 

## 2016-07-03 NOTE — Telephone Encounter (Signed)
Please advise  ED?

## 2016-07-03 NOTE — Telephone Encounter (Signed)
Sent in medication as below.  

## 2016-07-09 ENCOUNTER — Other Ambulatory Visit: Payer: Self-pay | Admitting: Family Medicine

## 2016-07-09 NOTE — Telephone Encounter (Signed)
Please review-aa 

## 2016-07-18 ENCOUNTER — Other Ambulatory Visit: Payer: Self-pay | Admitting: Family Medicine

## 2016-07-18 DIAGNOSIS — K219 Gastro-esophageal reflux disease without esophagitis: Secondary | ICD-10-CM

## 2016-09-07 ENCOUNTER — Other Ambulatory Visit: Payer: Self-pay | Admitting: Family Medicine

## 2016-09-20 ENCOUNTER — Other Ambulatory Visit: Payer: Self-pay | Admitting: Family Medicine

## 2016-10-17 ENCOUNTER — Other Ambulatory Visit: Payer: Self-pay | Admitting: Family Medicine

## 2016-10-17 DIAGNOSIS — J309 Allergic rhinitis, unspecified: Secondary | ICD-10-CM

## 2016-11-04 ENCOUNTER — Other Ambulatory Visit: Payer: Self-pay | Admitting: Physician Assistant

## 2016-11-14 ENCOUNTER — Encounter: Payer: Self-pay | Admitting: Family Medicine

## 2016-11-14 ENCOUNTER — Ambulatory Visit (INDEPENDENT_AMBULATORY_CARE_PROVIDER_SITE_OTHER): Payer: BLUE CROSS/BLUE SHIELD | Admitting: Family Medicine

## 2016-11-14 VITALS — BP 124/60 | HR 62 | Temp 97.6°F | Resp 16 | Wt 122.0 lb

## 2016-11-14 DIAGNOSIS — F329 Major depressive disorder, single episode, unspecified: Secondary | ICD-10-CM | POA: Diagnosis not present

## 2016-11-14 DIAGNOSIS — J3089 Other allergic rhinitis: Secondary | ICD-10-CM | POA: Diagnosis not present

## 2016-11-14 DIAGNOSIS — K219 Gastro-esophageal reflux disease without esophagitis: Secondary | ICD-10-CM | POA: Diagnosis not present

## 2016-11-14 DIAGNOSIS — F32A Depression, unspecified: Secondary | ICD-10-CM

## 2016-11-14 DIAGNOSIS — H11002 Unspecified pterygium of left eye: Secondary | ICD-10-CM

## 2016-11-14 MED ORDER — FLUTICASONE PROPIONATE 50 MCG/ACT NA SUSP: 2.0000 | Freq: Every day | NASAL | 12 refills | Status: DC

## 2016-11-14 MED ORDER — BUSPIRONE HCL 10 MG PO TABS: 10.0000 mg | ORAL_TABLET | Freq: Three times a day (TID) | ORAL | 11 refills | Status: DC

## 2016-11-14 MED ORDER — RANITIDINE HCL 150 MG PO TABS: 150.0000 mg | ORAL_TABLET | Freq: Two times a day (BID) | ORAL | 11 refills | Status: DC

## 2016-11-14 MED ORDER — TRAZODONE HCL 150 MG PO TABS: 150.0000 mg | ORAL_TABLET | Freq: Every day | ORAL | 11 refills | Status: DC

## 2016-11-14 NOTE — Progress Notes (Signed)
Subjective:  HPI Pt is here today because she has been having itching, burning and redness of her left eye since December. She had her eye exam prior to this starting that and he did not see anything. She reports that she does have some drainage from her eye and sometimes blurry vision.  No matting of eyes.  Prior to Admission medications   Medication Sig Start Date End Date Taking? Authorizing Provider  busPIRone (BUSPAR) 10 MG tablet TAKE 1 TABLET BY MOUTH THREE TIMES DAILY 09/07/16   Jerrol Banana., MD  calcium gluconate 500 MG tablet Take by mouth.    [provider]  cefdinir (OMNICEF) 300 MG capsule Take 1 capsule (300 mg total) by mouth 2 (two) times daily. 06/07/16   Carmon Ginsberg, PA  Cholecalciferol 1000 UNITS capsule Take by mouth.    [provider]  conjugated estrogens (PREMARIN) vaginal cream Place 1 Applicatorful vaginally daily. 08/03/15   Jerrol Banana., MD  cyclobenzaprine (FLEXERIL) 10 MG tablet Take 1 tablet (10 mg total) by mouth 3 (three) times daily as needed for muscle spasms. 01/17/16   Jerrol Banana., MD  fexofenadine (EQ ALLERGY RELIEF) 180 MG tablet Take 1 tablet (180 mg total) by mouth daily. 05/31/16   Jerrol Banana., MD  fluticasone [facility]) 50 MCG/ACT nasal spray Place 2 sprays into both nostrils daily. 08/29/15   Jerrol Banana., MD  ibuprofen (ADVIL,MOTRIN) 800 MG tablet TAKE 1 TABLET BY MOUTH EVERY 8 HOURS AS NEEDED 11/05/16   Jerrol Banana., MD  lisinopril (PRINIVIL,ZESTRIL) 10 MG tablet Take 1 tablet (10 mg total) by mouth daily. 07/03/16   Jerrol Banana., MD  lisinopril (PRINIVIL,ZESTRIL) 10 MG tablet TAKE 1 TABLET BY MOUTH EVERY DAY 07/19/16   Jerrol Banana., MD  montelukast (SINGULAIR) 10 MG tablet TAKE 1 TABLET( 10 MG TOTAL) BY MOUTH AT BEDTIME 10/18/16   Jerrol Banana., MD  MULTIPLE VITAMINS PO Take by mouth.    [provider]  omeprazole (PRILOSEC) 20 MG  capsule TAKE ONE CAPSULE BY MOUTH EVERY MORNING 07/18/16   Jerrol Banana., MD  ranitidine (ZANTAC) 150 MG tablet TAKE 1 TABLET BY MOUTH TWICE DAILY 09/20/16   Trinna Post, PA-C  traZODone (DESYREL) 150 MG tablet Take 1 tablet (150 mg total) by mouth at bedtime. 07/03/16   Jerrol Banana., MD  traZODone (DESYREL) 150 MG tablet TAKE 1 TABLET BY MOUTH EVERY NIGHT AT BEDTIME 07/09/16   Jerrol Banana., MD  vitamin E 100 UNIT capsule Take by mouth.    [provider]    Patient Active Problem List   Diagnosis Date Noted  . Pap smear abnormality of cervix/human papillomavirus (HPV) positive 06/10/2016  . Allergic rhinitis 01/27/2015  . Anxiety 01/27/2015  . Back pain, chronic 01/27/2015  . Clinical depression 01/27/2015  . Acid reflux 01/27/2015  . HLD (hyperlipidemia) 01/27/2015  . BP (high blood pressure) 01/27/2015  . Cannot sleep 01/27/2015  . 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 GERD (gastroesophageal reflux disease)   . Hypertension     Social History   Social History  . Marital status: Divorced    Spouse name: N/A  . Number of children: N/A  . Years of education: N/A   Occupational History  . Not on file.   Social History Main Topics  . Smoking status: Never  Smoker  . Smokeless tobacco: Never Used  . Alcohol use No  . Drug use: No  . Sexual activity: Not Currently    Birth control/ protection: None   Other Topics Concern  . Not on file   Social History Narrative  . No narrative on file    Allergies  Allergen Reactions  . Augmentin [Amoxicillin-Pot Clavulanate] Nausea Only    Patient reports that her reflux was bad when she took medication  . Doxycycline Nausea Only and Nausea And Vomiting    Review of Systems  Constitutional: Negative.   HENT: Negative.   Eyes: Positive for blurred vision, pain, discharge and redness.  Respiratory: Negative.   Cardiovascular: Negative.     Gastrointestinal: Negative.   Genitourinary: Negative.   Musculoskeletal: Negative.   Skin: Negative.   Neurological: Negative.   Endo/Heme/Allergies: Negative.   Psychiatric/Behavioral: Negative.     Immunization History  Administered Date(s) Administered  . Influenza-Unspecified 06/16/2015  . Tdap 12/23/2013  . Zoster 01/20/2014    Objective:  BP 124/60 (BP Location: Left Arm, Patient Position: Sitting, Cuff Size: Normal)   Pulse 62   Temp 97.6 F (36.4 C) (Oral)   Resp 16   Wt 122 lb (55.3 kg)   BMI 23.24 kg/m   Physical Exam  Constitutional: She is oriented to person, place, and time and well-developed, well-nourished, and in no distress.  Eyes: Conjunctivae and EOM are normal. Pupils are equal, round, and reactive to light.  Neck: Normal range of motion. Neck supple.  Cardiovascular: Normal rate, regular rhythm, normal heart sounds and intact distal pulses.   Pulmonary/Chest: Effort normal and breath sounds normal.  Neurological: She is alert and oriented to person, place, and time. She has normal reflexes. Gait normal. GCS score is 15.  Skin: Skin is warm and dry.  Psychiatric: Mood, memory, affect and judgment normal.    Lab Results  Component Value Date   WBC 9.4 03/22/2016   HGB 12.6 12/23/2013   HCT 36.9 03/22/2016   PLT 311 03/22/2016   GLUCOSE 87 03/22/2016   CHOL 189 03/22/2016   TRIG 155 (H) 03/22/2016   HDL 59 03/22/2016   LDLCALC 99 03/22/2016   TSH 2.210 03/22/2016    CMP     Component Value Date/Time   NA 143 03/22/2016 1139   K 4.1 03/22/2016 1139   CL 102 03/22/2016 1139   CO2 26 03/22/2016 1139   GLUCOSE 87 03/22/2016 1139   BUN 20 03/22/2016 1139   CREATININE 0.76 03/22/2016 1139   CALCIUM 9.8 03/22/2016 1139   PROT 6.9 03/22/2016 1139   ALBUMIN 4.7 03/22/2016 1139   AST 21 03/22/2016 1139   ALT 19 03/22/2016 1139   ALKPHOS 72 03/22/2016 1139   BILITOT 0.4 03/22/2016 1139   GFRNONAA 84 03/22/2016 1139   GFRAA 97 03/22/2016  1139    Assessment and Plan :  1. Pterygium of left eye Pt to see Dr. Chong Sicilian and treat allergies. Symptoms could also be blocked tear duct.  2. Seasonal allergic rhinitis due to other allergic trigger Continue Singulair and Allegra along with restarting Flonase.  - fluticasone (FLONASE) 50 MCG/ACT nasal spray; Place 2 sprays into both nostrils daily.  Dispense: 16 g; Refill: 12  3. Depression, unspecified depression type Pulled down for refills purposes only  - traZODone (DESYREL) 150 MG tablet; Take 1 tablet (150 mg total) by mouth at bedtime.  Dispense: 30 tablet; Refill: 11 - busPIRone (BUSPAR) 10 MG tablet; Take 1 tablet (10 mg  total) by mouth 3 (three) times daily.  Dispense: 90 tablet; Refill: 11  4. Gastroesophageal reflux disease, esophagitis presence not specified Pulled down for refills purposes only  - ranitidine (ZANTAC) 150 MG tablet; Take 1 tablet (150 mg total) by mouth 2 (two) times daily.  Dispense: 60 tablet; Refill: 11   HPI, Exam, and A&P Transcribed under the direction and in the presence of Richard L. Cranford Mon, MD  Electronically Signed: Katina Dung, CMA I have done the exam and reviewed the above chart and it is accurate to the best of my knowledge. Development worker, community has been used in this note in any air is in the dictation or transcription are unintentional.  [facility] 11/14/2016 3:37 PM

## 2016-11-14 NOTE — Patient Instructions (Addendum)
See Dr. Chong Sicilian to reevaluate your eye. Try Nasal spray

## 2016-11-17 ENCOUNTER — Ambulatory Visit: Payer: Self-pay | Admitting: Family Medicine

## 2017-01-17 ENCOUNTER — Ambulatory Visit (INDEPENDENT_AMBULATORY_CARE_PROVIDER_SITE_OTHER): Payer: BLUE CROSS/BLUE SHIELD | Admitting: Family Medicine

## 2017-01-17 ENCOUNTER — Encounter: Payer: Self-pay | Admitting: Family Medicine

## 2017-01-17 VITALS — BP 144/70 | HR 56 | Temp 97.3°F | Resp 16 | Wt 123.0 lb

## 2017-01-17 DIAGNOSIS — J320 Chronic maxillary sinusitis: Secondary | ICD-10-CM

## 2017-01-17 DIAGNOSIS — J069 Acute upper respiratory infection, unspecified: Secondary | ICD-10-CM

## 2017-01-17 MED ORDER — AZITHROMYCIN 250 MG PO TABS: ORAL_TABLET | ORAL | 0 refills | Status: DC

## 2017-01-17 NOTE — Progress Notes (Signed)
Subjective:  HPI Pt reports that she started having cold symptoms about a week ago. She has sinus congestion, sore throat, both ears are bothering her, she has post nasal drainage, cough, sinus pain and pressure, shortness of breath. She reports that she feels like her reflux is worse, she has burning in her throat, nausea and poor appetite.   Prior to Admission medications   Medication Sig Start Date End Date Taking? Authorizing Provider  busPIRone (BUSPAR) 10 MG tablet Take 1 tablet (10 mg total) by mouth 3 (three) times daily. 11/14/16   Jerrol Banana., MD  calcium gluconate 500 MG tablet Take by mouth.    [provider]  Cholecalciferol 1000 UNITS capsule Take by mouth.    [provider]  conjugated estrogens (PREMARIN) vaginal cream Place 1 Applicatorful vaginally daily. Patient not taking: Reported on 11/14/2016 08/03/15   Jerrol Banana., MD  cyclobenzaprine (FLEXERIL) 10 MG tablet Take 1 tablet (10 mg total) by mouth 3 (three) times daily as needed for muscle spasms. Patient not taking: Reported on 11/14/2016 01/17/16   Jerrol Banana., MD  fexofenadine [facility] ALLERGY RELIEF) 180 MG tablet Take 1 tablet (180 mg total) by mouth daily. 05/31/16   Jerrol Banana., MD  fluticasone [facility]) 50 MCG/ACT nasal spray Place 2 sprays into both nostrils daily. 11/14/16   Jerrol Banana., MD  ibuprofen (ADVIL,MOTRIN) 800 MG tablet TAKE 1 TABLET BY MOUTH EVERY 8 HOURS AS NEEDED 11/05/16   Jerrol Banana., MD  lisinopril (PRINIVIL,ZESTRIL) 10 MG tablet Take 1 tablet (10 mg total) by mouth daily. 07/03/16   Jerrol Banana., MD  montelukast (SINGULAIR) 10 MG tablet TAKE 1 TABLET( 10 MG TOTAL) BY MOUTH AT BEDTIME 10/18/16   Jerrol Banana., MD  MULTIPLE VITAMINS PO Take by mouth.    [provider]  omeprazole (PRILOSEC) 20 MG capsule TAKE ONE CAPSULE BY MOUTH EVERY MORNING 07/18/16   Jerrol Banana., MD  ranitidine  (ZANTAC) 150 MG tablet Take 1 tablet (150 mg total) by mouth 2 (two) times daily. 11/14/16   Jerrol Banana., MD  traZODone (DESYREL) 150 MG tablet Take 1 tablet (150 mg total) by mouth at bedtime. 11/14/16   Jerrol Banana., MD  vitamin E 100 UNIT capsule Take by mouth.    [provider]    Patient Active Problem List   Diagnosis Date Noted  . Pap smear abnormality of cervix/human papillomavirus (HPV) positive 06/10/2016  . Allergic rhinitis 01/27/2015  . Anxiety 01/27/2015  . Back pain, chronic 01/27/2015  . Clinical depression 01/27/2015  . Acid reflux 01/27/2015  . HLD (hyperlipidemia) 01/27/2015  . BP (high blood pressure) 01/27/2015  . Cannot sleep 01/27/2015  . 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 GERD (gastroesophageal reflux disease)   . Hypertension     Social History   Social History  . Marital status: Divorced    Spouse name: N/A  . Number of children: N/A  . Years of education: N/A   Occupational History  . Not on file.   Social History Main Topics  . Smoking status: Never Smoker  . Smokeless tobacco: Never Used  . Alcohol use No  . Drug use: No  . Sexual activity: Not Currently    Birth control/ protection: None   Other Topics Concern  . Not on file   Social History  Narrative  . No narrative on file    Allergies  Allergen Reactions  . Augmentin [Amoxicillin-Pot Clavulanate] Nausea Only    Patient reports that her reflux was bad when she took medication  . Doxycycline Nausea Only and Nausea And Vomiting    Review of Systems  Constitutional: Positive for malaise/fatigue.  HENT: Positive for congestion, ear pain, sinus pain and sore throat.        Post nasal drainage   Eyes: Negative.   Respiratory: Positive for cough and shortness of breath.   Cardiovascular: Negative.   Gastrointestinal: Positive for heartburn and nausea.  Genitourinary: Negative.   Musculoskeletal: Negative.    Skin: Negative.   Neurological: Positive for headaches.  Endo/Heme/Allergies: Negative.   Psychiatric/Behavioral: Negative.     Immunization History  Administered Date(s) Administered  . Influenza-Unspecified 06/16/2015  . Tdap 12/23/2013  . Zoster 01/20/2014    Objective:  BP (!) 144/70 (BP Location: Left Arm, Patient Position: Sitting, Cuff Size: Normal)   Pulse (!) 56   Temp (!) 97.3 F (36.3 C) (Oral)   Resp 16   Wt 123 lb (55.8 kg)   SpO2 96%   BMI 23.43 kg/m   Physical Exam  Constitutional: She is oriented to person, place, and time and well-developed, well-nourished, and in no distress.  HENT:  Head: Normocephalic and atraumatic.  Right Ear: External ear normal.  Left Ear: External ear normal.  Nose: Nose normal.  Mouth/Throat: Oropharynx is clear and moist.  Eyes: Conjunctivae are normal. No scleral icterus.  Neck: No thyromegaly present.  Cardiovascular: Normal rate, regular rhythm and normal heart sounds.   Pulmonary/Chest: Effort normal and breath sounds normal.  Abdominal: Soft.  Neurological: She is alert and oriented to person, place, and time. Gait normal. GCS score is 15.  Skin: Skin is warm and dry.  Psychiatric: Mood, memory, affect and judgment normal.    Lab Results  Component Value Date   WBC 9.4 03/22/2016   HGB 12.0 03/22/2016   HCT 36.9 03/22/2016   PLT 311 03/22/2016   GLUCOSE 87 03/22/2016   CHOL 189 03/22/2016   TRIG 155 (H) 03/22/2016   HDL 59 03/22/2016   LDLCALC 99 03/22/2016   TSH 2.210 03/22/2016    CMP     Component Value Date/Time   NA 143 03/22/2016 1139   K 4.1 03/22/2016 1139   CL 102 03/22/2016 1139   CO2 26 03/22/2016 1139   GLUCOSE 87 03/22/2016 1139   BUN 20 03/22/2016 1139   CREATININE 0.76 03/22/2016 1139   CALCIUM 9.8 03/22/2016 1139   PROT 6.9 03/22/2016 1139   ALBUMIN 4.7 03/22/2016 1139   AST 21 03/22/2016 1139   ALT 19 03/22/2016 1139   ALKPHOS 72 03/22/2016 1139   BILITOT 0.4 03/22/2016 1139    GFRNONAA 84 03/22/2016 1139   GFRAA 97 03/22/2016 1139    Assessment and Plan :   URI Use saline/netti pot. Cover for maxillary sinusitis if she worsens. [facility] 01/17/2017 4:33 PM

## 2017-02-15 ENCOUNTER — Emergency Department
Admission: EM | Admit: 2017-02-15 | Discharge: 2017-02-15 | Disposition: A | Discharge status: Home / Self Care | Payer: BLUE CROSS/BLUE SHIELD | Attending: Emergency Medicine | Admitting: Emergency Medicine

## 2017-02-15 ENCOUNTER — Encounter: Payer: Self-pay | Admitting: Emergency Medicine

## 2017-02-15 ENCOUNTER — Emergency Department: Payer: BLUE CROSS/BLUE SHIELD

## 2017-02-15 DIAGNOSIS — Y998 Other external cause status: Secondary | ICD-10-CM | POA: Insufficient documentation

## 2017-02-15 DIAGNOSIS — S0083XA Contusion of other part of head, initial encounter: Secondary | ICD-10-CM | POA: Diagnosis not present

## 2017-02-15 DIAGNOSIS — W2209XA Striking against other stationary object, initial encounter: Secondary | ICD-10-CM | POA: Insufficient documentation

## 2017-02-15 DIAGNOSIS — Y92018 Other place in single-family (private) house as the place of occurrence of the external cause: Secondary | ICD-10-CM | POA: Diagnosis not present

## 2017-02-15 DIAGNOSIS — Y939 Activity, unspecified: Secondary | ICD-10-CM | POA: Diagnosis not present

## 2017-02-15 DIAGNOSIS — I10 Essential (primary) hypertension: Secondary | ICD-10-CM | POA: Insufficient documentation

## 2017-02-15 DIAGNOSIS — S0181XA Laceration without foreign body of other part of head, initial encounter: Secondary | ICD-10-CM

## 2017-02-15 DIAGNOSIS — S0990XA Unspecified injury of head, initial encounter: Secondary | ICD-10-CM | POA: Diagnosis present

## 2017-02-15 DIAGNOSIS — Z79899 Other long term (current) drug therapy: Secondary | ICD-10-CM | POA: Diagnosis not present

## 2017-02-15 MED ORDER — LIDOCAINE-EPINEPHRINE-TETRACAINE (LET) SOLUTION
3.0000 mL | Freq: Once | NASAL | Status: AC
  Administered 2017-02-15: 3 mL via TOPICAL
  Filled 2017-02-15: qty 3
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
## 2017-02-15 NOTE — ED Provider Notes (Signed)
[facility] Emergency Department Provider Note  ____________________________________________  Time seen: Approximately 4:06 PM  I have reviewed the triage vital signs and the nursing notes.   HISTORY  Chief Complaint Head Injury    HPI Jocelyn Harris is a 66 y.o. female presenting to the emergency department with a right forehead laceration after patient was struck by a sharp object in her attic. Patient states that she experienced momentary loss of consciousness. She denies blurry vision, abdominal pain and nausea. He denies neck pain, back pain, changes in sensation of the upper or lower extremities, weakness or radiculopathy. She has been ambulating without difficulty. She denies confusion or disorientation. Tetanus status is up-to-date.   Past Medical History:  Diagnosis Date  . GERD (gastroesophageal reflux disease)   . Hypertension     Patient Active Problem List   Diagnosis Date Noted  . Pap smear abnormality of cervix/human papillomavirus (HPV) positive 06/10/2016  . Allergic rhinitis 01/27/2015  . Anxiety 01/27/2015  . Back pain, chronic 01/27/2015  . Clinical depression 01/27/2015  . Acid reflux 01/27/2015  . HLD (hyperlipidemia) 01/27/2015  . BP (high blood pressure) 01/27/2015  . Cannot sleep 01/27/2015  . 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 KNEE SURGERY Right   . TOE SURGERY    . TUBAL LIGATION      Prior to Admission medications   Medication Sig Start Date End Date Taking? Authorizing Provider  azithromycin (ZITHROMAX) 250 MG tablet 2 PO day 1 then 1 daily 01/17/17   Jerrol Banana., MD  busPIRone (BUSPAR) 10 MG tablet Take 1 tablet (10 mg total) by mouth 3 (three) times daily. 11/14/16   Jerrol Banana., MD  calcium gluconate 500 MG tablet Take by mouth.    [provider]  Cholecalciferol 1000 UNITS capsule Take by mouth.    [provider]   conjugated estrogens (PREMARIN) vaginal cream Place 1 Applicatorful vaginally daily. Patient not taking: Reported on 01/17/2017 08/03/15   Jerrol Banana., MD  cyclobenzaprine (FLEXERIL) 10 MG tablet Take 1 tablet (10 mg total) by mouth 3 (three) times daily as needed for muscle spasms. Patient not taking: Reported on 11/14/2016 01/17/16   Jerrol Banana., MD  fexofenadine [facility] ALLERGY RELIEF) 180 MG tablet Take 1 tablet (180 mg total) by mouth daily. 05/31/16   Jerrol Banana., MD  fluticasone [facility]) 50 MCG/ACT nasal spray Place 2 sprays into both nostrils daily. 11/14/16   Jerrol Banana., MD  ibuprofen (ADVIL,MOTRIN) 800 MG tablet TAKE 1 TABLET BY MOUTH EVERY 8 HOURS AS NEEDED 11/05/16   Jerrol Banana., MD  lisinopril (PRINIVIL,ZESTRIL) 10 MG tablet Take 1 tablet (10 mg total) by mouth daily. 07/03/16   Jerrol Banana., MD  montelukast (SINGULAIR) 10 MG tablet TAKE 1 TABLET( 10 MG TOTAL) BY MOUTH AT BEDTIME 10/18/16   Jerrol Banana., MD  MULTIPLE VITAMINS PO Take by mouth.    [provider]  omeprazole (PRILOSEC) 20 MG capsule TAKE ONE CAPSULE BY MOUTH EVERY MORNING 07/18/16   Jerrol Banana., MD  ranitidine (ZANTAC) 150 MG tablet Take 1 tablet (150 mg total) by mouth 2 (two) times daily. 11/14/16   Jerrol Banana., MD  traZODone (DESYREL) 150 MG tablet Take 1 tablet (150 mg total) by mouth at bedtime. 11/14/16   Jerrol Banana., MD  vitamin E 100  UNIT capsule Take by mouth.    [provider]    Allergies Augmentin [amoxicillin-pot clavulanate] and Doxycycline  Family History  Problem Relation Age of Onset  . Diabetes Mother   . Hypertension Mother   . Congestive Heart Failure Mother   . Hypertension Father   . Lung cancer Father   . Fibromyalgia Sister   . Lupus Sister   . Cancer Sister        of blood    Social History Social History  Substance Use Topics  . Smoking status: Never Smoker  .  Smokeless tobacco: Never Used  . Alcohol use No     Review of Systems  Constitutional: No fever/chills Eyes: No visual changes. No discharge ENT: No upper respiratory complaints. Cardiovascular: no chest pain. Respiratory: no cough. No SOB. Gastrointestinal: No abdominal pain.  No nausea, no vomiting.  No diarrhea.  No constipation. Musculoskeletal: Negative for musculoskeletal pain. Skin: Patient has right forehead laceration. Neurological: Negative for headaches, focal weakness or numbness.   ____________________________________________   PHYSICAL EXAM:  VITAL SIGNS: ED Triage Vitals  Enc Vitals Group     BP 02/15/17 1455 (!) 165/82     Pulse Rate 02/15/17 1455 61     Resp 02/15/17 1455 16     Temp 02/15/17 1455 98.1 F (36.7 C)     Temp Source 02/15/17 1455 Oral     SpO2 02/15/17 1455 99 %     Weight 02/15/17 1441 118 lb (53.5 kg)     Height 02/15/17 1441 5\' 1"  (1.549 m)     Head Circumference --      Peak Flow --      Pain Score 02/15/17 1440 7     Pain Loc --      Pain Edu? --      Excl. in [place]? --      Constitutional: Alert and oriented. Well appearing and in no acute distress. Eyes: Conjunctivae are normal. PERRL. EOMI. Head: Atraumatic. Cardiovascular: Normal rate, regular rhythm. Normal S1 and S2.  Good peripheral circulation. Respiratory: Normal respiratory effort without tachypnea or retractions. Lungs CTAB. Good air entry to the bases with no decreased or absent breath sounds. Musculoskeletal: Full range of motion to all extremities. No gross deformities appreciated. Neurologic:  Normal speech and language. No gross focal neurologic deficits are appreciated.  Skin: Patient has a 1 cm x 1 cm right-sided forehead laceration. Psychiatric: Mood and affect are normal. Speech and behavior are normal. Patient exhibits appropriate insight and judgement.   ____________________________________________   LABS (all labs ordered are listed, but only abnormal  results are displayed)  Labs Reviewed - No data to display ____________________________________________  EKG   ____________________________________________  RADIOLOGY Unk Pinto, personally viewed and evaluated these images (plain radiographs) as part of my medical decision making, as well as reviewing the written report by the radiologist.  Ct Head Wo Contrast  Result Date: 02/15/2017 CLINICAL DATA:  Patient was struck in the head with an object from the attic. Loss of consciousness. EXAM: CT HEAD WITHOUT CONTRAST TECHNIQUE: Contiguous axial images were obtained from the base of the skull through the vertex without intravenous contrast. COMPARISON:  None. FINDINGS: BRAIN: There is mild superficial atrophy with sulcal prominence. No intraparenchymal hemorrhage, mass effect nor midline shift. Periventricular and subcortical white matter hypodensities consistent with chronic small vessel ischemic disease are identified. No acute large vascular territory infarcts. No abnormal extra-axial fluid collections. Ossifications of the anterior falx. Basal cisterns are  not effaced and midline. VASCULAR: Moderate calcific atherosclerosis of the carotid siphons. SKULL: No skull fracture. No significant scalp soft tissue swelling. SINUSES/ORBITS: The mastoid air-cells are clear. The included paranasal sinuses are well-aerated.The included ocular globes and orbital contents are non-suspicious. OTHER: None. IMPRESSION: No acute intracranial abnormality. Mild superficial atrophy and small vessel ischemic disease of the periventricular white matter. Electronically Signed   By: Ashley Royalty M.D.   On: 02/15/2017 15:14    ____________________________________________    PROCEDURES  Procedure(s) performed:    Procedures  LACERATION REPAIR Performed by: Lannie Fields Authorized by: Lannie Fields Consent: Verbal consent obtained. Risks and benefits: risks, benefits and alternatives were  discussed Consent given by: patient Patient identity confirmed: provided demographic data Prepped and Draped in normal sterile fashion Wound explored and cleansed with Betadine  Laceration Location: Forehead  Laceration Length: 1cm x 1cm  No Foreign Bodies seen or palpated  Local anesthetic: LET  Anesthetic total: 3 ml  Irrigation method: syringe Amount of cleaning: standard  Skin closure: 5-0 Ethilon   Number of sutures: 3  Technique: Simple Interrupted   Patient tolerance: Patient tolerated the procedure well with no immediate complications.   Medications  lidocaine-EPINEPHrine-tetracaine (LET) solution (3 mLs Topical Given by Other 02/15/17 1649)     ____________________________________________   INITIAL IMPRESSION / ASSESSMENT AND PLAN / ED COURSE  Pertinent labs & imaging results that were available during my care of the patient were reviewed by me and considered in my medical decision making (see chart for details).  Review of the [place] CSRS was performed in accordance of the [place] prior to dispensing any controlled drugs.    Assessment and plan Head contusion Forehead laceration Patient presents to the emergency department after a head contusion. CT head was reassuring. Patient underwent laceration repair without complication. Neurologic exam and overall physical exam is reassuring. Patient was advised to have sutures removed by primary care in 5 days. Vital signs are reassuring at discharge. All patient questions were answered.   ____________________________________________  FINAL CLINICAL IMPRESSION(S) / ED DIAGNOSES  Final diagnoses:  Contusion of other part of head, initial encounter      NEW MEDICATIONS STARTED DURING THIS VISIT:  Discharge Medication List as of 02/15/2017  4:56 PM          This chart was dictated using voice recognition software/Dragon. Despite best efforts to proofread, errors can occur which can change the meaning. Any  change was purely unintentional.    Lannie Fields, PA-C 02/15/17 1853    Nena Polio, MD 02/16/17 [phone]

## 2017-02-20 ENCOUNTER — Ambulatory Visit (INDEPENDENT_AMBULATORY_CARE_PROVIDER_SITE_OTHER): Payer: BLUE CROSS/BLUE SHIELD | Admitting: Family Medicine

## 2017-02-20 ENCOUNTER — Encounter: Payer: Self-pay | Admitting: Family Medicine

## 2017-02-20 VITALS — BP 182/92 | HR 62 | Temp 97.8°F | Wt 121.0 lb

## 2017-02-20 DIAGNOSIS — I10 Essential (primary) hypertension: Secondary | ICD-10-CM | POA: Diagnosis not present

## 2017-02-20 MED ORDER — LISINOPRIL 20 MG PO TABS: 20.0000 mg | ORAL_TABLET | Freq: Every day | ORAL | 3 refills | Status: DC

## 2017-02-20 NOTE — Progress Notes (Signed)
Jocelyn Harris  MRN: [id number] DOB: [birth year]  Subjective:  HPI  The patient is a 66 year old female who presents for suture removal.  Patient had opened up her attic door on 02/15/17 when something fell out and hit her on the head.  She states it was an old phonograph that had a metal bell.  She said the metal bell part hit her on the forehead.  She had gone to the walk in clinic that day but she was told they wanted her to have a CT scan first since she had blacked out.  She was sent to the ER and they closed the laceration with 3 sutures and obtained a CT.   At the time of the ER visit she was told that her blood pressure was very high, 165/82.  The patient states that since then she is nauseated, light headed, headaches and has blurred vision in the right eye.    Patient Active Problem List   Diagnosis Date Noted  . Pap smear abnormality of cervix/human papillomavirus (HPV) positive 06/10/2016  . Allergic rhinitis 01/27/2015  . Anxiety 01/27/2015  . Back pain, chronic 01/27/2015  . Clinical depression 01/27/2015  . Acid reflux 01/27/2015  . HLD (hyperlipidemia) 01/27/2015  . BP (high blood pressure) 01/27/2015  . Cannot sleep 01/27/2015  . 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 GERD (gastroesophageal reflux disease)   . Hypertension     Social History   Social History  . Marital status: Divorced    Spouse name: N/A  . Number of children: N/A  . Years of education: N/A   Occupational History  . Not on file.   Social History Main Topics  . Smoking status: Never Smoker  . Smokeless tobacco: Never Used  . Alcohol use No  . Drug use: No  . Sexual activity: Not Currently    Birth control/ protection: None   Other Topics Concern  . Not on file   Social History Narrative  . No narrative on file    Outpatient Encounter Prescriptions as of 02/20/2017  Medication Sig Note  . busPIRone (BUSPAR) 10 MG tablet Take 1 tablet (10  mg total) by mouth 3 (three) times daily.   . calcium gluconate 500 MG tablet Take by mouth. 01/27/2015: Received from: [facility]  . Cholecalciferol 1000 UNITS capsule Take by mouth. 01/27/2015: Received from: [facility]  . conjugated estrogens (PREMARIN) vaginal cream Place 1 Applicatorful vaginally daily.   . cyclobenzaprine (FLEXERIL) 10 MG tablet Take 1 tablet (10 mg total) by mouth 3 (three) times daily as needed for muscle spasms.   . fexofenadine (EQ ALLERGY RELIEF) 180 MG tablet Take 1 tablet (180 mg total) by mouth daily.   . fluticasone (FLONASE) 50 MCG/ACT nasal spray Place 2 sprays into both nostrils daily.   Marland Kitchen ibuprofen (ADVIL,MOTRIN) 800 MG tablet TAKE 1 TABLET BY MOUTH EVERY 8 HOURS AS NEEDED   . lisinopril (PRINIVIL,ZESTRIL) 10 MG tablet Take 1 tablet (10 mg total) by mouth daily.   . montelukast (SINGULAIR) 10 MG tablet TAKE 1 TABLET( 10 MG TOTAL) BY MOUTH AT BEDTIME   . MULTIPLE VITAMINS PO Take by mouth. 01/27/2015: Received from: [facility]  . omeprazole (PRILOSEC) 20 MG capsule TAKE ONE CAPSULE BY MOUTH EVERY MORNING   . ranitidine (ZANTAC) 150 MG tablet Take 1 tablet (150 mg total) by mouth 2 (two) times daily.   . traZODone (DESYREL) 150  MG tablet Take 1 tablet (150 mg total) by mouth at bedtime.   . vitamin E 100 UNIT capsule Take by mouth. 01/27/2015: Received from: [facility]  . [DISCONTINUED] azithromycin (ZITHROMAX) 250 MG tablet 2 PO day 1 then 1 daily    No facility-administered encounter medications on file as of 02/20/2017.     Allergies  Allergen Reactions  . Augmentin [Amoxicillin-Pot Clavulanate] Nausea Only    Patient reports that her reflux was bad when she took medication  . Doxycycline Nausea Only and Nausea And Vomiting    Review of Systems  Constitutional: Positive for malaise/fatigue.  Eyes: Positive for blurred vision and pain (right eye soreness). Negative for double vision.   Respiratory: Negative for cough, shortness of breath and wheezing.   Cardiovascular: Negative for chest pain and palpitations.  Skin: Negative.   Neurological: Positive for dizziness, weakness and headaches.  Endo/Heme/Allergies: Negative.   Psychiatric/Behavioral: Negative.     Objective:  BP (!) 182/92 (BP Location: Right Arm, Patient Position: Sitting, Cuff Size: Normal)   Pulse 62   Temp 97.8 F (36.6 C) (Oral)   Wt 121 lb (54.9 kg)   SpO2 98%   BMI 22.86 kg/m   Physical Exam  Constitutional: She is oriented to person, place, and time and well-developed, well-nourished, and in no distress.  HENT:  Head: Normocephalic and atraumatic.  Eyes: Conjunctivae are normal. No scleral icterus.  Neck: No thyromegaly present.  Cardiovascular: Normal rate, regular rhythm and normal heart sounds.   Pulmonary/Chest: Effort normal.  Abdominal: Soft.  Neurological: She is alert and oriented to person, place, and time. No cranial nerve deficit. She exhibits normal muscle tone. Gait normal. Coordination normal. GCS score is 15.  Skin: Skin is warm and dry.  Psychiatric: Mood, memory, affect and judgment normal.    Assessment and Plan :  Concussion Syndrome Follow clinically. Increase Lisinopril to 20 mg daily. Take 2 of the 10 mg pills you already have and then when you run out get the new prescription of 20 mg pills and take 1 daily.  Get over the counter Fish oil and take 1 daily for the concussion  Get over the counter Emetrol for nausea, and use as needed.  Follow up in 2 weeks.   Hypertension, unspecified type  - lisinopril (PRINIVIL,ZESTRIL) 20 MG tablet; Take 1 tablet (20 mg total) by mouth daily.  Dispense: 90 tablet; Refill: 3

## 2017-02-20 NOTE — Patient Instructions (Signed)
Increase Lisinopril to 20 mg daily. Take 2 of the 10 mg pills you already have and then when you run out get the new prescription of 20 mg pills and take 1 daily.  Get over the counter Fish oil and take 1 daily for the concussion  Get over the counter Emetrol for nausea, and use as needed.  Follow up in 2 weeks.

## 2017-02-21 ENCOUNTER — Other Ambulatory Visit: Payer: Self-pay | Admitting: Family Medicine

## 2017-02-21 DIAGNOSIS — I10 Essential (primary) hypertension: Secondary | ICD-10-CM

## 2017-02-21 NOTE — Telephone Encounter (Signed)
Pt uses [facility].  Can we resend the lisinopril 20 mg to [facility].  Thanks [employer]

## 2017-02-25 MED ORDER — LISINOPRIL 20 MG PO TABS: 20.0000 mg | ORAL_TABLET | Freq: Every day | ORAL | 3 refills | Status: DC

## 2017-03-11 ENCOUNTER — Ambulatory Visit (INDEPENDENT_AMBULATORY_CARE_PROVIDER_SITE_OTHER): Payer: BLUE CROSS/BLUE SHIELD | Admitting: Family Medicine

## 2017-03-11 ENCOUNTER — Encounter: Payer: Self-pay | Admitting: Family Medicine

## 2017-03-11 VITALS — BP 116/72 | HR 60 | Temp 97.9°F | Resp 14 | Wt 122.0 lb

## 2017-03-11 DIAGNOSIS — I10 Essential (primary) hypertension: Secondary | ICD-10-CM | POA: Diagnosis not present

## 2017-03-11 DIAGNOSIS — Z23 Encounter for immunization: Secondary | ICD-10-CM

## 2017-03-11 DIAGNOSIS — S060X0S Concussion without loss of consciousness, sequela: Secondary | ICD-10-CM

## 2017-03-11 DIAGNOSIS — T148XXA Other injury of unspecified body region, initial encounter: Secondary | ICD-10-CM | POA: Diagnosis not present

## 2017-03-11 NOTE — Progress Notes (Signed)
Patient: Jocelyn Harris Female    DOB: 04-30-1951   66 y.o.   MRN: [id number] Visit Date: 03/11/2017  Today's Provider: Wilhemena Durie, MD   Chief Complaint  Patient presents with  . Concussion  . Hypertension   Subjective:    HPI   Hypertension, follow-up:  BP Readings from Last 3 Encounters:  03/11/17 116/72  02/20/17 (!) 182/92  02/15/17 (!) 155/68    She was last seen for hypertension 1 months ago.  BP at that visit was 182/92. Management since that visit includes increased Lisinopril to 20 mg daily. She reports good compliance with treatment. She is not having side effects.   Patient denies chest pain, chest pressure/discomfort, claudication, dyspnea, exertional chest pressure/discomfort, fatigue, irregular heart beat, lower extremity edema, near-syncope, orthopnea, palpitations, paroxysmal nocturnal dyspnea, syncope and tachypnea.    Wt Readings from Last 3 Encounters:  03/11/17 122 lb (55.3 kg)  02/20/17 121 lb (54.9 kg)  02/15/17 118 lb (53.5 kg)   ------------------------------------------------------------------------ She is also follow up on her concussion. She reports that she is feeling much better, she still has a few headaches every now and then but much better. She does have a spot on her back she would like looked at. She reports that she a burning sensation just above her tail bone. She noticed it this past Friday. She can not see the area well and does not know if she has scratched her self.     Allergies  Allergen Reactions  . Augmentin [Amoxicillin-Pot Clavulanate] Nausea Only    Patient reports that her reflux was bad when she took medication  . Doxycycline Nausea Only and Nausea And Vomiting     Current Outpatient Prescriptions:  .  busPIRone (BUSPAR) 10 MG tablet, Take 1 tablet (10 mg total) by mouth 3 (three) times daily., Disp: 90 tablet, Rfl: 11 .  calcium gluconate 500 MG tablet, Take by mouth., Disp: , Rfl:  .   Cholecalciferol 1000 UNITS capsule, Take by mouth., Disp: , Rfl:  .  conjugated estrogens (PREMARIN) vaginal cream, Place 1 Applicatorful vaginally daily., Disp: 42.5 g, Rfl: 12 .  cyclobenzaprine (FLEXERIL) 10 MG tablet, Take 1 tablet (10 mg total) by mouth 3 (three) times daily as needed for muscle spasms., Disp: 30 tablet, Rfl: 5 .  fexofenadine (EQ ALLERGY RELIEF) 180 MG tablet, Take 1 tablet (180 mg total) by mouth daily., Disp: 30 tablet, Rfl: 11 .  fluticasone (FLONASE) 50 MCG/ACT nasal spray, Place 2 sprays into both nostrils daily., Disp: 16 g, Rfl: 12 .  ibuprofen (ADVIL,MOTRIN) 800 MG tablet, TAKE 1 TABLET BY MOUTH EVERY 8 HOURS AS NEEDED, Disp: 60 tablet, Rfl: 11 .  lisinopril (PRINIVIL,ZESTRIL) 20 MG tablet, Take 1 tablet (20 mg total) by mouth daily., Disp: 90 tablet, Rfl: 3 .  montelukast (SINGULAIR) 10 MG tablet, TAKE 1 TABLET( 10 MG TOTAL) BY MOUTH AT BEDTIME, Disp: 30 tablet, Rfl: 11 .  MULTIPLE VITAMINS PO, Take by mouth., Disp: , Rfl:  .  omeprazole (PRILOSEC) 20 MG capsule, TAKE ONE CAPSULE BY MOUTH EVERY MORNING, Disp: 30 capsule, Rfl: 11 .  ranitidine (ZANTAC) 150 MG tablet, Take 1 tablet (150 mg total) by mouth 2 (two) times daily., Disp: 60 tablet, Rfl: 11 .  traZODone (DESYREL) 150 MG tablet, Take 1 tablet (150 mg total) by mouth at bedtime., Disp: 30 tablet, Rfl: 11 .  vitamin E 100 UNIT capsule, Take by mouth., Disp: , Rfl:   Review of  Systems  Constitutional: Negative.   HENT: Negative.   Eyes: Negative.   Respiratory: Negative.   Cardiovascular: Negative.   Gastrointestinal: Negative.   Endocrine: Negative.   Genitourinary: Negative.   Musculoskeletal: Positive for back pain (burning sensation just above tail bone. ).  Skin: Negative.   Allergic/Immunologic: Negative.   Neurological: Negative.   Hematological: Negative.   Psychiatric/Behavioral: Negative.     Social History  Substance Use Topics  . Smoking status: Never Smoker  . Smokeless tobacco:  Never Used  . Alcohol use No   Objective:   BP 116/72 (BP Location: Left Arm, Patient Position: Sitting, Cuff Size: Normal)   Pulse 60   Temp 97.9 F (36.6 C) (Oral)   Resp 14   Wt 122 lb (55.3 kg)   BMI 23.05 kg/m  Vitals:   03/11/17 1049  BP: 116/72  Pulse: 60  Resp: 14  Temp: 97.9 F (36.6 C)  TempSrc: Oral  Weight: 122 lb (55.3 kg)     Physical Exam  Constitutional: She is oriented to person, place, and time. She appears well-developed and well-nourished.  HENT:  Head: Normocephalic and atraumatic.  Eyes: Pupils are equal, round, and reactive to light. Conjunctivae and EOM are normal.  Neck: Normal range of motion. Neck supple.  Cardiovascular: Normal rate, regular rhythm, normal heart sounds and intact distal pulses.   Pulmonary/Chest: Effort normal and breath sounds normal.  Abdominal: Soft.  Musculoskeletal: Normal range of motion.  Neurological: She is alert and oriented to person, place, and time. She has normal reflexes.  Skin: Skin is warm and dry.  Abrasion on buttock, more on the left   Psychiatric: She has a normal mood and affect. Her behavior is normal. Judgment and thought content normal.        Assessment & Plan:     1. Need for influenza vaccination  - Flu Vaccine QUAD 36+ mos IM  2. Hypertension, unspecified type Improved. continue current medications.   3. Concussion without loss of consciousness, sequela (HCC) Improved.   4. Abrasion     HPI, Exam, and A&P Transcribed under the direction and in the presence of Cezar Misiaszek L. Cranford Mon, MD  Electronically Signed: Katina Dung, CMA  I have done the exam and reviewed the above chart and it is accurate to the best of my knowledge. Development worker, community has been used in this note in any air is in the dictation or transcription are unintentional.  Wilhemena Durie, MD  [place].

## 2017-04-19 ENCOUNTER — Encounter: Payer: Self-pay | Admitting: Obstetrics and Gynecology

## 2017-05-13 ENCOUNTER — Ambulatory Visit: Payer: Self-pay | Admitting: Family Medicine

## 2017-05-31 ENCOUNTER — Ambulatory Visit: Payer: BLUE CROSS/BLUE SHIELD | Admitting: Obstetrics and Gynecology

## 2017-05-31 ENCOUNTER — Encounter: Payer: Self-pay | Admitting: Obstetrics and Gynecology

## 2017-05-31 VITALS — BP 128/70 | HR 67 | Ht 61.0 in | Wt 122.0 lb

## 2017-05-31 DIAGNOSIS — N952 Postmenopausal atrophic vaginitis: Secondary | ICD-10-CM

## 2017-05-31 DIAGNOSIS — R8789 Other abnormal findings in specimens from female genital organs: Secondary | ICD-10-CM | POA: Diagnosis not present

## 2017-05-31 DIAGNOSIS — N941 Unspecified dyspareunia: Secondary | ICD-10-CM | POA: Diagnosis not present

## 2017-05-31 DIAGNOSIS — R87618 Other abnormal cytological findings on specimens from cervix uteri: Secondary | ICD-10-CM

## 2017-05-31 NOTE — Progress Notes (Signed)
    GYNECOLOGY CLINIC PROGRESS NOTE Subjective:     Jocelyn Harris is a 66 y.o. G1P0 woman who comes in today for a  pap smear only. Her most recent annual exam was on 03/2016. Her most recent Pap smear was on 03/22/2016 and showed NILM but HR HPV+ (but no sub-typing). Previous abnormal Pap smears: no.   The following portions of the patient's history were reviewed and updated as appropriate: allergies, current medications, past family history, past medical history, past social history, past surgical history and problem list.  Review of Systems A comprehensive review of systems was negative except for: Genitourinary: positive for dyspareunia.  Notes that she has been using vaginal lubricants without much improvement. Was prescribed Premarin cream last year, but notes only partial compliance.  Also noting stinging of vagina with application, which has been a deterrant.    Objective:    BP 128/70 (BP Location: Left Arm, Patient Position: Sitting, Cuff Size: Normal)   Pulse 67   Ht 5\' 1"  (1.549 m)   Wt 122 lb (55.3 kg)   BMI 23.05 kg/m  General Appearance: alert, no acute distress.  Pelvic Exam: external genitalia normal, rectovaginal septum normal.  Vagina without discharge, atrophic.  Cervix normal appearing, no lesions and no motion tenderness.  Uterus mobile, nontender, normal shape and size.  Adnexae non-palpable, nontender bilaterally. Pap smear obtained.   Assessment:     History of HPV+ pap smear.    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  - Patient with dyspareunia, not compliant with Premarin cream due to irritation.  Discussed other treatment options, including vaginal tablet inserts (testosterone or estrogen), vaginal estrogen ring, or oral formulations such as Osphena.  Patient desires to try Intrarosa (testosterone inserts). Given 2 week sample supply.  Can prescribe if patient desires.  - F/u in 4 weeks.     Rubie Maid, MD [facility]

## 2017-06-04 LAB — IGP, COBASHPV16/18
HPV 16: NEGATIVE
HPV 18: NEGATIVE
HPV other hr types: NEGATIVE
PAP Smear Comment: 0

## 2017-06-06 ENCOUNTER — Encounter: Payer: BLUE CROSS/BLUE SHIELD | Admitting: Obstetrics and Gynecology

## 2017-06-13 ENCOUNTER — Telehealth: Payer: Self-pay | Admitting: *Deleted

## 2017-06-13 NOTE — Telephone Encounter (Signed)
Patient called and states she is returning a call in reference to her lab results. Patient is requesting a call back. Her call back number is [phone]. Please advise. Thank you

## 2017-06-13 NOTE — Telephone Encounter (Signed)
Spoke with pt and informed her of lab results per Dr. Marcelline Mates. Pt understood and had no additional questions at this time. Nothing further is needed  Notes recorded by Rubie Maid, MD on 06/10/2017 at 12:01 PM EST Please inform patient that her pap smear was negative. She can continue routine screening.

## 2017-06-25 ENCOUNTER — Other Ambulatory Visit: Payer: Self-pay | Admitting: Family Medicine

## 2017-06-25 DIAGNOSIS — K219 Gastro-esophageal reflux disease without esophagitis: Secondary | ICD-10-CM

## 2017-06-27 ENCOUNTER — Other Ambulatory Visit: Payer: Self-pay | Admitting: Family Medicine

## 2017-07-02 ENCOUNTER — Ambulatory Visit (INDEPENDENT_AMBULATORY_CARE_PROVIDER_SITE_OTHER): Payer: BLUE CROSS/BLUE SHIELD | Admitting: Obstetrics and Gynecology

## 2017-07-02 ENCOUNTER — Encounter: Payer: Self-pay | Admitting: Obstetrics and Gynecology

## 2017-07-02 VITALS — BP 123/69 | HR 66 | Ht 61.0 in | Wt 122.8 lb

## 2017-07-02 DIAGNOSIS — N952 Postmenopausal atrophic vaginitis: Secondary | ICD-10-CM

## 2017-07-02 DIAGNOSIS — N941 Unspecified dyspareunia: Secondary | ICD-10-CM | POA: Diagnosis not present

## 2017-07-02 MED ORDER — PRASTERONE 6.5 MG VA INST: 1.0000 | VAGINAL_INSERT | Freq: Every day | VAGINAL | 11 refills | Status: DC

## 2017-07-02 NOTE — Progress Notes (Signed)
    GYNECOLOGY PROGRESS NOTE  Subjective:    Patient ID: Jocelyn Harris, female    DOB: 1951-03-19, 67 y.o.   MRN: [id number]  HPI  Patient is a 67 y.o. G1P0 female who presents for 4 week f/u of vaginal atrophy and dyspareunia. She was given samples of Intrarosa last visit which she notes appeared to be working some. She ran out of the sample (2 week supply) but did not call back for a prescription. Would like to continue if possible.   The following portions of the patient's history were reviewed and updated as appropriate: allergies, current medications, past family history, past medical history, past social history, past surgical history and problem list.  Review of Systems Pertinent items noted in HPI and remainder of comprehensive ROS otherwise negative.   Objective:   Blood pressure 123/69, pulse 66, height 5\' 1"  (1.549 m), weight 122 lb 12.8 oz (55.7 kg). General appearance: alert and no distress Pelvic: external genitalia normal, rectovaginal septum normal.  Vagina without discharge, atrophic. Cervix normal appearing, no lesions and no motion tenderness. Bimanual exam not performed.   Assessment:   Vaginal atrophy Dyspareunia  Plan:   Given another sample and prescription for Intrarosa sent to pharmacy.  Patient will f/u in 6 weeks for reassessment.     Rubie Maid, MD [facility]

## 2017-07-09 ENCOUNTER — Other Ambulatory Visit: Payer: Self-pay

## 2017-07-09 MED ORDER — ESTRADIOL 10 MCG VA TABS: 10.0000 ug | ORAL_TABLET | Freq: Every day | VAGINAL | 12 refills | Status: DC

## 2017-07-09 NOTE — Telephone Encounter (Signed)
Per [facility] she may try vagifem 10mg  daily. Pt aware med erx.

## 2017-08-13 ENCOUNTER — Encounter: Payer: Self-pay | Admitting: Obstetrics and Gynecology

## 2017-08-13 ENCOUNTER — Ambulatory Visit: Payer: BLUE CROSS/BLUE SHIELD | Admitting: Obstetrics and Gynecology

## 2017-08-13 VITALS — BP 113/69 | HR 69 | Ht 61.0 in | Wt 123.3 lb

## 2017-08-13 DIAGNOSIS — N952 Postmenopausal atrophic vaginitis: Secondary | ICD-10-CM | POA: Diagnosis not present

## 2017-08-13 DIAGNOSIS — R6882 Decreased libido: Secondary | ICD-10-CM

## 2017-08-13 NOTE — Progress Notes (Signed)
Pt is still having discomfort with sex.

## 2017-08-13 NOTE — Progress Notes (Signed)
    GYNECOLOGY PROGRESS NOTE  Subjective:    Patient ID: Jocelyn Harris, female    DOB: 02/12/1951, 67 y.o.   MRN: [id number]  HPI  Patient is a 67 y.o. G1P0 female who presents for 6 week f/u of vaginal atrophy.  She is currently taking Imvexxy 10 mg tablets.  Patient notes that she does not feel that it is working as well as the Curator ( was using samples, however insurance denied covering prescription).  Still also noting discomfort with sex. However after further discussion, patient notes that she has not been using the Imvexxy vaginal tablets as prescribed until 2 weeks ago.  She has only been taking the medication daily consistently for the past 2 weeks.  Patient also c/o decreased libido.    The following portions of the patient's history were reviewed and updated as appropriate: allergies, current medications, past family history, past medical history, past social history, past surgical history and problem list.  Review of Systems Pertinent items noted in HPI and remainder of comprehensive ROS otherwise negative.   Objective:   Blood pressure 113/69, pulse 69, height 5\' 1"  (1.549 m), weight 123 lb 4.8 oz (55.9 kg). General appearance: alert and no distress Abdomen: soft, non-tender; bowel sounds normal; no masses,  no organomegaly Pelvic: external genitalia normal, rectovaginal septum normal.  Vagina without discharge, mild atrophy present (however slightly improved from last visit).  Cervix normal appearing, no lesions and no motion tenderness.  Uterus mobile, nontender, normal shape and size.  Adnexae non-palpable, nontender bilaterally.  Extremities: extremities normal, atraumatic, no cyanosis or edema Neurologic: Grossly normal   Assessment:   Vaginal atrophy Decreased libido  Plan:   - Vaginal atrophy. Patient advised to continue use of Imvexxy vaginal tablets daily. Advised that she would usually see some relief after 4 weeks of use.  Will also give samples of  Premarin cream to use until effects of the Imvexxy tablets are more noticeable.  She can use the cream 2-3 times weekly.   - Decreased libido. Discussed ensuring that patient is maintaining her health (healthy diet, exercise) to increase release of natural endorphins and boost mood and energy, as patient notes that she usually just feels tired and does not have the motivation for sex. Also discussed natural remedies, given handout.  If no improvement, advised that she could attempt use of testosterone injections.  In addition, discussed change of scenery (romantic getaway), erotica, toys or masturbation. - F/u again in 6 week.  Rubie Maid, MD [facility]

## 2017-09-10 ENCOUNTER — Ambulatory Visit (INDEPENDENT_AMBULATORY_CARE_PROVIDER_SITE_OTHER): Payer: BLUE CROSS/BLUE SHIELD | Admitting: Family Medicine

## 2017-09-10 ENCOUNTER — Encounter: Payer: Self-pay | Admitting: Family Medicine

## 2017-09-10 VITALS — BP 142/70 | HR 60 | Temp 98.7°F | Resp 16 | Ht 61.0 in | Wt 121.0 lb

## 2017-09-10 DIAGNOSIS — Z1159 Encounter for screening for other viral diseases: Secondary | ICD-10-CM

## 2017-09-10 DIAGNOSIS — G4709 Other insomnia: Secondary | ICD-10-CM

## 2017-09-10 DIAGNOSIS — F32A Depression, unspecified: Secondary | ICD-10-CM

## 2017-09-10 DIAGNOSIS — F329 Major depressive disorder, single episode, unspecified: Secondary | ICD-10-CM | POA: Diagnosis not present

## 2017-09-10 DIAGNOSIS — E785 Hyperlipidemia, unspecified: Secondary | ICD-10-CM | POA: Diagnosis not present

## 2017-09-10 DIAGNOSIS — Z1231 Encounter for screening mammogram for malignant neoplasm of breast: Secondary | ICD-10-CM | POA: Diagnosis not present

## 2017-09-10 DIAGNOSIS — Z114 Encounter for screening for human immunodeficiency virus [HIV]: Secondary | ICD-10-CM

## 2017-09-10 DIAGNOSIS — I10 Essential (primary) hypertension: Secondary | ICD-10-CM | POA: Diagnosis not present

## 2017-09-10 DIAGNOSIS — Z1239 Encounter for other screening for malignant neoplasm of breast: Secondary | ICD-10-CM

## 2017-09-10 DIAGNOSIS — J309 Allergic rhinitis, unspecified: Secondary | ICD-10-CM | POA: Diagnosis not present

## 2017-09-10 DIAGNOSIS — F419 Anxiety disorder, unspecified: Secondary | ICD-10-CM | POA: Diagnosis not present

## 2017-09-10 DIAGNOSIS — K648 Other hemorrhoids: Secondary | ICD-10-CM | POA: Diagnosis not present

## 2017-09-10 DIAGNOSIS — Z0000 Encounter for general adult medical examination without abnormal findings: Secondary | ICD-10-CM | POA: Diagnosis not present

## 2017-09-10 DIAGNOSIS — Z1283 Encounter for screening for malignant neoplasm of skin: Secondary | ICD-10-CM

## 2017-09-10 MED ORDER — HYDROCORTISONE ACETATE 25 MG RE SUPP: 25.0000 mg | Freq: Two times a day (BID) | RECTAL | 0 refills | Status: DC

## 2017-09-10 MED ORDER — MONTELUKAST SODIUM 10 MG PO TABS: ORAL_TABLET | ORAL | 11 refills | Status: DC

## 2017-09-10 MED ORDER — LISINOPRIL 20 MG PO TABS: 20.0000 mg | ORAL_TABLET | Freq: Every day | ORAL | 3 refills | Status: DC

## 2017-09-10 MED ORDER — TRAZODONE HCL 150 MG PO TABS: 150.0000 mg | ORAL_TABLET | Freq: Every day | ORAL | 11 refills | Status: DC

## 2017-09-10 NOTE — Progress Notes (Signed)
Patient: Jocelyn Harris, Female    DOB: May 22, 1951, 67 y.o.   MRN: [id number] Visit Date: 09/10/2017  Today's Provider: Wilhemena Durie, MD   Chief Complaint  Patient presents with  . Annual Exam  . Headache   Subjective:    Annual physical exam Jocelyn Harris is a 67 y.o. female who presents today for health maintenance and complete physical. She feels well. She reports exercising none. She reports she is sleeping fairly well. 03/22/16 CPE 05/31/17 Pap-normal; HPV-negative Dr. Marcelline Mates GYN 02/04/14 Colonoscopy-normal recheck in 10 yrs -----------------------------------------------------------------  Patient C/O or daily headaches in the last 2 weeks. Patient reports she has been under more stress and has not been able to sleep well during the night due to leg pain. Patient reports being more tired and cold all day and night. Patient reports she taking Ibuprofen daily and reports moderate symptom control. Patient denies any visual changes, nausea or vomiting.   Patient C/O possible hemorrhoids. Patient reports she does have some blood on toilet paper.   Review of Systems  Constitutional: Negative.   HENT: Positive for sinus pain.   Eyes: Positive for itching.  Respiratory: Positive for cough.   Cardiovascular: Negative.   Gastrointestinal: Negative.   Endocrine: Positive for cold intolerance.  Genitourinary: Positive for vaginal pain.  Musculoskeletal: Positive for arthralgias and neck pain.  Skin: Negative.   Allergic/Immunologic: Positive for environmental allergies.  Neurological: Positive for headaches.  Hematological: Negative.   Psychiatric/Behavioral: The patient is nervous/anxious.     Social History      She  reports that she has never smoked. She has never used smokeless tobacco. She reports that she does not drink alcohol or use drugs.       Social History   Socioeconomic History  . Marital status: Divorced    Spouse name: Not on file  .  Number of children: Not on file  . Years of education: Not on file  . Highest education level: Not on file  Occupational History  . Not on file  Social Needs  . Financial resource strain: Not on file  . Food insecurity:    Worry: Not on file    Inability: Not on file  . Transportation needs:    Medical: Not on file    Non-medical: Not on file  Tobacco Use  . Smoking status: Never Smoker  . Smokeless tobacco: Never Used  Substance and Sexual Activity  . Alcohol use: No    Alcohol/week: 0.0 oz  . Drug use: No  . Sexual activity: Not Currently    Birth control/protection: None  Lifestyle  . Physical activity:    Days per week: Not on file    Minutes per session: Not on file  . Stress: Not on file  Relationships  . Social connections:    Talks on phone: Not on file    Gets together: Not on file    Attends religious service: Not on file    Active member of club or organization: Not on file    Attends meetings of clubs or organizations: Not on file    Relationship status: Not on file  Other Topics Concern  . Not on file  Social History Narrative  . Not on file    Past Medical History:  Diagnosis Date  . GERD (gastroesophageal reflux disease)   . Hypertension      Patient Active Problem List   Diagnosis Date Noted  . Pap smear abnormality  of cervix/human papillomavirus (HPV) positive 06/10/2016  . Allergic rhinitis 01/27/2015  . Anxiety 01/27/2015  . Back pain, chronic 01/27/2015  . Clinical depression 01/27/2015  . Acid reflux 01/27/2015  . HLD (hyperlipidemia) 01/27/2015  . BP (high blood pressure) 01/27/2015  . Cannot sleep 01/27/2015  . 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 KNEE SURGERY Right   . TOE SURGERY    . TUBAL LIGATION      Family History        Family Status  Relation Name Status  . Mother  Deceased  . Father  Deceased  . Sister  Deceased at age 47       from carbon monoxide poisoning  from home furnace  . Sister  Alive  . Sister  Alive  . Daughter  Alive        Her family history includes Cancer in her sister; Congestive Heart Failure in her mother; Diabetes in her mother; Fibromyalgia in her sister; Healthy in her daughter; Hypertension in her father and mother; Lung cancer in her father; Lupus in her sister.      Allergies  Allergen Reactions  . Augmentin [Amoxicillin-Pot Clavulanate] Nausea Only    Patient reports that her reflux was bad when she took medication  . Doxycycline Nausea Only and Nausea And Vomiting     Current Outpatient Medications:  .  busPIRone (BUSPAR) 10 MG tablet, Take 1 tablet (10 mg total) by mouth 3 (three) times daily., Disp: 90 tablet, Rfl: 11 .  calcium gluconate 500 MG tablet, Take by mouth., Disp: , Rfl:  .  Cholecalciferol 1000 UNITS capsule, Take by mouth., Disp: , Rfl:  .  Estradiol 10 MCG TABS vaginal tablet, Place 1 tablet (10 mcg total) vaginally daily., Disp: 30 tablet, Rfl: 12 .  fexofenadine (ALLEGRA) 180 MG tablet, TAKE 1 TABLET BY MOUTH EVERY DAY, Disp: 30 tablet, Rfl: 12 .  fluticasone (FLONASE) 50 MCG/ACT nasal spray, Place 2 sprays into both nostrils daily., Disp: 16 g, Rfl: 12 .  ibuprofen (ADVIL,MOTRIN) 800 MG tablet, TAKE 1 TABLET BY MOUTH EVERY 8 HOURS AS NEEDED, Disp: 60 tablet, Rfl: 11 .  lisinopril (PRINIVIL,ZESTRIL) 20 MG tablet, Take 1 tablet (20 mg total) by mouth daily., Disp: 90 tablet, Rfl: 3 .  montelukast (SINGULAIR) 10 MG tablet, TAKE 1 TABLET( 10 MG TOTAL) BY MOUTH AT BEDTIME, Disp: 30 tablet, Rfl: 11 .  MULTIPLE VITAMINS PO, Take by mouth., Disp: , Rfl:  .  omeprazole (PRILOSEC) 20 MG capsule, TAKE 1 CAPSULE BY MOUTH EVERY MORNING, Disp: 30 capsule, Rfl: 12 .  ranitidine (ZANTAC) 150 MG tablet, Take 1 tablet (150 mg total) by mouth 2 (two) times daily., Disp: 60 tablet, Rfl: 11 .  traZODone (DESYREL) 150 MG tablet, Take 1 tablet (150 mg total) by mouth at bedtime., Disp: 30 tablet, Rfl: 11 .  vitamin E  100 UNIT capsule, Take by mouth., Disp: , Rfl:    Patient Care Team: Jerrol Banana., MD as PCP - General (Family Medicine)      Objective:   Vitals: BP (!) 142/70 (BP Location: Left Arm, Patient Position: Sitting, Cuff Size: Normal)   Pulse 60   Temp 98.7 F (37.1 C) (Oral)   Resp 16   Ht 5\' 1"  (1.549 m)   Wt 121 lb (54.9 kg)   SpO2 99%   BMI 22.86 kg/m    Vitals:   09/10/17 1108  BP: (!) 142/70  Pulse: 60  Resp: 16  Temp: 98.7 F (37.1 C)  TempSrc: Oral  SpO2: 99%  Weight: 121 lb (54.9 kg)  Height: 5\' 1"  (1.549 m)     Physical Exam  Constitutional: She is oriented to person, place, and time. She appears well-developed and well-nourished.  HENT:  Head: Normocephalic and atraumatic.  Right Ear: External ear normal.  Left Ear: External ear normal.  Nose: Nose normal.  Mouth/Throat: Oropharynx is clear and moist.  Cerumen impaction left ear  Eyes: Pupils are equal, round, and reactive to light. Conjunctivae and EOM are normal.  Neck: Normal range of motion. Neck supple.  Cardiovascular: Normal rate, regular rhythm and normal heart sounds.  Pulmonary/Chest: Effort normal and breath sounds normal.  Abdominal: Soft. Bowel sounds are normal.  Genitourinary: Rectal exam shows internal hemorrhoid.  Musculoskeletal: Normal range of motion.  Neurological: She is alert and oriented to person, place, and time.  Skin: Skin is warm and dry.  Psychiatric: She has a normal mood and affect. Her behavior is normal. Judgment and thought content normal.     Depression Screen PHQ 2/9 Scores 09/10/2017 03/11/2017 03/22/2016  PHQ - 2 Score 0 0 0  PHQ- 9 Score 1 2 -      Assessment & Plan:Dictation #1 [id number]  [id number]      Routine Health Maintenance and Physical Exam  Exercise Activities and Dietary recommendations Goals    . Exercise 150 min/wk Moderate Activity       Immunization History  Administered Date(s) Administered  . Influenza Split  04/27/2006, 06/06/2011  . Influenza,inj,Quad PF,6+ Mos 03/11/2017  . Influenza-Unspecified 06/16/2015  . Tdap 12/23/2013  . Zoster 01/20/2014    Health Maintenance  Topic Date Due  . Hepatitis C Screening  09-28-1950  . HIV Screening  12/31/1967  . MAMMOGRAM  11/21/2003  . PAP SMEAR  05/31/2020  . TETANUS/TDAP  12/24/2023  . COLONOSCOPY  02/05/2024  . INFLUENZA VACCINE  Completed   1. Annual physical exam - TSH  2. Hypertension, unspecified type Blood pressure elevated today. Labs ordered follow up pending lab results. - CBC with Differential/Platelet - Comprehensive metabolic panel - lisinopril (PRINIVIL,ZESTRIL) 20 MG tablet; Take 1 tablet (20 mg total) by mouth daily.  Dispense: 90 tablet; Refill: 3  3. Anxiety Stable. Patient advised to continue current medication.  4. Depression, unspecified depression type Stable. Patient advised to continue current medication. - TSH - traZODone (DESYREL) 150 MG tablet; Take 1 tablet (150 mg total) by mouth at bedtime.  Dispense: 30 tablet; Refill: 11  5. Hyperlipidemia, unspecified hyperlipidemia type Stable. Labs ordered follow up pending results. - Lipid Panel With LDL/HDL Ratio  6. Other insomnia Stable. Labs ordered follow up pending results. - TSH  7. Need for hepatitis C screening test - Hepatitis C antibody  8. Screening for breast cancer Patient advised to call [facility] to schedule appointment. - MM Digital Diagnostic Bilat; Future  9. Allergic rhinitis - montelukast (SINGULAIR) 10 MG tablet; TAKE 1 TABLET( 10 MG TOTAL) BY MOUTH AT BEDTIME  Dispense: 30 tablet; Refill: 11  10. Screening for HIV (human immunodeficiency virus) - HIV antibody  11. Internal hemorrhoids Recurrent. Patient started on Anusol as below. - hydrocortisone (ANUSOL-HC) 25 MG suppository; Place 1 suppository (25 mg total) rectally 2 (two) times daily.  Dispense: 12 suppository; Refill: 0  12. Screening for skin cancer - Ambulatory referral  to Dermatology  --------------------------------------------------------------------  I have done the exam and reviewed the above chart and it is accurate to  the best of my knowledge. Development worker, community has been used in this note in any air is in the dictation or transcription are unintentional.   Wilhemena Durie, MD  [facility]

## 2017-09-10 NOTE — Patient Instructions (Addendum)
Please call the [facility] at [facility] to schedule this at [phone]  Start OTC Debrox ear drops. Earwax Buildup, Adult The ears produce a substance called earwax that helps keep bacteria out of the ear and protects the skin in the ear canal. Occasionally, earwax can build up in the ear and cause discomfort or hearing loss. What increases the risk? This condition is more likely to develop in people who:  Are female.  Are elderly.  Naturally produce more earwax.  Clean their ears often with cotton swabs.  Use earplugs often.  Use in-ear headphones often.  Wear hearing aids.  Have narrow ear canals.  Have earwax that is overly thick or sticky.  Have eczema.  Are dehydrated.  Have excess hair in the ear canal.  What are the signs or symptoms? Symptoms of this condition include:  Reduced or muffled hearing.  A feeling of fullness in the ear or feeling that the ear is plugged.  Fluid coming from the ear.  Ear pain.  Ear itch.  Ringing in the ear.  Coughing.  An obvious piece of earwax that can be seen inside the ear canal.  How is this diagnosed? This condition may be diagnosed based on:  Your symptoms.  Your medical history.  An ear exam. During the exam, your health care provider will look into your ear with an instrument called an otoscope.  You may have tests, including a hearing test. How is this treated? This condition may be treated by:  Using ear drops to soften the earwax.  Having the earwax removed by a health care provider. The health care provider may: ? Flush the ear with water. ? Use an instrument that has a loop on the end (curette). ? Use a suction device.  Surgery to remove the wax buildup. This may be done in severe cases.  Follow these instructions at home:  Take over-the-counter and prescription medicines only as told by your health care provider.  Do not put any objects, including  cotton swabs, into your ear. You can clean the opening of your ear canal with a washcloth or facial tissue.  Follow instructions from your health care provider about cleaning your ears. Do not over-clean your ears.  Drink enough fluid to keep your urine clear or pale yellow. This will help to thin the earwax.  Keep all follow-up visits as told by your health care provider. If earwax builds up in your ears often or if you use hearing aids, consider seeing your health care provider for routine, preventive ear cleanings. Ask your health care provider how often you should schedule your cleanings.  If you have hearing aids, clean them according to instructions from the manufacturer and your health care provider. Contact a health care provider if:  You have ear pain.  You develop a fever.  You have blood, pus, or other fluid coming from your ear.  You have hearing loss.  You have ringing in your ears that does not go away.  Your symptoms do not improve with treatment.  You feel like the room is spinning (vertigo). Summary  Earwax can build up in the ear and cause discomfort or hearing loss.  The most common symptoms of this condition include reduced or muffled hearing and a feeling of fullness in the ear or feeling that the ear is plugged.  This condition may be diagnosed based on your symptoms, your medical history, and an ear exam.  This condition may  be treated by using ear drops to soften the earwax or by having the earwax removed by a health care provider.  Do not put any objects, including cotton swabs, into your ear. You can clean the opening of your ear canal with a washcloth or facial tissue. This information is not intended to replace advice given to you by your health care provider. Make sure you discuss any questions you have with your health care provider. Document Released: 07/12/2004 Document Revised: 08/15/2016 Document Reviewed: 08/15/2016 Elsevier Interactive Patient  Education  [employer].

## 2017-09-13 ENCOUNTER — Telehealth: Payer: Self-pay | Admitting: Family Medicine

## 2017-09-13 DIAGNOSIS — Z1239 Encounter for other screening for malignant neoplasm of breast: Secondary | ICD-10-CM

## 2017-09-13 NOTE — Telephone Encounter (Signed)
Order was placed in Epic for diagnostic mammogram but diagnosis is for screening.Can you clarify if diagnostic mammogram is needed,Thanks

## 2017-09-16 NOTE — Telephone Encounter (Signed)
Yes looks like it should be just screening. I have put in new orders. Thanks.

## 2017-09-24 ENCOUNTER — Encounter: Payer: Self-pay | Admitting: Obstetrics and Gynecology

## 2017-09-25 ENCOUNTER — Ambulatory Visit: Payer: BLUE CROSS/BLUE SHIELD | Admitting: Obstetrics and Gynecology

## 2017-09-25 ENCOUNTER — Encounter: Payer: Self-pay | Admitting: Obstetrics and Gynecology

## 2017-09-25 VITALS — BP 155/79 | HR 54 | Ht 61.0 in | Wt 122.5 lb

## 2017-09-25 DIAGNOSIS — N941 Unspecified dyspareunia: Secondary | ICD-10-CM | POA: Diagnosis not present

## 2017-09-25 DIAGNOSIS — N952 Postmenopausal atrophic vaginitis: Secondary | ICD-10-CM | POA: Diagnosis not present

## 2017-09-25 DIAGNOSIS — Z803 Family history of malignant neoplasm of breast: Secondary | ICD-10-CM | POA: Diagnosis not present

## 2017-09-25 NOTE — Progress Notes (Signed)
    GYNECOLOGY PROGRESS NOTE  Subjective:    Patient ID: Jocelyn Harris, female    DOB: 16-Nov-1950, 67 y.o.   MRN: [id number]  HPI  Patient is a 67 y.o. G1P0 female who presents for 6 week f/u of vaginal atrophy.  She is currently using Imvexxy 10 mg vaginal tablets for treatment.  Notes that she has noticed some improvement, but still notices some mild occasional irritation and itching externally.  She and partner have not been intimate since initiation of medication, so cannot reassess symptoms of dsypareunia, on account of him recently being diagnosed with breast cancer and undergoing surgery and treatments.   The following portions of the patient's history were reviewed and updated as appropriate: allergies, current medications, past family history, past medical history, past social history, past surgical history and problem list.  Review of Systems Pertinent items noted in HPI and remainder of comprehensive ROS otherwise negative.   Objective:   Blood pressure (!) 155/79, pulse (!) 54, height 5\' 1"  (1.549 m), weight 122 lb 8 oz (55.6 kg). General appearance: alert and no distress Pelvic: external genitalia normal, rectovaginal septum normal.  Vagina with white pasty discharge (likely from dissolving tablet).  Vaginal atrophy mild, improved since prior visit. Cervix normal appearing, no lesions and no motion tenderness.  Bimanual exam not performed.   Assessment:   Vaginal atrophy Dyspareunia Family h/o breast cancer in female  Plan:   - Encouraged patient to continue use of Imvexxy as prescribed.  May take up to 12 weeks to note full effects of medication on her atrophy and dyspareunia.  Will f/u with patient again in 2- 3 months. Can use coconut oil/Vitamin E oil for external vulvar irritation as needed. No evidence of vulvar skin disorders at this time.  - Discussion had with patient regarding partner's newly diagnosed breast cancer.  Advised for patient to have him visit topic of  hereditary breast cancer screening at his next doctor's visit as he has children from a previous relationship.  Gave patient a handout for partner to review.    Rubie Maid, MD [facility]

## 2017-09-25 NOTE — Progress Notes (Signed)
Pt is having some itching and burning in the vaginal area.

## 2017-10-28 ENCOUNTER — Ambulatory Visit: Payer: BLUE CROSS/BLUE SHIELD | Admitting: Family Medicine

## 2017-10-28 ENCOUNTER — Encounter: Payer: Self-pay | Admitting: Family Medicine

## 2017-10-28 VITALS — BP 132/64 | HR 61 | Temp 98.6°F | Wt 119.6 lb

## 2017-10-28 DIAGNOSIS — R05 Cough: Secondary | ICD-10-CM

## 2017-10-28 DIAGNOSIS — R058 Other specified cough: Secondary | ICD-10-CM

## 2017-10-28 DIAGNOSIS — J3089 Other allergic rhinitis: Secondary | ICD-10-CM | POA: Diagnosis not present

## 2017-10-28 MED ORDER — PREDNISONE 5 MG PO TABS: 5.0000 mg | ORAL_TABLET | Freq: Every day | ORAL | 0 refills | Status: DC

## 2017-10-28 NOTE — Progress Notes (Signed)
Patient: Jocelyn Harris Female    DOB: 03-16-1951   67 y.o.   MRN: [id number] Visit Date: 10/28/2017  Today's Provider: Vernie Murders, PA   Chief Complaint  Patient presents with  . URI   Subjective:    URI   This is a new problem. Episode onset: 8 days ago. The problem has been gradually worsening. There has been no fever. Associated symptoms include congestion, coughing, ear pain, sneezing and a sore throat. Associated symptoms comments: Eyes watery. She has tried acetaminophen (Mucinex and OTC cough suppressant ) for the symptoms. The treatment provided mild relief.      Past Medical History:  Diagnosis Date  . GERD (gastroesophageal reflux disease)   . Hypertension    Past Surgical History:  Procedure Laterality Date  . KNEE SURGERY Right   . TOE SURGERY    . TUBAL LIGATION     Family History  Problem Relation Age of Onset  . Diabetes Mother   . Hypertension Mother   . Congestive Heart Failure Mother   . Hypertension Father   . Lung cancer Father   . Fibromyalgia Sister   . Lupus Sister   . Cancer Sister        of blood  . Healthy Daughter    Allergies  Allergen Reactions  . Augmentin [Amoxicillin-Pot Clavulanate] Nausea Only    Patient reports that her reflux was bad when she took medication  . Doxycycline Nausea Only and Nausea And Vomiting    Current Outpatient Medications:  .  busPIRone (BUSPAR) 10 MG tablet, Take 1 tablet (10 mg total) by mouth 3 (three) times daily., Disp: 90 tablet, Rfl: 11 .  calcium gluconate 500 MG tablet, Take by mouth., Disp: , Rfl:  .  Cholecalciferol 1000 UNITS capsule, Take by mouth., Disp: , Rfl:  .  Estradiol 10 MCG TABS vaginal tablet, Place 1 tablet (10 mcg total) vaginally daily., Disp: 30 tablet, Rfl: 12 .  fexofenadine (ALLEGRA) 180 MG tablet, TAKE 1 TABLET BY MOUTH EVERY DAY, Disp: 30 tablet, Rfl: 12 .  fluticasone (FLONASE) 50 MCG/ACT nasal spray, Place 2 sprays into both nostrils daily., Disp: 16 g, Rfl: 12 .   ibuprofen (ADVIL,MOTRIN) 800 MG tablet, TAKE 1 TABLET BY MOUTH EVERY 8 HOURS AS NEEDED, Disp: 60 tablet, Rfl: 11 .  lisinopril (PRINIVIL,ZESTRIL) 20 MG tablet, Take 1 tablet (20 mg total) by mouth daily., Disp: 90 tablet, Rfl: 3 .  montelukast (SINGULAIR) 10 MG tablet, TAKE 1 TABLET( 10 MG TOTAL) BY MOUTH AT BEDTIME, Disp: 30 tablet, Rfl: 11 .  MULTIPLE VITAMINS PO, Take by mouth., Disp: , Rfl:  .  omeprazole (PRILOSEC) 20 MG capsule, TAKE 1 CAPSULE BY MOUTH EVERY MORNING, Disp: 30 capsule, Rfl: 12 .  ranitidine (ZANTAC) 150 MG tablet, Take 1 tablet (150 mg total) by mouth 2 (two) times daily., Disp: 60 tablet, Rfl: 11 .  traZODone (DESYREL) 150 MG tablet, Take 1 tablet (150 mg total) by mouth at bedtime., Disp: 30 tablet, Rfl: 11 .  vitamin E 100 UNIT capsule, Take by mouth., Disp: , Rfl:  .  hydrocortisone (ANUSOL-HC) 25 MG suppository, Place 1 suppository (25 mg total) rectally 2 (two) times daily. (Patient not taking: Reported on 09/25/2017), Disp: 12 suppository, Rfl: 0  Review of Systems  Constitutional: Negative.   HENT: Positive for congestion, ear pain, sneezing and sore throat.   Eyes: Positive for discharge.  Respiratory: Positive for cough.   Cardiovascular: Negative.    Social History  Tobacco Use  . Smoking status: Never Smoker  . Smokeless tobacco: Never Used  Substance Use Topics  . Alcohol use: No    Alcohol/week: 0.0 oz   Objective:   BP 132/64 (BP Location: Right Arm, Patient Position: Sitting, Cuff Size: Normal)   Pulse 61   Temp 98.6 F (37 C) (Oral)   Wt 119 lb 9.6 oz (54.3 kg)   SpO2 98%   BMI 22.60 kg/m   Physical Exam  Constitutional: She is oriented to person, place, and time. She appears well-developed and well-nourished. No distress.  HENT:  Head: Normocephalic and atraumatic.  Right Ear: Hearing and external ear normal.  Left Ear: Hearing and external ear normal.  Nose: Nose normal.  Mouth/Throat: Oropharynx is clear and moist.  Good  transillumination of all sinuses without tenderness.  Eyes: Conjunctivae and lids are normal. Right eye exhibits no discharge. Left eye exhibits no discharge. No scleral icterus.  Cardiovascular: Normal rate.  Pulmonary/Chest: Effort normal and breath sounds normal. No respiratory distress.  Musculoskeletal: Normal range of motion.  Neurological: She is alert and oriented to person, place, and time.  Skin: Skin is intact. No lesion and no rash noted.  Psychiatric: She has a normal mood and affect. Her speech is normal and behavior is normal. Thought content normal.      Assessment & Plan:   1. Respiratory tract congestion with cough Onset over the past 8 days. No fever or sputum production. Some throat irritation with ticklish cough. May continue Muxinex-DM and will add prednisone taper for airway irritation with allergic rhinitis. Recheck prn. - predniSONE (DELTASONE) 5 MG tablet; Take 1 tablet (5 mg total) by mouth daily with breakfast. Taper down by one tablet daily (6,5,4,3,2,1)  Dispense: 21 tablet; Refill: 0  2. Seasonal allergic rhinitis due to other allergic trigger Some PND and nasal drainage with ticklish cough. May use Allegra 180 mg qd, Singulair 10 mg hs and add prednisone taper. Increase fluid intake and use a Saline Sinus Irrigation kit. Recheck prn. - predniSONE (DELTASONE) 5 MG tablet; Take 1 tablet (5 mg total) by mouth daily with breakfast. Taper down by one tablet daily (6,5,4,3,2,1)  Dispense: 21 tablet; Refill: 0     Dennis Chrismon, PA  [facility] 

## 2017-11-17 ENCOUNTER — Other Ambulatory Visit: Payer: Self-pay | Admitting: Family Medicine

## 2017-11-17 DIAGNOSIS — J3089 Other allergic rhinitis: Secondary | ICD-10-CM

## 2017-11-24 ENCOUNTER — Other Ambulatory Visit: Payer: Self-pay | Admitting: Family Medicine

## 2017-11-24 DIAGNOSIS — K219 Gastro-esophageal reflux disease without esophagitis: Secondary | ICD-10-CM

## 2017-11-29 ENCOUNTER — Other Ambulatory Visit: Payer: Self-pay | Admitting: Family Medicine

## 2017-11-29 DIAGNOSIS — F32A Depression, unspecified: Secondary | ICD-10-CM

## 2017-11-29 DIAGNOSIS — F329 Major depressive disorder, single episode, unspecified: Secondary | ICD-10-CM

## 2017-12-20 ENCOUNTER — Encounter: Payer: Self-pay | Admitting: Obstetrics and Gynecology

## 2017-12-20 ENCOUNTER — Ambulatory Visit: Payer: BLUE CROSS/BLUE SHIELD | Admitting: Obstetrics and Gynecology

## 2017-12-20 VITALS — BP 104/64 | HR 54 | Ht 61.0 in | Wt 120.2 lb

## 2017-12-20 DIAGNOSIS — N952 Postmenopausal atrophic vaginitis: Secondary | ICD-10-CM | POA: Diagnosis not present

## 2017-12-20 DIAGNOSIS — N941 Unspecified dyspareunia: Secondary | ICD-10-CM

## 2017-12-20 MED ORDER — LIDOCAINE HCL 2 % EX GEL: 1.0000 "application " | CUTANEOUS | 1 refills | Status: DC | PRN

## 2017-12-20 NOTE — Progress Notes (Signed)
Pt stated that she is having itching and burning. No other complaints.

## 2017-12-20 NOTE — Progress Notes (Signed)
    GYNECOLOGY PROGRESS NOTE  Subjective:    Patient ID: Jocelyn Harris, female    DOB: 1951/04/01, 67 y.o.   MRN: [id number]  HPI  Patient is a 67 y.o. G1P0 female who presents for for f/u of vaginal atrophy and dyspareunia.  She has been using Imvexxy for approximately 4 months.  She notes improvement in internal vaginal symptoms, however still notes near introitus an area on the left that still causes some itching and burning. Has not had intercourse with her partner lately as he is currently undergoing radiation treatments, however these will be completed in 1-2 weeks.   The following portions of the patient's history were reviewed and updated as appropriate: allergies, current medications, past family history, past medical history, past social history, past surgical history and problem list.  Review of Systems Pertinent items noted in HPI and remainder of comprehensive ROS otherwise negative.   Objective:   Blood pressure 104/64, pulse (!) 54, height 5\' 1"  (1.549 m), weight 120 lb 3.2 oz (54.5 kg). General appearance: alert and no distress Abdomen: soft, non-tender; bowel sounds normal; no masses,  no organomegaly Pelvic: external genitalia normal, rectovaginal septum normal.  Vagina without discharge. Mild atrophy present (improved since last visit). Left region introitus mildly tender to palpation but no lesions or erythema present.   Cervix normal appearing, no lesions and no motion tenderness.  Uterus mobile, nontender, normal shape and size.  Adnexae non-palpable, nontender bilaterally.    Assessment:   Vaginal atrophy Dypareunia  Plan:  - Continue using Imvexxy daily.  - Given samples of Premarin, to use dime-sized amount of Premarin cream at vaginal entry daily x 2 weeks, mix with over the counter hydrocortisone cream. Then discontinue use.  - Prescribe lidocaine gel for use at vaginal entry during intercourse if still having discomfort after use of Premarin cream trial. -  F/u in 6 months.  Rubie Maid, MD [facility]

## 2017-12-20 NOTE — Patient Instructions (Signed)
-   Continue using Imvexxy daily.  - Use dime-sized amount of Premarin cream at vaginal entry daily x 2 weeks, mix with over the counter hydrocortisone cream. Then discontinue - Use lidocaine gel at vaginal entry during intercourse if still having discomfort after use of Premarin cream trial.

## 2017-12-25 ENCOUNTER — Encounter: Payer: Self-pay | Admitting: Obstetrics and Gynecology

## 2017-12-27 ENCOUNTER — Other Ambulatory Visit: Payer: Self-pay | Admitting: Family Medicine

## 2018-01-08 ENCOUNTER — Ambulatory Visit
Admission: RE | Admit: 2018-01-08 | Discharge: 2018-01-08 | Disposition: A | Discharge status: Home / Self Care | Payer: BLUE CROSS/BLUE SHIELD | Source: Ambulatory Visit | Attending: Family Medicine | Admitting: Family Medicine

## 2018-01-08 DIAGNOSIS — Z1231 Encounter for screening mammogram for malignant neoplasm of breast: Secondary | ICD-10-CM | POA: Diagnosis present

## 2018-01-08 DIAGNOSIS — Z1239 Encounter for other screening for malignant neoplasm of breast: Secondary | ICD-10-CM

## 2018-01-31 ENCOUNTER — Other Ambulatory Visit: Payer: Self-pay | Admitting: Family Medicine

## 2018-01-31 DIAGNOSIS — I10 Essential (primary) hypertension: Secondary | ICD-10-CM

## 2018-02-03 ENCOUNTER — Telehealth: Payer: Self-pay | Admitting: Family Medicine

## 2018-02-03 MED ORDER — IBUPROFEN 800 MG PO TABS: 800.0000 mg | ORAL_TABLET | Freq: Two times a day (BID) | ORAL | 11 refills | Status: DC | PRN

## 2018-02-03 NOTE — Telephone Encounter (Signed)
Pt need refill on her ibuprofen 800 mg  She uses [place]  Thanks teri

## 2018-02-03 NOTE — Telephone Encounter (Signed)
BID prn,11rf,#[id number]

## 2018-02-03 NOTE — Telephone Encounter (Signed)
Medication was sent in   

## 2018-02-18 ENCOUNTER — Other Ambulatory Visit: Payer: Self-pay | Admitting: Family Medicine

## 2018-02-18 DIAGNOSIS — F329 Major depressive disorder, single episode, unspecified: Secondary | ICD-10-CM

## 2018-02-18 DIAGNOSIS — F32A Depression, unspecified: Secondary | ICD-10-CM

## 2018-03-17 ENCOUNTER — Ambulatory Visit (INDEPENDENT_AMBULATORY_CARE_PROVIDER_SITE_OTHER): Payer: Medicare HMO | Admitting: Family Medicine

## 2018-03-17 VITALS — BP 142/72 | HR 54 | Temp 98.0°F | Resp 98 | Wt 121.0 lb

## 2018-03-17 DIAGNOSIS — F419 Anxiety disorder, unspecified: Secondary | ICD-10-CM

## 2018-03-17 DIAGNOSIS — J3089 Other allergic rhinitis: Secondary | ICD-10-CM | POA: Diagnosis not present

## 2018-03-17 DIAGNOSIS — I10 Essential (primary) hypertension: Secondary | ICD-10-CM | POA: Diagnosis not present

## 2018-03-17 DIAGNOSIS — Z1283 Encounter for screening for malignant neoplasm of skin: Secondary | ICD-10-CM

## 2018-03-17 DIAGNOSIS — Z23 Encounter for immunization: Secondary | ICD-10-CM | POA: Diagnosis not present

## 2018-03-17 MED ORDER — LISINOPRIL 40 MG PO TABS: 40.0000 mg | ORAL_TABLET | Freq: Every day | ORAL | 3 refills | Status: DC

## 2018-03-17 MED ORDER — CETIRIZINE HCL 10 MG PO TABS: 10.0000 mg | ORAL_TABLET | Freq: Every day | ORAL | 11 refills | Status: DC

## 2018-03-17 NOTE — Progress Notes (Signed)
Jocelyn Harris  MRN: [id number] DOB: May 25, 1951  Subjective:  HPI  The patient is a 67 year old female who presents for 6 month follow up of chronic health.  She was last seen on 10/28/17 for an acute visit and had her annual physical on 09/10/17.    Hypertension-elevated on her physical visit.  Patient was instructed to increase her Lisinopril to 20 mg daily. Patient asked if this could make her more tired and when asked if she was having increased fatigue she said "maybe".  She also asked if it could cause her to have cough as she has been having a dry cough.  However, she also complains of her sinuses and reports that she has been getting sores in her nose.  Anxiety-stable on last visit and was instructed to continue medications.  Depression-stable on last visit and was instructed to continue medications.  She complains as always of sinus congestion and her left ear feels full.  She has not been back to see ENT.  Patient Active Problem List   Diagnosis Date Noted  . Pap smear abnormality of cervix/human papillomavirus (HPV) positive 06/10/2016  . Allergic rhinitis 01/27/2015  . Anxiety 01/27/2015  . Back pain, chronic 01/27/2015  . Clinical depression 01/27/2015  . Acid reflux 01/27/2015  . HLD (hyperlipidemia) 01/27/2015  . BP (high blood pressure) 01/27/2015  . Cannot sleep 01/27/2015  . 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 GERD (gastroesophageal reflux disease)   . Hypertension     Social History   Socioeconomic History  . Marital status: Divorced    Spouse name: Not on file  . Number of children: Not on file  . Years of education: Not on file  . Highest education level: Not on file  Occupational History  . Not on file  Social Needs  . Financial resource strain: Not on file  . Food insecurity:    Worry: Not on file    Inability: Not on file  . Transportation needs:    Medical: Not on file    Non-medical: Not on file    Tobacco Use  . Smoking status: Never Smoker  . Smokeless tobacco: Never Used  Substance and Sexual Activity  . Alcohol use: No    Alcohol/week: 0.0 standard drinks  . Drug use: No  . Sexual activity: Not Currently    Birth control/protection: None  Lifestyle  . Physical activity:    Days per week: Not on file    Minutes per session: Not on file  . Stress: Not on file  Relationships  . Social connections:    Talks on phone: Not on file    Gets together: Not on file    Attends religious service: Not on file    Active member of club or organization: Not on file    Attends meetings of clubs or organizations: Not on file    Relationship status: Not on file  . Intimate partner violence:    Fear of current or ex partner: Not on file    Emotionally abused: Not on file    Physically abused: Not on file    Forced sexual activity: Not on file  Other Topics Concern  . Not on file  Social History Narrative  . Not on file    Outpatient Encounter Medications as of 03/17/2018  Medication Sig  . busPIRone (BUSPAR) 10 MG tablet TAKE 1 TABLET BY MOUTH THREE TIMES DAILY  . calcium  gluconate 500 MG tablet Take by mouth.  . Cholecalciferol 1000 UNITS capsule Take by mouth.  . Estradiol 10 MCG TABS vaginal tablet Place 1 tablet (10 mcg total) vaginally daily.  . fexofenadine (ALLEGRA) 180 MG tablet TAKE 1 TABLET BY MOUTH EVERY DAY  . fluticasone (FLONASE) 50 MCG/ACT nasal spray USE 2 SPRAY IN EACH NOSTRIL DAILY  . ibuprofen (ADVIL,MOTRIN) 800 MG tablet Take 1 tablet (800 mg total) by mouth 2 (two) times daily as needed.  Marland Kitchen lisinopril (PRINIVIL,ZESTRIL) 20 MG tablet TAKE 1 TABLET BY MOUTH DAILY  . Lysine 500 MG TABS Take by mouth.  . montelukast (SINGULAIR) 10 MG tablet TAKE 1 TABLET( 10 MG TOTAL) BY MOUTH AT BEDTIME  . MULTIPLE VITAMINS PO Take by mouth.  Marland Kitchen omeprazole (PRILOSEC) 20 MG capsule TAKE 1 CAPSULE BY MOUTH EVERY MORNING  . ranitidine (ZANTAC) 150 MG tablet TAKE 1 TABLET BY MOUTH  TWICE DAILY  . traZODone (DESYREL) 150 MG tablet Take 1 tablet (150 mg total) by mouth at bedtime.  . vitamin E 100 UNIT capsule Take by mouth.  . [DISCONTINUED] hydrocortisone (ANUSOL-HC) 25 MG suppository Place 1 suppository (25 mg total) rectally 2 (two) times daily. (Patient not taking: Reported on 09/25/2017)  . [DISCONTINUED] lidocaine (XYLOCAINE) 2 % jelly Apply 1 application topically as needed.  . [DISCONTINUED] lisinopril (PRINIVIL,ZESTRIL) 20 MG tablet Take 1 tablet (20 mg total) by mouth daily.  . [DISCONTINUED] predniSONE (DELTASONE) 5 MG tablet Take 1 tablet (5 mg total) by mouth daily with breakfast. Taper down by one tablet daily (6,5,4,3,2,1) (Patient not taking: Reported on 12/20/2017)   No facility-administered encounter medications on file as of 03/17/2018.     Allergies  Allergen Reactions  . Augmentin [Amoxicillin-Pot Clavulanate] Nausea Only    Patient reports that her reflux was bad when she took medication  . Doxycycline Nausea Only and Nausea And Vomiting    Review of Systems  Constitutional: Positive for malaise/fatigue. Negative for fever.  HENT: Positive for congestion, ear pain and sinus pain. Negative for ear discharge, hearing loss, nosebleeds, sore throat and tinnitus.   Eyes: Positive for pain.  Respiratory: Positive for cough and stridor. Negative for sputum production, shortness of breath and wheezing.   Cardiovascular: Negative for chest pain, palpitations, orthopnea, claudication and leg swelling.  Gastrointestinal: Negative.   Neurological: Negative.   Endo/Heme/Allergies: Negative.   Psychiatric/Behavioral: Negative.     Objective:  BP (!) 142/72 (BP Location: Right Arm, Patient Position: Sitting, Cuff Size: Normal)   Pulse (!) 54   Temp 98 F (36.7 C) (Oral)   Resp (!) 98   Wt 121 lb (54.9 kg)   HC 16" (40.6 cm)   BMI 22.86 kg/m   Physical Exam  Constitutional: She is oriented to person, place, and time and well-developed, well-nourished,  and in no distress.  HENT:  Head: Normocephalic and atraumatic.  Right Ear: External ear normal.  Left Ear: External ear normal.  Mouth/Throat: Oropharynx is clear and moist.  Left EAC full of cerumen.  Eyes: Conjunctivae are normal. No scleral icterus.  Neck: No thyromegaly present.  Cardiovascular: Normal rate, regular rhythm and normal heart sounds.  Pulmonary/Chest: Effort normal and breath sounds normal.  Abdominal: Soft.  Musculoskeletal: She exhibits no edema.  Lymphadenopathy:    She has no cervical adenopathy.  Neurological: She is alert and oriented to person, place, and time. Gait normal. GCS score is 15.  Skin: Skin is warm and dry.  Dark tan.  Psychiatric: Mood, memory, affect and  judgment normal.    Assessment and Plan :  1. Need for influenza vaccination  - Flu vaccine HIGH DOSE PF (Fluzone High dose)  2. Hypertension, unspecified type  - Comprehensive metabolic panel - TSH - lisinopril (PRINIVIL,ZESTRIL) 40 MG tablet; Take 1 tablet (40 mg total) by mouth daily.  Dispense: 90 tablet; Refill: 3  3. Seasonal allergic rhinitis due to other allergic trigger May need referral back to ENT. - cetirizine (ZYRTEC) 10 MG tablet; Take 1 tablet (10 mg total) by mouth daily.  Dispense: 30 tablet; Refill: 11  4. Skin cancer screening - Ambulatory referral to Dermatology 5.Cerumen impaction Resolved. 6.  Chronic anxiety Patient might benefit from SSRI in the future. Stable presently.  I have done the exam and reviewed the chart and it is accurate to the best of my knowledge. Development worker, community has been used and  any errors in dictation or transcription are unintentional. Miguel Aschoff M.D. [facility]

## 2018-03-18 LAB — COMPREHENSIVE METABOLIC PANEL
A/G RATIO: 1.9 (ref 1.2–2.2)
ALBUMIN: 4.4 g/dL (ref 3.6–4.8)
ALT: 18 IU/L (ref 0–32)
AST: 15 IU/L (ref 0–40)
Alkaline Phosphatase: 59 IU/L (ref 39–117)
BILIRUBIN TOTAL: 0.4 mg/dL (ref 0.0–1.2)
BUN / CREAT RATIO: 29 — AB (ref 12–28)
BUN: 22 mg/dL (ref 8–27)
CHLORIDE: 104 mmol/L (ref 96–106)
CO2: 26 mmol/L (ref 20–29)
Calcium: 9.6 mg/dL (ref 8.7–10.3)
Creatinine, Ser: 0.77 mg/dL (ref 0.57–1.00)
GFR calc Af Amer: 94 mL/min/{1.73_m2} (ref >59)
GFR, EST NON AFRICAN AMERICAN: 81 mL/min/{1.73_m2} (ref >59)
GLOBULIN, TOTAL: 2.3 g/dL (ref 1.5–4.5)
Glucose: 92 mg/dL (ref 65–99)
POTASSIUM: 4.1 mmol/L (ref 3.5–5.2)
Sodium: 142 mmol/L (ref 134–144)
TOTAL PROTEIN: 6.7 g/dL (ref 6.0–8.5)

## 2018-03-18 LAB — TSH: TSH: 1.93 u[IU]/mL (ref 0.450–4.500)

## 2018-03-21 ENCOUNTER — Telehealth: Payer: Self-pay

## 2018-03-21 NOTE — Telephone Encounter (Signed)
[facility]  ED   ----- Message from Jerrol Banana., MD sent at 03/20/2018  3:59 PM EDT ----- Labs OK

## 2018-03-21 NOTE — Telephone Encounter (Signed)
-----   Message from Jerrol Banana., MD sent at 03/20/2018  3:59 PM EDT ----- Labs OK

## 2018-03-21 NOTE — Telephone Encounter (Signed)
Advised  ED 

## 2018-03-24 ENCOUNTER — Encounter: Payer: Self-pay | Admitting: Family Medicine

## 2018-04-18 ENCOUNTER — Other Ambulatory Visit: Payer: Self-pay | Admitting: Family Medicine

## 2018-04-18 DIAGNOSIS — F32A Depression, unspecified: Secondary | ICD-10-CM

## 2018-04-18 DIAGNOSIS — F329 Major depressive disorder, single episode, unspecified: Secondary | ICD-10-CM

## 2018-04-28 ENCOUNTER — Ambulatory Visit: Payer: Self-pay | Admitting: Family Medicine

## 2018-05-12 ENCOUNTER — Ambulatory Visit (INDEPENDENT_AMBULATORY_CARE_PROVIDER_SITE_OTHER): Payer: Medicare HMO | Admitting: Family Medicine

## 2018-05-12 VITALS — BP 152/90 | HR 60 | Temp 98.1°F | Resp 16 | Wt 123.0 lb

## 2018-05-12 DIAGNOSIS — J3089 Other allergic rhinitis: Secondary | ICD-10-CM | POA: Diagnosis not present

## 2018-05-12 DIAGNOSIS — I10 Essential (primary) hypertension: Secondary | ICD-10-CM

## 2018-05-12 DIAGNOSIS — R5382 Chronic fatigue, unspecified: Secondary | ICD-10-CM | POA: Diagnosis not present

## 2018-05-12 DIAGNOSIS — Z1159 Encounter for screening for other viral diseases: Secondary | ICD-10-CM

## 2018-05-12 DIAGNOSIS — F419 Anxiety disorder, unspecified: Secondary | ICD-10-CM

## 2018-05-12 NOTE — Progress Notes (Signed)
Jocelyn Harris  MRN: [id number] DOB: [birth year]  Subjective:  HPI   The patient is a 67 year old female who presents today for 2 month follow up of her hypertension.  She was last seen 03/17/18.  At that time  She was instructed to increase her Lisinopril to 40 mg daily.  She was also given prescription for Cetirizine for her allergic rhinitis.  The patient states that since the time of increasing her medicine she has only had her blood pressure checked once.  She states it was "ok" at that check.    BP Readings from Last 3 Encounters:  05/12/18 (!) 152/90  03/17/18 (!) 142/72  12/20/17 104/64   The patient does note that she has been having more headaches recently and is not sure if they are coming from her allergies or something else.  She reports that currently she feels them more in the frontal part of her head but she has also had headaches on the top of her head. The patient had not yet started the Cetirizine because she was going to finish her Fexofenadine first.  It is of note that she always has sinus issues and discomfort. Patient Active Problem List   Diagnosis Date Noted  . Pap smear abnormality of cervix/human papillomavirus (HPV) positive 06/10/2016  . Allergic rhinitis 01/27/2015  . Anxiety 01/27/2015  . Back pain, chronic 01/27/2015  . Clinical depression 01/27/2015  . Acid reflux 01/27/2015  . HLD (hyperlipidemia) 01/27/2015  . BP (high blood pressure) 01/27/2015  . Cannot sleep 01/27/2015  . 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 GERD (gastroesophageal reflux disease)   . Hypertension     Social History   Socioeconomic History  . Marital status: Divorced    Spouse name: Not on file  . Number of children: Not on file  . Years of education: Not on file  . Highest education level: Not on file  Occupational History  . Not on file  Social Needs  . Financial resource strain: Not on file  . Food insecurity:   Worry: Not on file    Inability: Not on file  . Transportation needs:    Medical: Not on file    Non-medical: Not on file  Tobacco Use  . Smoking status: Never Smoker  . Smokeless tobacco: Never Used  Substance and Sexual Activity  . Alcohol use: No    Alcohol/week: 0.0 standard drinks  . Drug use: No  . Sexual activity: Not Currently    Birth control/protection: None  Lifestyle  . Physical activity:    Days per week: Not on file    Minutes per session: Not on file  . Stress: Not on file  Relationships  . Social connections:    Talks on phone: Not on file    Gets together: Not on file    Attends religious service: Not on file    Active member of club or organization: Not on file    Attends meetings of clubs or organizations: Not on file    Relationship status: Not on file  . Intimate partner violence:    Fear of current or ex partner: Not on file    Emotionally abused: Not on file    Physically abused: Not on file    Forced sexual activity: Not on file  Other Topics Concern  . Not on file  Social History Narrative  . Not on file    Outpatient Encounter  Medications as of 05/12/2018  Medication Sig Note  . busPIRone (BUSPAR) 10 MG tablet TAKE 1 TABLET BY MOUTH THREE TIMES DAILY   . calcium gluconate 500 MG tablet Take by mouth.   . Estradiol 10 MCG TABS vaginal tablet Place 1 tablet (10 mcg total) vaginally daily.   . fexofenadine (ALLEGRA) 180 MG tablet TAKE 1 TABLET BY MOUTH EVERY DAY   . fluticasone (FLONASE) 50 MCG/ACT nasal spray USE 2 SPRAY IN EACH NOSTRIL DAILY   . ibuprofen (ADVIL,MOTRIN) 800 MG tablet Take 1 tablet (800 mg total) by mouth 2 (two) times daily as needed.   Marland Kitchen lisinopril (PRINIVIL,ZESTRIL) 40 MG tablet Take 1 tablet (40 mg total) by mouth daily.   . montelukast (SINGULAIR) 10 MG tablet TAKE 1 TABLET( 10 MG TOTAL) BY MOUTH AT BEDTIME   . MULTIPLE VITAMINS PO Take by mouth.   Marland Kitchen omeprazole (PRILOSEC) 20 MG capsule TAKE 1 CAPSULE BY MOUTH EVERY MORNING    . ranitidine (ZANTAC) 150 MG tablet TAKE 1 TABLET BY MOUTH TWICE DAILY   . traZODone (DESYREL) 150 MG tablet Take 1 tablet (150 mg total) by mouth at bedtime.   . cetirizine (ZYRTEC) 10 MG tablet Take 1 tablet (10 mg total) by mouth daily. (Patient not taking: Reported on 05/12/2018) 05/12/2018: Patient has not started this yet.   . Cholecalciferol 1000 UNITS capsule Take by mouth.   . Lysine 500 MG TABS Take by mouth.   . Omega-3 Fatty Acids (FISH OIL) 1000 MG CAPS Take by mouth.   . psyllium (REGULOID) 0.52 g capsule Take 0.52 g by mouth daily.   . vitamin E 100 UNIT capsule Take by mouth.    No facility-administered encounter medications on file as of 05/12/2018.     Allergies  Allergen Reactions  . Augmentin [Amoxicillin-Pot Clavulanate] Nausea Only    Patient reports that her reflux was bad when she took medication  . Doxycycline Nausea Only and Nausea And Vomiting    Review of Systems  Constitutional: Negative.   HENT: Positive for congestion, ear pain and sinus pain. Negative for sore throat and tinnitus.   Eyes: Negative.   Respiratory: Positive for cough. Negative for sputum production, shortness of breath and wheezing.   Cardiovascular: Negative for chest pain, palpitations, orthopnea, claudication and leg swelling.  Gastrointestinal: Negative.   Genitourinary: Negative.   Skin: Negative.   Neurological: Positive for headaches.  Endo/Heme/Allergies: Negative.   Psychiatric/Behavioral: Negative.     Objective:  BP (!) 152/90 (BP Location: Right Arm, Patient Position: Sitting, Cuff Size: Normal)   Pulse 60   Temp 98.1 F (36.7 C) (Oral)   Resp 16   Wt 123 lb (55.8 kg)   SpO2 97%   BMI 23.24 kg/m   Physical Exam  Constitutional: She is oriented to person, place, and time and well-developed, well-nourished, and in no distress.  HENT:  Head: Normocephalic and atraumatic.  Right Ear: External ear normal.  Left Ear: External ear normal.  Nose: Nose normal.    Eyes: Conjunctivae are normal.  Neck: No thyromegaly present.  Cardiovascular: Normal rate, regular rhythm and normal heart sounds.  Pulmonary/Chest: Effort normal and breath sounds normal.  Abdominal: Soft.  Musculoskeletal: She exhibits no edema.  Neurological: She is alert and oriented to person, place, and time. Gait normal. GCS score is 15.  Skin: Skin is warm and dry.  Psychiatric: Mood, memory, affect and judgment normal.    Assessment and Plan :   1. Hypertension, unspecified type Fair control.  We need to add on another medication. - CBC with Differential/Platelet  2. Encounter for hepatitis C screening test for low risk patient  - Hepatitis C antibody  3. Seasonal allergic rhinitis due to other allergic trigger Change Allegra to Zyrtec.  May need ENT referral.  No signs at all of infection of the sinuses  4. Anxiety Chronic issue which appears to be stable  5. Chronic fatigue She complains of cold intolerance.  Get CBC.   HPI, Exam and A&P Transcribed under the direction and in the presence of Miguel Aschoff, Brooke Bonito., MD. Electronically Signed: Althea Charon, RMA I have done the exam and reviewed the chart and it is accurate to the best of my knowledge. Development worker, community has been used and  any errors in dictation or transcription are unintentional. Miguel Aschoff M.D. [facility]

## 2018-05-13 DIAGNOSIS — R5383 Other fatigue: Secondary | ICD-10-CM | POA: Insufficient documentation

## 2018-05-13 LAB — CBC WITH DIFFERENTIAL/PLATELET
BASOS ABS: 0.1 10*3/uL (ref 0.0–0.2)
Basos: 1 %
EOS (ABSOLUTE): 0.1 10*3/uL (ref 0.0–0.4)
Eos: 1 %
HEMATOCRIT: 36.8 % (ref 34.0–46.6)
HEMOGLOBIN: 11.9 g/dL (ref 11.1–15.9)
Immature Grans (Abs): 0 10*3/uL (ref 0.0–0.1)
Immature Granulocytes: 0 %
LYMPHS ABS: 2.4 10*3/uL (ref 0.7–3.1)
Lymphs: 33 %
MCH: 28.7 pg (ref 26.6–33.0)
MCHC: 32.3 g/dL (ref 31.5–35.7)
MCV: 89 fL (ref 79–97)
MONOCYTES: 8 %
MONOS ABS: 0.6 10*3/uL (ref 0.1–0.9)
NEUTROS ABS: 4.1 10*3/uL (ref 1.4–7.0)
Neutrophils: 57 %
Platelets: 243 10*3/uL (ref 150–450)
RBC: 4.15 x10E6/uL (ref 3.77–5.28)
RDW: 12.9 % (ref 12.3–15.4)
WBC: 7.2 10*3/uL (ref 3.4–10.8)

## 2018-05-13 LAB — HEPATITIS C ANTIBODY

## 2018-06-24 ENCOUNTER — Encounter: Payer: Self-pay | Admitting: Obstetrics and Gynecology

## 2018-06-24 ENCOUNTER — Ambulatory Visit: Payer: Medicare HMO | Admitting: Obstetrics and Gynecology

## 2018-06-24 VITALS — BP 149/74 | HR 64 | Ht 61.0 in | Wt 122.2 lb

## 2018-06-24 DIAGNOSIS — N952 Postmenopausal atrophic vaginitis: Secondary | ICD-10-CM

## 2018-06-24 MED ORDER — ESTROGENS, CONJUGATED 0.625 MG/GM VA CREA: 1.0000 | TOPICAL_CREAM | VAGINAL | 12 refills | Status: DC

## 2018-06-24 NOTE — Progress Notes (Signed)
    GYNECOLOGY PROGRESS NOTE  Subjective:    Patient ID: Jocelyn Harris, female    DOB: March 07, 1951, 68 y.o.   MRN: [id number]  HPI  Patient is a 68 y.o. G1P0 female who presents for for 6 month f/u of vaginal atrophy and dyspareunia.  She has been using Imvexxy 10 mg for ~ 10 months.   She continues to note improvement in internal vaginal symptoms, however still notes near introitus an area on the left that still causes some itching and burning.   The following portions of the patient's history were reviewed and updated as appropriate: allergies, current medications, past family history, past medical history, past social history, past surgical history and problem list.  Review of Systems Pertinent items noted in HPI and remainder of comprehensive ROS otherwise negative.   Objective:   Blood pressure (!) 149/74, pulse 64, height 5\' 1"  (1.549 m), weight 122 lb 3.2 oz (55.4 kg). General appearance: alert and no distress Abdomen: soft, non-tender; bowel sounds normal; no masses,  no organomegaly Pelvic: external genitalia normal, rectovaginal septum normal.  Vagina without discharge. Vagina well estrogenized. Mild atrophy noted at introitus.  Cervix normal appearing, no lesions and no motion tenderness.  Uterus mobile, nontender, normal shape and size.  Adnexae non-palpable, nontender bilaterally.    Assessment:   Vaginal atrophy  Plan:  - Discussed continued use of Imvexxy daily with addition of coconut oil/vitamin oil to introitus, or changing to estrogen cream form for internal and external use. Patient desires to try vaginal cream. Will discontinue Imvexxy and prescribe Premarin cream.  To use cream 2-3 times daily.  - F/u in 3- 6 months to reassess symptoms.  Rubie Maid, MD [facility]

## 2018-06-24 NOTE — Progress Notes (Signed)
Pt stated that she is still having dryness and discomfort in the vaginal area.

## 2018-07-13 ENCOUNTER — Other Ambulatory Visit: Payer: Self-pay | Admitting: Family Medicine

## 2018-07-13 DIAGNOSIS — K219 Gastro-esophageal reflux disease without esophagitis: Secondary | ICD-10-CM

## 2018-08-06 ENCOUNTER — Encounter: Payer: Self-pay | Admitting: Family Medicine

## 2018-08-06 ENCOUNTER — Other Ambulatory Visit: Payer: Self-pay

## 2018-08-06 ENCOUNTER — Ambulatory Visit (INDEPENDENT_AMBULATORY_CARE_PROVIDER_SITE_OTHER): Payer: Medicare HMO | Admitting: Family Medicine

## 2018-08-06 VITALS — BP 140/82 | HR 58 | Temp 98.7°F | Ht 61.0 in | Wt 125.6 lb

## 2018-08-06 DIAGNOSIS — K219 Gastro-esophageal reflux disease without esophagitis: Secondary | ICD-10-CM

## 2018-08-06 DIAGNOSIS — I10 Essential (primary) hypertension: Secondary | ICD-10-CM | POA: Diagnosis not present

## 2018-08-06 DIAGNOSIS — F329 Major depressive disorder, single episode, unspecified: Secondary | ICD-10-CM | POA: Diagnosis not present

## 2018-08-06 DIAGNOSIS — F32A Depression, unspecified: Secondary | ICD-10-CM

## 2018-08-06 MED ORDER — TRAZODONE HCL 150 MG PO TABS: 150.0000 mg | ORAL_TABLET | Freq: Every day | ORAL | 11 refills | Status: DC

## 2018-08-06 NOTE — Progress Notes (Signed)
Patient: Jocelyn Harris Female    DOB: 18-May-1951   68 y.o.   MRN: [id number] Visit Date: 08/06/2018  Today's Provider: Wilhemena Durie, MD   Chief Complaint  Patient presents with  . Hypertension    3 month fup  . Gastroesophageal Reflux    pt has had some burning in throat and nausea after eating.  on occations she has heartburn some.  since December 2019   Subjective:     HPI   Hypertension, follow-up:  BP Readings from Last 3 Encounters:  08/06/18 140/82  06/24/18 (!) 149/74  05/12/18 (!) 152/90    She was last seen for hypertension 3 months ago.  BP at that visit was 152/90. Management since that visit includes .since last visit September 2019 was instructed to increase Lisinopril to 40 mg and pt reports she has been doing well with it. She reports good compliance with treatment. She is not having side effects.  She is not exercising. She is adherent to low salt diet.   Outside blood pressures are 150/90. She is experiencing fatigue and at times she can get short of breath.  Patient denies none.   Cardiovascular risk factors include none.  Use of agents associated with hypertension: none.     Weight trend: stable Wt Readings from Last 3 Encounters:  08/06/18 125 lb 9.6 oz (57 kg)  06/24/18 122 lb 3.2 oz (55.4 kg)  05/12/18 123 lb (55.8 kg)    Current diet: in general, a "healthy" diet    ------------------------------------------------------------------------   Allergies  Allergen Reactions  . Augmentin [Amoxicillin-Pot Clavulanate] Nausea Only    Patient reports that her reflux was bad when she took medication  . Doxycycline Nausea Only and Nausea And Vomiting     Current Outpatient Medications:  .  busPIRone (BUSPAR) 10 MG tablet, TAKE 1 TABLET BY MOUTH THREE TIMES DAILY, Disp: 90 tablet, Rfl: 11 .  calcium gluconate 500 MG tablet, Take by mouth., Disp: , Rfl:  .  Cholecalciferol 1000 UNITS capsule, Take by mouth., Disp: , Rfl:  .   fluticasone (FLONASE) 50 MCG/ACT nasal spray, USE 2 SPRAY IN EACH NOSTRIL DAILY, Disp: 16 g, Rfl: 12 .  ibuprofen (ADVIL,MOTRIN) 800 MG tablet, Take 1 tablet (800 mg total) by mouth 2 (two) times daily as needed., Disp: 60 tablet, Rfl: 11 .  lisinopril (PRINIVIL,ZESTRIL) 40 MG tablet, Take 1 tablet (40 mg total) by mouth daily., Disp: 90 tablet, Rfl: 3 .  Lysine 500 MG TABS, Take by mouth., Disp: , Rfl:  .  montelukast (SINGULAIR) 10 MG tablet, TAKE 1 TABLET( 10 MG TOTAL) BY MOUTH AT BEDTIME, Disp: 30 tablet, Rfl: 11 .  MULTIPLE VITAMINS PO, Take by mouth., Disp: , Rfl:  .  Omega-3 Fatty Acids (FISH OIL) 1000 MG CAPS, Take by mouth., Disp: , Rfl:  .  omeprazole (PRILOSEC) 20 MG capsule, TAKE ONE CAPSULE BY MOUTH EVERY MORNING, Disp: 30 capsule, Rfl: 12 .  ranitidine (ZANTAC) 150 MG tablet, TAKE 1 TABLET BY MOUTH TWICE DAILY, Disp: 60 tablet, Rfl: 11 .  traZODone (DESYREL) 150 MG tablet, Take 1 tablet (150 mg total) by mouth at bedtime., Disp: 30 tablet, Rfl: 11 .  vitamin E 100 UNIT capsule, Take by mouth., Disp: , Rfl:  .  cetirizine (ZYRTEC) 10 MG tablet, Take 1 tablet (10 mg total) by mouth daily., Disp: 30 tablet, Rfl: 11 .  conjugated estrogens (PREMARIN) vaginal cream, Place 1 Applicatorful vaginally 3 (three) times  a week. Use 0.5 mg vaginally 2-3 times weekly (Patient not taking: Reported on 08/06/2018), Disp: 42.5 g, Rfl: 12 .  fexofenadine (ALLEGRA) 180 MG tablet, TAKE 1 TABLET BY MOUTH EVERY DAY (Patient not taking: Reported on 08/06/2018), Disp: 30 tablet, Rfl: 12 .  psyllium (REGULOID) 0.52 g capsule, Take 0.52 g by mouth daily., Disp: , Rfl:   Review of Systems  Constitutional: Negative.   HENT: Negative.   Eyes: Negative.   Respiratory: Negative.   Cardiovascular: Negative.   Gastrointestinal: Negative.   Endocrine: Negative.   Genitourinary: Negative.   Musculoskeletal: Negative.   Skin: Negative.   Allergic/Immunologic: Negative.   Neurological: Negative.   Hematological:  Negative.   Psychiatric/Behavioral: Negative.     Social History   Tobacco Use  . Smoking status: Never Smoker  . Smokeless tobacco: Never Used  Substance Use Topics  . Alcohol use: No    Alcohol/week: 0.0 standard drinks      Objective:   BP 140/82 (BP Location: Left Arm, Patient Position: Sitting, Cuff Size: Normal)   Pulse (!) 58   Temp 98.7 F (37.1 C) (Oral)   Ht 5\' 1"  (1.549 m)   Wt 125 lb 9.6 oz (57 kg)   SpO2 97%   BMI 23.73 kg/m  Vitals:   08/06/18 1037  BP: 140/82  Pulse: (!) 58  Temp: 98.7 F (37.1 C)  TempSrc: Oral  SpO2: 97%  Weight: 125 lb 9.6 oz (57 kg)  Height: 5\' 1"  (1.549 m)     Physical Exam Constitutional:      Appearance: She is well-developed.  HENT:     Head: Normocephalic and atraumatic.     Right Ear: External ear normal.     Left Ear: External ear normal.     Nose: Nose normal.  Eyes:     Conjunctiva/sclera: Conjunctivae normal.     Pupils: Pupils are equal, round, and reactive to light.  Neck:     Musculoskeletal: Normal range of motion and neck supple.  Cardiovascular:     Rate and Rhythm: Normal rate and regular rhythm.     Heart sounds: Normal heart sounds.  Pulmonary:     Effort: Pulmonary effort is normal.     Breath sounds: Normal breath sounds.  Abdominal:     General: Bowel sounds are normal.     Palpations: Abdomen is soft.     Tenderness: There is no abdominal tenderness.  Genitourinary:    Rectum: Internal hemorrhoid present.  Musculoskeletal: Normal range of motion.  Skin:    General: Skin is warm and dry.  Neurological:     Mental Status: She is alert and oriented to person, place, and time.  Psychiatric:        Mood and Affect: Mood normal.        Behavior: Behavior normal.        Thought Content: Thought content normal.        Judgment: Judgment normal.         Assessment & Plan    1. Hypertension, unspecified type   2. Gastroesophageal reflux disease, esophagitis presence not  specified Check.  Try omeprazole twice daily.  Return to clinic 1 to 2 months  3. Depression, unspecified depression type Good control.  Continue to treat insomnia.RTC for CPE. - traZODone (DESYREL) 150 MG tablet; Take 1 tablet (150 mg total) by mouth at bedtime.  Dispense: 30 tablet; Refill: 11    I have done the exam and reviewed the above chart and  it is accurate to the best of my knowledge. Development worker, community has been used in this note in any air is in the dictation or transcription are unintentional.  Wilhemena Durie, MD  [place]

## 2018-08-06 NOTE — Patient Instructions (Signed)
Stop Zantac.   Start Omeprazole 20mg  twice daily.

## 2018-08-26 ENCOUNTER — Other Ambulatory Visit: Payer: Self-pay | Admitting: Family Medicine

## 2018-08-26 DIAGNOSIS — J309 Allergic rhinitis, unspecified: Secondary | ICD-10-CM

## 2018-08-26 DIAGNOSIS — F329 Major depressive disorder, single episode, unspecified: Secondary | ICD-10-CM

## 2018-08-26 DIAGNOSIS — F32A Depression, unspecified: Secondary | ICD-10-CM

## 2018-08-26 NOTE — Telephone Encounter (Signed)
Pharmacy requesting refills. Thanks!  

## 2018-09-10 ENCOUNTER — Telehealth: Payer: Self-pay | Admitting: Obstetrics and Gynecology

## 2018-09-10 ENCOUNTER — Other Ambulatory Visit: Payer: Self-pay | Admitting: Obstetrics and Gynecology

## 2018-09-10 NOTE — Telephone Encounter (Signed)
The patient called and stated that she would like to speak with a nurse if possible in regards to her having some medication issues. Please advise.

## 2018-09-11 MED ORDER — ESTRADIOL 10 MCG VA INST: 10.0000 ug | VAGINAL_INSERT | Freq: Every day | VAGINAL | 6 refills | Status: DC

## 2018-09-11 NOTE — Telephone Encounter (Signed)
Pt called to see what medication she needs to have refilled. Pt stated that she is needed her imvexxy. Medication was refilled and sent to the pharmacy.

## 2018-09-12 ENCOUNTER — Telehealth: Payer: Self-pay | Admitting: Obstetrics and Gynecology

## 2018-09-12 NOTE — Telephone Encounter (Signed)
Patient called stating estradiol falls under tier 4 with her insurance this year. She would like something else sent in that may be a tier 1 or tier 2 medication. She stated she thinks estrace may be covered.Thanks

## 2018-09-12 NOTE — Telephone Encounter (Signed)
Spoke with pt to let her know that I would have to speak to [facility] to see what type of medication she can take that would be covered by her insurance. Pt stated that she understood and is aware that [facility] is not in the office today.

## 2018-09-15 ENCOUNTER — Other Ambulatory Visit: Payer: Self-pay

## 2018-09-15 MED ORDER — ESTRADIOL 10 MCG VA TABS: 10.0000 ug | ORAL_TABLET | Freq: Every day | VAGINAL | 12 refills | Status: DC

## 2018-09-15 NOTE — Telephone Encounter (Signed)
Pt called and informed that vagifem was sent to her pharmacy since her insurance would not cover the imvexxy. Pt stated that she was grateful for anything that her insurance would cover.

## 2018-09-19 NOTE — Telephone Encounter (Signed)
Called the pharmacy and spoke with them concerning the prices of the pt's vaginal creams. I was informed that I was not able to use the discount card for her premarin due to her having medicaid.

## 2018-09-19 NOTE — Telephone Encounter (Signed)
Pt called to see if the medication estradiol was okay for her. Pt stated that the medication was over $200.00. pt was informed that I would call the pharmacy to see if I could use the discount we have in the office to help with the payment of Premarin.

## 2018-09-19 NOTE — Telephone Encounter (Signed)
Pt called and inform her of the what the pharmacist had stated. Pt stated that she would just pay for the premarin cream.

## 2018-09-23 ENCOUNTER — Encounter: Payer: Self-pay | Admitting: Obstetrics and Gynecology

## 2018-09-24 ENCOUNTER — Ambulatory Visit: Payer: Self-pay | Admitting: Family Medicine

## 2018-11-07 ENCOUNTER — Telehealth: Payer: Self-pay | Admitting: Family Medicine

## 2018-11-07 NOTE — Telephone Encounter (Signed)
Pt needs a refill on the Omeprazole 20 mg  [employer].  Dr Darnell Level changed her to taking twoa day so she has ran out  [employer]

## 2018-11-07 NOTE — Telephone Encounter (Signed)
[facility] to advise patient that she has refills at pharmacy.

## 2018-11-07 NOTE — Telephone Encounter (Signed)
Advised patient as below.  

## 2018-11-20 ENCOUNTER — Ambulatory Visit (INDEPENDENT_AMBULATORY_CARE_PROVIDER_SITE_OTHER): Payer: Medicare HMO | Admitting: Family Medicine

## 2018-11-20 ENCOUNTER — Encounter: Payer: Self-pay | Admitting: Family Medicine

## 2018-11-20 ENCOUNTER — Other Ambulatory Visit: Payer: Self-pay

## 2018-11-20 VITALS — BP 157/70 | HR 57 | Temp 98.6°F | Resp 16 | Ht 61.0 in | Wt 128.0 lb

## 2018-11-20 DIAGNOSIS — Z0000 Encounter for general adult medical examination without abnormal findings: Secondary | ICD-10-CM | POA: Diagnosis not present

## 2018-11-20 DIAGNOSIS — K219 Gastro-esophageal reflux disease without esophagitis: Secondary | ICD-10-CM | POA: Diagnosis not present

## 2018-11-20 MED ORDER — OMEPRAZOLE 20 MG PO CPDR: 20.0000 mg | DELAYED_RELEASE_CAPSULE | Freq: Two times a day (BID) | ORAL | 12 refills | Status: DC

## 2018-11-20 NOTE — Progress Notes (Signed)
Patient: Jocelyn Harris, Female    DOB: [birth year], 68 y.o.   MRN: [id number] Visit Date: 11/20/2018  Today's Provider: Wilhemena Durie, MD   Chief Complaint  Patient presents with  . Welcome to medicare visit  . Gastroesophageal Reflux   Subjective:     Welcome to Medicare visit LAKETTA SODERBERG is a 68 y.o. female. She feels well. She reports exercising occasionally. She reports she is sleeping well.  Colonoscopy- 02/04/2014. Normal. Repeat in 10 years.  Mammogram- 01/08/2018. Normal. Repeat 1 year. Pap- 04/11/2016. Normal. HPV positive. She was referred to GYN and work up was negative and to continue routine pap smears. Pap on 05/31/2017 HPV was negative.  Tdap- 12/23/2013   Review of Systems  Constitutional: Negative.   HENT: Negative.   Eyes: Negative.   Respiratory: Negative.   Cardiovascular: Negative.   Gastrointestinal: Negative.   Endocrine: Negative.   Genitourinary: Negative.   Musculoskeletal: Negative.   Skin: Negative.   Allergic/Immunologic: Negative.   Neurological: Negative.   Hematological: Negative.   Psychiatric/Behavioral: Negative.     Social History   Socioeconomic History  . Marital status: Divorced    Spouse name: Not on file  . Number of children: Not on file  . Years of education: Not on file  . Highest education level: Not on file  Occupational History  . Not on file  Social Needs  . Financial resource strain: Not on file  . Food insecurity:    Worry: Not on file    Inability: Not on file  . Transportation needs:    Medical: Not on file    Non-medical: Not on file  Tobacco Use  . Smoking status: Never Smoker  . Smokeless tobacco: Never Used  Substance and Sexual Activity  . Alcohol use: No    Alcohol/week: 0.0 standard drinks  . Drug use: No  . Sexual activity: Not Currently    Birth control/protection: None  Lifestyle  . Physical activity:    Days per week: Not on file    Minutes per session: Not on file  .  Stress: Not on file  Relationships  . Social connections:    Talks on phone: Not on file    Gets together: Not on file    Attends religious service: Not on file    Active member of club or organization: Not on file    Attends meetings of clubs or organizations: Not on file    Relationship status: Not on file  . Intimate partner violence:    Fear of current or ex partner: Not on file    Emotionally abused: Not on file    Physically abused: Not on file    Forced sexual activity: Not on file  Other Topics Concern  . Not on file  Social History Narrative  . Not on file    Past Medical History:  Diagnosis Date  . GERD (gastroesophageal reflux disease)   . Hypertension      Patient Active Problem List   Diagnosis Date Noted  . Fatigue 05/13/2018  . Pap smear abnormality of cervix/human papillomavirus (HPV) positive 06/10/2016  . Allergic rhinitis 01/27/2015  . Anxiety 01/27/2015  . Back pain, chronic 01/27/2015  . Clinical depression 01/27/2015  . Acid reflux 01/27/2015  . HLD (hyperlipidemia) 01/27/2015  . BP (high blood pressure) 01/27/2015  . Cannot sleep 01/27/2015  . 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  KNEE SURGERY Right   . TOE SURGERY    . TUBAL LIGATION      Her family history includes Cancer in her sister; Congestive Heart Failure in her mother; Diabetes in her mother; Fibromyalgia in her sister; Healthy in her daughter; Hypertension in her father and mother; Lung cancer in her father; Lupus in her sister.   Current Outpatient Medications:  .  busPIRone (BUSPAR) 10 MG tablet, TAKE 1 TABLET BY MOUTH THREE TIMES DAILY, Disp: 90 tablet, Rfl: 11 .  calcium gluconate 500 MG tablet, Take by mouth., Disp: , Rfl:  .  cetirizine (ZYRTEC) 10 MG tablet, Take 1 tablet (10 mg total) by mouth daily., Disp: 30 tablet, Rfl: 11 .  Cholecalciferol 1000 UNITS capsule, Take by mouth., Disp: , Rfl:  .  conjugated estrogens  (PREMARIN) vaginal cream, Place 1 Applicatorful vaginally 3 (three) times a week. Use 0.5 mg vaginally 2-3 times weekly, Disp: 42.5 g, Rfl: 12 .  Estradiol 10 MCG TABS vaginal tablet, Place 1 tablet (10 mcg total) vaginally daily., Disp: 30 tablet, Rfl: 12 .  fluticasone (FLONASE) 50 MCG/ACT nasal spray, USE 2 SPRAY IN EACH NOSTRIL DAILY, Disp: 16 g, Rfl: 12 .  ibuprofen (ADVIL,MOTRIN) 800 MG tablet, Take 1 tablet (800 mg total) by mouth 2 (two) times daily as needed., Disp: 60 tablet, Rfl: 11 .  lisinopril (PRINIVIL,ZESTRIL) 40 MG tablet, Take 1 tablet (40 mg total) by mouth daily., Disp: 90 tablet, Rfl: 3 .  Lysine 500 MG TABS, Take by mouth., Disp: , Rfl:  .  montelukast (SINGULAIR) 10 MG tablet, TAKE 1 TABLET BY MOUTH AT BEDTIME, Disp: 30 tablet, Rfl: 11 .  MULTIPLE VITAMINS PO, Take by mouth., Disp: , Rfl:  .  Omega-3 Fatty Acids (FISH OIL) 1000 MG CAPS, Take by mouth., Disp: , Rfl:  .  omeprazole (PRILOSEC) 20 MG capsule, TAKE ONE CAPSULE BY MOUTH EVERY MORNING, Disp: 30 capsule, Rfl: 12 .  psyllium (REGULOID) 0.52 g capsule, Take 0.52 g by mouth daily., Disp: , Rfl:  .  ranitidine (ZANTAC) 150 MG tablet, TAKE 1 TABLET BY MOUTH TWICE DAILY, Disp: 60 tablet, Rfl: 11 .  traZODone (DESYREL) 150 MG tablet, TAKE 1 TABLET BY MOUTH AT BEDTIME, Disp: 30 tablet, Rfl: 11 .  vitamin E 100 UNIT capsule, Take by mouth., Disp: , Rfl:  .  fexofenadine (ALLEGRA) 180 MG tablet, TAKE 1 TABLET BY MOUTH EVERY DAY (Patient not taking: Reported on 08/06/2018), Disp: 30 tablet, Rfl: 12  Patient Care Team: Jerrol Banana., MD as PCP - General (Family Medicine)    Objective:    Vitals: BP (!) 157/70   Pulse (!) 57   Temp 98.6 F (37 C)   Resp 16   Ht 5\' 1"  (1.549 m)   Wt 128 lb (58.1 kg)   BMI 24.19 kg/m   Physical Exam Constitutional:      Appearance: Normal appearance.  HENT:     Head: Normocephalic.     Right Ear: Tympanic membrane, ear canal and external ear normal.     Left Ear: Tympanic  membrane, ear canal and external ear normal.     Nose: Nose normal.     Mouth/Throat:     Mouth: Mucous membranes are moist.     Pharynx: Oropharynx is clear.  Eyes:     Extraocular Movements: Extraocular movements intact.     Conjunctiva/sclera: Conjunctivae normal.     Pupils: Pupils are equal, round, and reactive to light.  Neck:  Musculoskeletal: Normal range of motion and neck supple.  Cardiovascular:     Rate and Rhythm: Normal rate and regular rhythm.     Pulses: Normal pulses.     Heart sounds: Normal heart sounds.  Pulmonary:     Effort: Pulmonary effort is normal.     Breath sounds: Normal breath sounds.  Chest:     Breasts:        Right: Normal. No swelling, bleeding, inverted nipple, mass, nipple discharge, skin change or tenderness.        Left: Normal. No swelling, bleeding, inverted nipple, mass, nipple discharge, skin change or tenderness.  Abdominal:     General: Abdomen is flat. Bowel sounds are normal.     Palpations: Abdomen is soft.  Genitourinary:    General: Normal vulva.  Musculoskeletal: Normal range of motion.  Skin:    General: Skin is warm and dry.  Neurological:     Mental Status: She is alert and oriented to person, place, and time. Mental status is at baseline.  Psychiatric:        Mood and Affect: Mood normal.        Behavior: Behavior normal.        Thought Content: Thought content normal.        Judgment: Judgment normal.     Activities of Daily Living In your present state of health, do you have any difficulty performing the following activities: 11/20/2018  Hearing? Y  Vision? N  Difficulty concentrating or making decisions? N  Walking or climbing stairs? N  Dressing or bathing? N  Doing errands, shopping? N  Some recent data might be hidden    Fall Risk Assessment Fall Risk  11/20/2018 08/06/2018 09/10/2017 03/22/2016  Falls in the past year? 0 0 Yes No  Number falls in past yr: - - 1 -  Injury with Fall? 0 - Yes -  Follow up  Falls evaluation completed - - -     Depression Screen PHQ 2/9 Scores 11/20/2018 10/24/2018 08/06/2018 09/10/2017  PHQ - 2 Score 0 0 0 0  PHQ- 9 Score - - - 1    No flowsheet data found.    Assessment & Plan:     Welcome to Medicare Visit  Reviewed patient's Family Medical History Reviewed and updated list of patient's medical providers Assessment of cognitive impairment was done Assessed patient's functional ability Established a written schedule for health screening [place] Completed and Reviewed  Exercise Activities and Dietary recommendations Goals    . Exercise 150 min/wk Moderate Activity       Immunization History  Administered Date(s) Administered  . Influenza Split 04/27/2006, 06/06/2011  . Influenza, High Dose Seasonal PF 03/17/2018  . Influenza,inj,Quad PF,6+ Mos 03/11/2017  . Influenza-Unspecified 06/16/2015  . Tdap 12/23/2013  . Zoster 01/20/2014    Health Maintenance  Topic Date Due  . HIV Screening  12/31/1967  . DEXA SCAN  12/30/2017  . PNA vac Low Risk Adult (1 of 2 - PCV13) 12/30/2017  . INFLUENZA VACCINE  01/17/2019  . MAMMOGRAM  01/09/2020  . PAP SMEAR-Modifier  05/31/2020  . TETANUS/TDAP  12/24/2023  . COLONOSCOPY  02/05/2024  . Hepatitis C Screening  Completed     Discussed health benefits of physical activity, and encouraged her to engage in regular exercise appropriate for her age and condition.  1. Welcome to Medicare preventive visit   2. Gastroesophageal reflux disease, esophagitis presence not specified Double PPI.RTC 1 month. - omeprazole (PRILOSEC) 20  MG capsule; Take 1 capsule (20 mg total) by mouth 2 (two) times daily before a meal.  Dispense: 60 capsule; Refill: [place], MD  [facility]

## 2018-11-20 NOTE — Patient Instructions (Signed)
Start omeprazole 20mg  twice daily.

## 2018-12-01 ENCOUNTER — Other Ambulatory Visit: Payer: Self-pay | Admitting: Family Medicine

## 2018-12-01 DIAGNOSIS — J3089 Other allergic rhinitis: Secondary | ICD-10-CM

## 2018-12-23 ENCOUNTER — Ambulatory Visit (INDEPENDENT_AMBULATORY_CARE_PROVIDER_SITE_OTHER): Payer: Medicare HMO | Admitting: Family Medicine

## 2018-12-23 ENCOUNTER — Other Ambulatory Visit: Payer: Self-pay | Admitting: Family Medicine

## 2018-12-23 ENCOUNTER — Other Ambulatory Visit: Payer: Self-pay

## 2018-12-23 ENCOUNTER — Encounter: Payer: Self-pay | Admitting: Family Medicine

## 2018-12-23 VITALS — BP 132/70 | HR 54 | Temp 98.4°F | Resp 16 | Ht 61.0 in | Wt 128.0 lb

## 2018-12-23 DIAGNOSIS — I10 Essential (primary) hypertension: Secondary | ICD-10-CM | POA: Diagnosis not present

## 2018-12-23 DIAGNOSIS — M5432 Sciatica, left side: Secondary | ICD-10-CM

## 2018-12-23 DIAGNOSIS — E785 Hyperlipidemia, unspecified: Secondary | ICD-10-CM | POA: Diagnosis not present

## 2018-12-23 DIAGNOSIS — G8929 Other chronic pain: Secondary | ICD-10-CM

## 2018-12-23 DIAGNOSIS — M5442 Lumbago with sciatica, left side: Secondary | ICD-10-CM

## 2018-12-23 DIAGNOSIS — F32A Depression, unspecified: Secondary | ICD-10-CM

## 2018-12-23 DIAGNOSIS — K219 Gastro-esophageal reflux disease without esophagitis: Secondary | ICD-10-CM

## 2018-12-23 DIAGNOSIS — F329 Major depressive disorder, single episode, unspecified: Secondary | ICD-10-CM

## 2018-12-23 MED ORDER — SUCRALFATE 1 G PO TABS: 1.0000 g | ORAL_TABLET | Freq: Two times a day (BID) | ORAL | 0 refills | Status: DC

## 2018-12-23 MED ORDER — METHYLPREDNISOLONE ACETATE 40 MG/ML IJ SUSP: 40.0000 mg | Freq: Once | INTRAMUSCULAR | Status: DC

## 2018-12-23 NOTE — Progress Notes (Signed)
Patient: Jocelyn Harris Female    DOB: [birth year]   68 y.o.   MRN: [id number] Visit Date: 12/23/2018  Today's Provider: Wilhemena Durie, MD   Chief Complaint  Patient presents with  . Hypertension  . Gastroesophageal Reflux  . Sciatica   Subjective:    HPI  Hypertension, follow-up:  BP Readings from Last 3 Encounters:  12/23/18 132/70  11/20/18 (!) 157/70  08/06/18 140/82    She was last seen for hypertension 5 months ago.  BP at that visit was 140/82. Management since that visit includes no changes. She reports good compliance with treatment. She is not having side effects.  She is exercising. She is adherent to low salt diet.   Outside blood pressures are checked daily. She reports that it averages in the 140s/70s. She is experiencing lower extremity edema.  Patient denies exertional chest pressure/discomfort and palpitations.    Weight trend: stable Wt Readings from Last 3 Encounters:  12/23/18 128 lb (58.1 kg)  11/20/18 128 lb (58.1 kg)  08/06/18 125 lb 9.6 oz (57 kg)    Current diet: well balanced  GERD, follow up: Patient was last seen in the office 5 months ago. Since last OV, she was advised to d/c Zantac and start Prilosec 20mg  once daily. She reports that she is tolerating the medication well. She still has some reflux symptoms.  Sciatica  Patient reports that this is getting worse. It is mainly on the left side. She reports that she has occasional swelling in her left leg, and it is painful to walk at times. Ibuprofen helps take the "edge" off. She wonders if a steroid shot might help her.  Allergies  Allergen Reactions  . Augmentin [Amoxicillin-Pot Clavulanate] Nausea Only    Patient reports that her reflux was bad when she took medication  . Doxycycline Nausea Only and Nausea And Vomiting     Current Outpatient Medications:  .  busPIRone (BUSPAR) 10 MG tablet, TAKE 1 TABLET BY MOUTH THREE TIMES DAILY, Disp: 90 tablet, Rfl: 11 .   calcium gluconate 500 MG tablet, Take by mouth., Disp: , Rfl:  .  cetirizine (ZYRTEC) 10 MG tablet, Take 1 tablet (10 mg total) by mouth daily., Disp: 30 tablet, Rfl: 11 .  Cholecalciferol 1000 UNITS capsule, Take by mouth., Disp: , Rfl:  .  conjugated estrogens (PREMARIN) vaginal cream, Place 1 Applicatorful vaginally 3 (three) times a week. Use 0.5 mg vaginally 2-3 times weekly, Disp: 42.5 g, Rfl: 12 .  Estradiol 10 MCG TABS vaginal tablet, Place 1 tablet (10 mcg total) vaginally daily., Disp: 30 tablet, Rfl: 12 .  fluticasone (FLONASE) 50 MCG/ACT nasal spray, USE 2 SPRAYS IN EACH NOSTRIL DAILY, Disp: 16 g, Rfl: 12 .  ibuprofen (ADVIL,MOTRIN) 800 MG tablet, Take 1 tablet (800 mg total) by mouth 2 (two) times daily as needed., Disp: 60 tablet, Rfl: 11 .  lisinopril (PRINIVIL,ZESTRIL) 40 MG tablet, Take 1 tablet (40 mg total) by mouth daily., Disp: 90 tablet, Rfl: 3 .  Lysine 500 MG TABS, Take by mouth., Disp: , Rfl:  .  montelukast (SINGULAIR) 10 MG tablet, TAKE 1 TABLET BY MOUTH AT BEDTIME, Disp: 30 tablet, Rfl: 11 .  MULTIPLE VITAMINS PO, Take by mouth., Disp: , Rfl:  .  Omega-3 Fatty Acids (FISH OIL) 1000 MG CAPS, Take by mouth., Disp: , Rfl:  .  omeprazole (PRILOSEC) 20 MG capsule, Take 1 capsule (20 mg total) by mouth 2 (two) times daily before  a meal., Disp: 60 capsule, Rfl: 12 .  psyllium (REGULOID) 0.52 g capsule, Take 0.52 g by mouth daily., Disp: , Rfl:  .  traZODone (DESYREL) 150 MG tablet, TAKE 1 TABLET BY MOUTH AT BEDTIME, Disp: 30 tablet, Rfl: 11 .  vitamin E 100 UNIT capsule, Take by mouth., Disp: , Rfl:  .  fexofenadine (ALLEGRA) 180 MG tablet, TAKE 1 TABLET BY MOUTH EVERY DAY (Patient not taking: Reported on 08/06/2018), Disp: 30 tablet, Rfl: 12 .  ranitidine (ZANTAC) 150 MG tablet, TAKE 1 TABLET BY MOUTH TWICE DAILY (Patient not taking: Reported on 12/23/2018), Disp: 60 tablet, Rfl: 11  Review of Systems  Constitutional: Negative for activity change, chills, fatigue and unexpected  weight change.  Eyes: Negative.   Respiratory: Negative for cough and shortness of breath.   Cardiovascular: Positive for leg swelling. Negative for chest pain and palpitations.  Endocrine: Negative.   Musculoskeletal: Positive for myalgias. Negative for arthralgias.  Allergic/Immunologic: Negative for environmental allergies.  Neurological: Negative for dizziness and headaches.  Psychiatric/Behavioral: Negative.  Negative for agitation, self-injury, sleep disturbance and suicidal ideas. The patient is not nervous/anxious.     Social History   Tobacco Use  . Smoking status: Never Smoker  . Smokeless tobacco: Never Used  Substance Use Topics  . Alcohol use: No    Alcohol/week: 0.0 standard drinks      Objective:   BP 132/70 (BP Location: Right Arm, Patient Position: Sitting, Cuff Size: Normal)   Pulse (!) 54   Temp 98.4 F (36.9 C)   Resp 16   Ht 5\' 1"  (1.549 m)   Wt 128 lb (58.1 kg)   SpO2 98%   BMI 24.19 kg/m  Vitals:   12/23/18 0840  BP: 132/70  Pulse: (!) 54  Resp: 16  Temp: 98.4 F (36.9 C)  SpO2: 98%  Weight: 128 lb (58.1 kg)  Height: 5\' 1"  (1.549 m)     Physical Exam Vitals signs reviewed.  Constitutional:      Appearance: She is well-developed.  HENT:     Head: Normocephalic and atraumatic.     Right Ear: External ear normal.     Left Ear: External ear normal.     Nose: Nose normal.  Eyes:     Conjunctiva/sclera: Conjunctivae normal.     Pupils: Pupils are equal, round, and reactive to light.  Neck:     Musculoskeletal: Normal range of motion and neck supple.  Cardiovascular:     Rate and Rhythm: Normal rate and regular rhythm.     Heart sounds: Normal heart sounds.  Pulmonary:     Effort: Pulmonary effort is normal.     Breath sounds: Normal breath sounds.  Abdominal:     General: Bowel sounds are normal.     Palpations: Abdomen is soft.     Tenderness: There is no abdominal tenderness.  Genitourinary:    Rectum: Internal hemorrhoid  present.  Musculoskeletal: Normal range of motion.     Comments: Mild tenderness over left SI joint.  Skin:    General: Skin is warm and dry.  Neurological:     General: No focal deficit present.     Mental Status: She is alert and oriented to person, place, and time.  Psychiatric:        Mood and Affect: Mood normal.        Behavior: Behavior normal.        Thought Content: Thought content normal.        Judgment: Judgment  normal.      No results found for any visits on 12/23/18.     Assessment & Plan    1. Hypertension, unspecified type Controlled on Lisinopril. - Comprehensive metabolic panel  2. Gastroesophageal reflux disease, esophagitis presence not specified Try Carafate before meals and at bedtime. - CBC with Differential/Platelet  3. Depression, unspecified depression type In remission. - TSH  4. Sciatica of left side  - methylPREDNISolone acetate (DEPO-MEDROL) injection 40 mg - methylPREDNISolone acetate (DEPO-MEDROL) injection 40 mg  5. Hyperlipidemia, unspecified hyperlipidemia type  - Lipid panel  6. Chronic left-sided low back pain with left-sided sciatica Depo shot today which might help.She has pain over SI pain.Mat need PT referral for this.      Cranford Mon, MD  [facility]

## 2018-12-23 NOTE — Patient Instructions (Signed)

## 2018-12-24 LAB — CBC WITH DIFFERENTIAL/PLATELET
Basophils Absolute: 0.1 10*3/uL (ref 0.0–0.2)
Basos: 1 %
EOS (ABSOLUTE): 0.1 10*3/uL (ref 0.0–0.4)
Eos: 2 %
Hematocrit: 34.9 % (ref 34.0–46.6)
Hemoglobin: 11.3 g/dL (ref 11.1–15.9)
Immature Grans (Abs): 0 10*3/uL (ref 0.0–0.1)
Immature Granulocytes: 0 %
Lymphocytes Absolute: 2.4 10*3/uL (ref 0.7–3.1)
Lymphs: 34 %
MCH: 27.9 pg (ref 26.6–33.0)
MCHC: 32.4 g/dL (ref 31.5–35.7)
MCV: 86 fL (ref 79–97)
Monocytes Absolute: 0.5 10*3/uL (ref 0.1–0.9)
Monocytes: 8 %
Neutrophils Absolute: 3.9 10*3/uL (ref 1.4–7.0)
Neutrophils: 55 %
Platelets: 249 10*3/uL (ref 150–450)
RBC: 4.05 x10E6/uL (ref 3.77–5.28)
RDW: 13 % (ref 11.7–15.4)
WBC: 7 10*3/uL (ref 3.4–10.8)

## 2018-12-24 LAB — LIPID PANEL
Chol/HDL Ratio: 4.1 ratio (ref 0.0–4.4)
Cholesterol, Total: 227 mg/dL — ABNORMAL HIGH (ref 100–199)
HDL: 55 mg/dL (ref >39)
LDL Calculated: 136 mg/dL — ABNORMAL HIGH (ref 0–99)
Triglycerides: 180 mg/dL — ABNORMAL HIGH (ref 0–149)
VLDL Cholesterol Cal: 36 mg/dL (ref 5–40)

## 2018-12-24 LAB — COMPREHENSIVE METABOLIC PANEL
ALT: 19 IU/L (ref 0–32)
AST: 19 IU/L (ref 0–40)
Albumin/Globulin Ratio: 2.1 (ref 1.2–2.2)
Albumin: 4.4 g/dL (ref 3.8–4.8)
Alkaline Phosphatase: 69 IU/L (ref 39–117)
BUN/Creatinine Ratio: 24 (ref 12–28)
BUN: 20 mg/dL (ref 8–27)
Bilirubin Total: 0.3 mg/dL (ref 0.0–1.2)
CO2: 24 mmol/L (ref 20–29)
Calcium: 9.4 mg/dL (ref 8.7–10.3)
Chloride: 104 mmol/L (ref 96–106)
Creatinine, Ser: 0.84 mg/dL (ref 0.57–1.00)
GFR calc Af Amer: 84 mL/min/{1.73_m2} (ref >59)
GFR calc non Af Amer: 73 mL/min/{1.73_m2} (ref >59)
Globulin, Total: 2.1 g/dL (ref 1.5–4.5)
Glucose: 84 mg/dL (ref 65–99)
Potassium: 4.2 mmol/L (ref 3.5–5.2)
Sodium: 143 mmol/L (ref 134–144)
Total Protein: 6.5 g/dL (ref 6.0–8.5)

## 2018-12-24 LAB — TSH: TSH: 3.27 u[IU]/mL (ref 0.450–4.500)

## 2018-12-25 ENCOUNTER — Ambulatory Visit: Payer: Self-pay | Admitting: Family Medicine

## 2019-02-26 ENCOUNTER — Other Ambulatory Visit: Payer: Self-pay | Admitting: Family Medicine

## 2019-03-26 ENCOUNTER — Ambulatory Visit (INDEPENDENT_AMBULATORY_CARE_PROVIDER_SITE_OTHER): Payer: Medicare HMO | Admitting: Family Medicine

## 2019-03-26 ENCOUNTER — Other Ambulatory Visit: Payer: Self-pay

## 2019-03-26 ENCOUNTER — Encounter: Payer: Self-pay | Admitting: Family Medicine

## 2019-03-26 VITALS — BP 128/76 | HR 56 | Temp 98.1°F | Ht 61.0 in | Wt 127.0 lb

## 2019-03-26 DIAGNOSIS — I10 Essential (primary) hypertension: Secondary | ICD-10-CM | POA: Diagnosis not present

## 2019-03-26 DIAGNOSIS — Z780 Asymptomatic menopausal state: Secondary | ICD-10-CM

## 2019-03-26 DIAGNOSIS — J3089 Other allergic rhinitis: Secondary | ICD-10-CM | POA: Diagnosis not present

## 2019-03-26 DIAGNOSIS — Z23 Encounter for immunization: Secondary | ICD-10-CM | POA: Diagnosis not present

## 2019-03-26 DIAGNOSIS — L570 Actinic keratosis: Secondary | ICD-10-CM | POA: Diagnosis not present

## 2019-03-26 MED ORDER — CETIRIZINE HCL 10 MG PO TABS: 10.0000 mg | ORAL_TABLET | Freq: Every day | ORAL | 11 refills | Status: DC

## 2019-03-26 MED ORDER — LISINOPRIL 40 MG PO TABS: 40.0000 mg | ORAL_TABLET | Freq: Every day | ORAL | 3 refills | Status: DC

## 2019-03-26 NOTE — Progress Notes (Signed)
Patient: Jocelyn Harris Female    DOB: 1951/01/22   68 y.o.   MRN: [id number] Visit Date: 03/26/2019  Today's Provider: Wilhemena Durie, MD   Chief Complaint  Patient presents with  . Hypertension  . Gastroesophageal Reflux   Subjective:   HPI Patient comes in today for a follow up. She was last seen in the office 2 months ago. On last visit, she was treated with Carafate, and reports that this has helped with her GERD symptoms. She occasionally checks her BP at home, and reports that this has been normal. She does mention that she has had trouble with her allergies for the last week, and is requesting a refill of cetrizine 10mg  to be sent into the pharmacy.   BP Readings from Last 3 Encounters:  03/26/19 128/76  12/23/18 132/70  11/20/18 (!) 157/70   Wt Readings from Last 3 Encounters:  03/26/19 127 lb (57.6 kg)  12/23/18 128 lb (58.1 kg)  11/20/18 128 lb (58.1 kg)    Allergies  Allergen Reactions  . Augmentin [Amoxicillin-Pot Clavulanate] Nausea Only    Patient reports that her reflux was bad when she took medication  . Doxycycline Nausea Only and Nausea And Vomiting     Current Outpatient Medications:  .  busPIRone (BUSPAR) 10 MG tablet, TAKE 1 TABLET BY MOUTH THREE TIMES DAILY, Disp: 90 tablet, Rfl: 11 .  calcium gluconate 500 MG tablet, Take by mouth., Disp: , Rfl:  .  cetirizine (ZYRTEC) 10 MG tablet, Take 1 tablet (10 mg total) by mouth daily., Disp: 30 tablet, Rfl: 11 .  Cholecalciferol 1000 UNITS capsule, Take by mouth., Disp: , Rfl:  .  conjugated estrogens (PREMARIN) vaginal cream, Place 1 Applicatorful vaginally 3 (three) times a week. Use 0.5 mg vaginally 2-3 times weekly, Disp: 42.5 g, Rfl: 12 .  fluticasone (FLONASE) 50 MCG/ACT nasal spray, USE 2 SPRAYS IN EACH NOSTRIL DAILY, Disp: 16 g, Rfl: 12 .  ibuprofen (ADVIL) 800 MG tablet, TAKE 1 TABLET BY MOUTH TWICE DAILY AS NEEDED, Disp: 60 tablet, Rfl: 11 .  lisinopril (PRINIVIL,ZESTRIL) 40 MG tablet,  Take 1 tablet (40 mg total) by mouth daily., Disp: 90 tablet, Rfl: 3 .  Lysine 500 MG TABS, Take by mouth., Disp: , Rfl:  .  montelukast (SINGULAIR) 10 MG tablet, TAKE 1 TABLET BY MOUTH AT BEDTIME, Disp: 30 tablet, Rfl: 11 .  MULTIPLE VITAMINS PO, Take by mouth., Disp: , Rfl:  .  Omega-3 Fatty Acids (FISH OIL) 1000 MG CAPS, Take by mouth., Disp: , Rfl:  .  omeprazole (PRILOSEC) 20 MG capsule, Take 1 capsule (20 mg total) by mouth 2 (two) times daily before a meal., Disp: 60 capsule, Rfl: 12 .  psyllium (REGULOID) 0.52 g capsule, Take 0.52 g by mouth daily., Disp: , Rfl:  .  sucralfate (CARAFATE) 1 g tablet, Take 1 tablet (1 g total) by mouth 4 (four) times daily -  with meals and at bedtime., Disp: 120 tablet, Rfl: 11 .  traZODone (DESYREL) 150 MG tablet, TAKE 1 TABLET BY MOUTH AT BEDTIME, Disp: 30 tablet, Rfl: 11 .  vitamin E 100 UNIT capsule, Take by mouth., Disp: , Rfl:  .  Estradiol 10 MCG TABS vaginal tablet, Place 1 tablet (10 mcg total) vaginally daily. (Patient not taking: Reported on 03/26/2019), Disp: 30 tablet, Rfl: 12 .  fexofenadine (ALLEGRA) 180 MG tablet, TAKE 1 TABLET BY MOUTH EVERY DAY (Patient not taking: Reported on 08/06/2018), Disp: 30 tablet,  Rfl: 12 .  ranitidine (ZANTAC) 150 MG tablet, TAKE 1 TABLET BY MOUTH TWICE DAILY (Patient not taking: Reported on 12/23/2018), Disp: 60 tablet, Rfl: 11  Current Facility-Administered Medications:  .  methylPREDNISolone acetate (DEPO-MEDROL) injection 40 mg, 40 mg, Intramuscular, Once, Jerrol Banana., MD .  methylPREDNISolone acetate (DEPO-MEDROL) injection 40 mg, 40 mg, Intramuscular, Once, Jerrol Banana., MD  Review of Systems  Constitutional: Negative for activity change and fatigue.  Eyes: Negative.   Respiratory: Negative for cough and shortness of breath.   Cardiovascular: Negative for chest pain, palpitations and leg swelling.  Gastrointestinal: Negative.   Endocrine: Negative.   Musculoskeletal: Negative for  arthralgias.  Allergic/Immunologic: Positive for environmental allergies.  Neurological: Negative for dizziness, light-headedness and headaches.  Hematological: Negative.   Psychiatric/Behavioral: Negative for agitation, self-injury, sleep disturbance and suicidal ideas.    Social History   Tobacco Use  . Smoking status: Never Smoker  . Smokeless tobacco: Never Used  Substance Use Topics  . Alcohol use: No    Alcohol/week: 0.0 standard drinks      Objective:   BP 128/76   Pulse (!) 56   Temp 98.1 F (36.7 C)   Ht 5\' 1"  (1.549 m)   Wt 127 lb (57.6 kg)   SpO2 98%   BMI 24.00 kg/m  Vitals:   03/26/19 0848  BP: 128/76  Pulse: (!) 56  Temp: 98.1 F (36.7 C)  SpO2: 98%  Weight: 127 lb (57.6 kg)  Height: 5\' 1"  (1.549 m)  Body mass index is 24 kg/m.   Physical Exam Vitals signs reviewed.  Constitutional:      Appearance: She is well-developed.  HENT:     Head: Normocephalic and atraumatic.     Right Ear: External ear normal.     Left Ear: External ear normal.     Nose: Nose normal.  Eyes:     Conjunctiva/sclera: Conjunctivae normal.     Pupils: Pupils are equal, round, and reactive to light.  Neck:     Musculoskeletal: Normal range of motion and neck supple.  Cardiovascular:     Rate and Rhythm: Normal rate and regular rhythm.     Heart sounds: Normal heart sounds.  Pulmonary:     Effort: Pulmonary effort is normal.     Breath sounds: Normal breath sounds.  Abdominal:     General: Bowel sounds are normal.     Palpations: Abdomen is soft.     Tenderness: There is no abdominal tenderness.  Genitourinary:    Rectum: Internal hemorrhoid present.  Musculoskeletal: Normal range of motion.  Skin:    General: Skin is warm and dry.     Comments: Some AKs noted.  Neurological:     General: No focal deficit present.     Mental Status: She is alert and oriented to person, place, and time.  Psychiatric:        Mood and Affect: Mood normal.        Behavior:  Behavior normal.        Thought Content: Thought content normal.        Judgment: Judgment normal.      No results found for any visits on 03/26/19.     Assessment & Plan    1. Hypertension, unspecified type Good control. - lisinopril (ZESTRIL) 40 MG tablet; Take 1 tablet (40 mg total) by mouth daily.  Dispense: 90 tablet; Refill: 3  2. Seasonal allergic rhinitis due to other allergic trigger  - cetirizine (  ZYRTEC) 10 MG tablet; Take 1 tablet (10 mg total) by mouth daily.  Dispense: 30 tablet; Refill: 11  3. Post-menopausal Obtain BMD - DG Bone Density; Future  4. AK (actinic keratosis) Further dermatology - Ambulatory referral to Dermatology  5. Need for pneumococcal vaccine  - Pneumococcal polysaccharide vaccine 23-valent greater than or equal to 2yo subcutaneous/IM     Wilhemena Durie, MD  [facility]

## 2019-04-06 ENCOUNTER — Other Ambulatory Visit: Payer: Self-pay | Admitting: Family Medicine

## 2019-04-06 DIAGNOSIS — J3089 Other allergic rhinitis: Secondary | ICD-10-CM

## 2019-04-06 NOTE — Telephone Encounter (Signed)
Medication filled.  

## 2019-04-09 ENCOUNTER — Other Ambulatory Visit: Payer: Self-pay | Admitting: Family Medicine

## 2019-04-09 DIAGNOSIS — Z1231 Encounter for screening mammogram for malignant neoplasm of breast: Secondary | ICD-10-CM

## 2019-05-05 ENCOUNTER — Other Ambulatory Visit: Payer: BLUE CROSS/BLUE SHIELD

## 2019-05-12 ENCOUNTER — Other Ambulatory Visit: Payer: BLUE CROSS/BLUE SHIELD

## 2019-05-21 ENCOUNTER — Ambulatory Visit: Payer: Self-pay | Admitting: *Deleted

## 2019-05-21 NOTE — Telephone Encounter (Signed)
Pt called with complaints of increased BP 166/91 (05/21/2019 at 08455), and she is having headaches; this was taken with a home cuff on her left upper arm; her BP 158/86 AM, 165/90 2000, 153/80 2130; she says that her BP normally runs 130s/80s;the pt says that her headache started about 2 weeks ago, but she did not check her BP; recommendations made per nurse triage protocol; she verbalized understanding; the pt would like to be seen by Dr Rosanna Randy, and she is not off until 05/25/2019; algorithm completed; pt offered and accepted virtual appointment with Dr Rosanna Randy 05/25/2019 at 0900; will route to office for notification. Reason for Disposition . Systolic BP  >= 0000000 OR Diastolic >= 123XX123  Answer Assessment - Initial Assessment Questions 1. BLOOD PRESSURE: "What is the blood pressure?" "Did you take at least two measurements 5 minutes apart?" 166/91 and 158/86 2. ONSET: "When did you take your blood pressure?"     05/21/2019 at 0845, and AM 05/20/2019 3. HOW: "How did you obtain the blood pressure?" (e.g., visiting nurse, automatic home BP monitor)     Home cuff left upper arm 4. HISTORY: "Do you have a history of high blood pressure?"    yes 5. MEDICATIONS: "Are you taking any medications for blood pressure?" "Have you missed any doses recently?"   no 6. OTHER SYMPTOMS: "Do you have any symptoms?" (e.g., headache, chest pain, blurred vision, difficulty breathing, weakness)     Headache; cough, breathing hard at work due to mask, congestion 7. PREGNANCY: "Is there any chance you are pregnant?" "When was your last menstrual period?"    no  Protocols used: HIGH BLOOD PRESSURE-A-AH

## 2019-05-21 NOTE — Telephone Encounter (Signed)
Please advise 

## 2019-05-25 ENCOUNTER — Telehealth (INDEPENDENT_AMBULATORY_CARE_PROVIDER_SITE_OTHER): Payer: Medicare HMO | Admitting: Family Medicine

## 2019-05-25 ENCOUNTER — Other Ambulatory Visit: Payer: Self-pay

## 2019-05-25 DIAGNOSIS — I10 Essential (primary) hypertension: Secondary | ICD-10-CM

## 2019-05-25 DIAGNOSIS — R058 Other specified cough: Secondary | ICD-10-CM

## 2019-05-25 DIAGNOSIS — R059 Cough, unspecified: Secondary | ICD-10-CM

## 2019-05-25 DIAGNOSIS — R05 Cough: Secondary | ICD-10-CM

## 2019-05-25 DIAGNOSIS — Z20822 Contact with and (suspected) exposure to covid-19: Secondary | ICD-10-CM

## 2019-05-25 MED ORDER — AMLODIPINE BESYLATE 5 MG PO TABS: 5.0000 mg | ORAL_TABLET | Freq: Every day | ORAL | 3 refills | Status: DC

## 2019-05-25 NOTE — Progress Notes (Signed)
Patient: Jocelyn Harris Female    DOB: June 28, 1950   68 y.o.   MRN: [id number] Visit Date: 05/25/2019  Today's Provider: Wilhemena Durie, MD   Chief Complaint  Patient presents with   Hypertension   Nasal Congestion   Subjective:    Virtual Visit via Telephone Note  I connected with Jocelyn Harris on 05/25/19 at  9:00 AM EST by telephone and verified that I am speaking with the correct person using two identifiers.  Location: Patient: Home Provider: Office   I discussed the limitations, risks, security and privacy concerns of performing an evaluation and management service by telephone and the availability of in person appointments. I also discussed with the patient that there may be a patient responsible charge related to this service. The patient expressed understanding and agreed to proceed.    Cough This is a new problem. The current episode started in the past 7 days. The cough is non-productive. Associated symptoms include nasal congestion. Pertinent negatives include no chills or fever. Nothing aggravates the symptoms. The treatment provided no relief.  Hypertension This is a chronic problem. The current episode started in the past 7 days. The problem has been gradually worsening since onset. The problem is uncontrolled.  Patient has had a cough without fever her for about 4 days.  She states she feels as though it is settling in her chest.  She has some postnasal drainage.  She has developed some mid thoracic back pain in the past couple of days.  She has not knowingly been exposed any by with Covid. She states her blood pressures been elevated for the past week or so.  This morning it was 183/72.  She wonders if her hip pain is contributing to this.  Allergies  Allergen Reactions   Augmentin [Amoxicillin-Pot Clavulanate] Nausea Only    Patient reports that her reflux was bad when she took medication   Doxycycline Nausea Only and Nausea And Vomiting      Current Outpatient Medications:    busPIRone (BUSPAR) 10 MG tablet, TAKE 1 TABLET BY MOUTH THREE TIMES DAILY, Disp: 90 tablet, Rfl: 11   calcium gluconate 500 MG tablet, Take by mouth., Disp: , Rfl:    cetirizine (ZYRTEC) 10 MG tablet, TAKE 1 TABLET BY MOUTH DAILY, Disp: 30 tablet, Rfl: 11   Cholecalciferol 1000 UNITS capsule, Take by mouth., Disp: , Rfl:    conjugated estrogens (PREMARIN) vaginal cream, Place 1 Applicatorful vaginally 3 (three) times a week. Use 0.5 mg vaginally 2-3 times weekly, Disp: 42.5 g, Rfl: 12   fluticasone (FLONASE) 50 MCG/ACT nasal spray, USE 2 SPRAYS IN EACH NOSTRIL DAILY, Disp: 16 g, Rfl: 12   ibuprofen (ADVIL) 800 MG tablet, TAKE 1 TABLET BY MOUTH TWICE DAILY AS NEEDED, Disp: 60 tablet, Rfl: 11   lisinopril (ZESTRIL) 40 MG tablet, Take 1 tablet (40 mg total) by mouth daily., Disp: 90 tablet, Rfl: 3   Lysine 500 MG TABS, Take by mouth., Disp: , Rfl:    montelukast (SINGULAIR) 10 MG tablet, TAKE 1 TABLET BY MOUTH AT BEDTIME, Disp: 30 tablet, Rfl: 11   MULTIPLE VITAMINS PO, Take by mouth., Disp: , Rfl:    Omega-3 Fatty Acids (FISH OIL) 1000 MG CAPS, Take by mouth., Disp: , Rfl:    omeprazole (PRILOSEC) 20 MG capsule, Take 1 capsule (20 mg total) by mouth 2 (two) times daily before a meal., Disp: 60 capsule, Rfl: 12   psyllium (REGULOID) 0.52 g capsule,  Take 0.52 g by mouth daily., Disp: , Rfl:    sucralfate (CARAFATE) 1 g tablet, Take 1 tablet (1 g total) by mouth 4 (four) times daily -  with meals and at bedtime., Disp: 120 tablet, Rfl: 11   traZODone (DESYREL) 150 MG tablet, TAKE 1 TABLET BY MOUTH AT BEDTIME, Disp: 30 tablet, Rfl: 11   vitamin E 100 UNIT capsule, Take by mouth., Disp: , Rfl:   Current Facility-Administered Medications:    methylPREDNISolone acetate (DEPO-MEDROL) injection 40 mg, 40 mg, Intramuscular, Once, Jerrol Banana., MD   methylPREDNISolone acetate (DEPO-MEDROL) injection 40 mg, 40 mg, Intramuscular, Once,  Jerrol Banana., MD  Review of Systems  Constitutional: Positive for appetite change and fatigue. Negative for activity change, chills, diaphoresis, fever and unexpected weight change.  HENT: Positive for congestion and sinus pressure.   Eyes: Negative.   Respiratory: Positive for cough.   Gastrointestinal: Negative.   Endocrine: Negative.   Genitourinary: Negative.   Musculoskeletal: Negative.   Skin: Negative.   Allergic/Immunologic: Negative.   Neurological: Negative.   Hematological: Negative.   Psychiatric/Behavioral: Positive for sleep disturbance.    Social History   Tobacco Use   Smoking status: Never Smoker   Smokeless tobacco: Never Used  Substance Use Topics   Alcohol use: No    Alcohol/week: 0.0 standard drinks      Objective:   There were no vitals taken for this visit. There were no vitals filed for this visit.There is no height or weight on file to calculate BMI.   Physical Exam   No results found for any visits on 05/25/19.     Assessment & Plan     1. Respiratory tract congestion with cough Robitussin and fluids. - Novel Coronavirus, NAA ([facility])  2. Hypertension, unspecified type Add amlodipine 5 mg daily.  Return to clinic 1 month.  3. Cough Especially in view of the back pain will obtain Covid testing.  She has had 4 days of symptoms.  Out of work until [employer] test is back. - Novel Coronavirus, NAA ([facility])  I discussed the assessment and treatment plan with the patient. The patient was provided an opportunity to ask questions and all were answered. The patient agreed with the plan and demonstrated an understanding of the instructions.   The patient was advised to call back or seek an in-person evaluation if the symptoms worsen or if the condition fails to improve as anticipated.  I provided 10 minutes of non-face-to-face time during this encounter.    Richard Cranford Mon, MD  [facility]

## 2019-05-27 ENCOUNTER — Other Ambulatory Visit: Payer: Self-pay

## 2019-05-27 ENCOUNTER — Ambulatory Visit (INDEPENDENT_AMBULATORY_CARE_PROVIDER_SITE_OTHER): Payer: Medicare HMO | Admitting: Family Medicine

## 2019-05-27 ENCOUNTER — Ambulatory Visit
Admission: RE | Admit: 2019-05-27 | Discharge: 2019-05-27 | Disposition: A | Discharge status: Home / Self Care | Payer: Medicare HMO | Source: Ambulatory Visit | Attending: Family Medicine | Admitting: Family Medicine

## 2019-05-27 ENCOUNTER — Telehealth: Payer: Self-pay

## 2019-05-27 DIAGNOSIS — R091 Pleurisy: Secondary | ICD-10-CM

## 2019-05-27 DIAGNOSIS — J069 Acute upper respiratory infection, unspecified: Secondary | ICD-10-CM

## 2019-05-27 DIAGNOSIS — R079 Chest pain, unspecified: Secondary | ICD-10-CM | POA: Diagnosis not present

## 2019-05-27 DIAGNOSIS — R05 Cough: Secondary | ICD-10-CM | POA: Diagnosis not present

## 2019-05-27 LAB — NOVEL CORONAVIRUS, NAA: SARS-CoV-2, NAA: NOT DETECTED

## 2019-05-27 NOTE — Telephone Encounter (Signed)
Called and spoke with the patient and informed her of her Covid results. The patient had spoken with a provider in our office earlier today and was sent for a chest xray. She gave verbal understanding of her result.

## 2019-05-27 NOTE — Progress Notes (Signed)
Patient: Jocelyn Harris Female    DOB: [birth year]   68 y.o.   MRN: [id number] Visit Date: 05/27/2019  Today's Provider: Lavon Paganini, MD   Chief Complaint  Patient presents with  . Cough   Subjective:     HPI   Virtual Visit via Telephone Note  I connected with [name] on 05/27/19 at  1:40 PM EST by telephone and verified that I am speaking with the correct person using two identifiers.  Location: Patient location: home [facility] Persons involved in the visit: patient, provider   I discussed the limitations, risks, security and privacy concerns of performing an evaluation and management service by telephone and the availability of in person appointments. I also discussed with the patient that there may be a patient responsible charge related to this service. The patient expressed understanding and agreed to proceed.  HPI  Patient was seen by PCP on 05/25/2019 for cough and other URI symptoms in addition to a burning sensation in her mid back.  She was tested for Covid and this has resulted as negative.  She has had a positive exposure to Covid through her boss at work.  She states work is wondering when she can return to work.  Patient is reporting ongoing subjective fever, chills, nasal congestion, bilateral ear pain, somewhat productive cough, headaches.  The burning in her chest and back is been ongoing for several days and seems to be getting slightly worse.  This hurts with every breath.  She denies shortness of breath or tachycardia.  She has tried Robitussin cough syrup with mild relief.  First day of her symptoms was 05/20/2019   Allergies  Allergen Reactions  . Augmentin [Amoxicillin-Pot Clavulanate] Nausea Only    Patient reports that her reflux was bad when she took medication  . Doxycycline Nausea Only and Nausea And Vomiting     Current Outpatient Medications:  .  amLODipine (NORVASC) 5 MG tablet, Take 1  tablet (5 mg total) by mouth daily., Disp: 90 tablet, Rfl: 3 .  busPIRone (BUSPAR) 10 MG tablet, TAKE 1 TABLET BY MOUTH THREE TIMES DAILY, Disp: 90 tablet, Rfl: 11 .  calcium gluconate 500 MG tablet, Take by mouth., Disp: , Rfl:  .  cetirizine (ZYRTEC) 10 MG tablet, TAKE 1 TABLET BY MOUTH DAILY, Disp: 30 tablet, Rfl: 11 .  Cholecalciferol 1000 UNITS capsule, Take by mouth., Disp: , Rfl:  .  conjugated estrogens (PREMARIN) vaginal cream, Place 1 Applicatorful vaginally 3 (three) times a week. Use 0.5 mg vaginally 2-3 times weekly, Disp: 42.5 g, Rfl: 12 .  fluticasone (FLONASE) 50 MCG/ACT nasal spray, USE 2 SPRAYS IN EACH NOSTRIL DAILY, Disp: 16 g, Rfl: 12 .  ibuprofen (ADVIL) 800 MG tablet, TAKE 1 TABLET BY MOUTH TWICE DAILY AS NEEDED, Disp: 60 tablet, Rfl: 11 .  lisinopril (ZESTRIL) 40 MG tablet, Take 1 tablet (40 mg total) by mouth daily., Disp: 90 tablet, Rfl: 3 .  Lysine 500 MG TABS, Take by mouth., Disp: , Rfl:  .  montelukast (SINGULAIR) 10 MG tablet, TAKE 1 TABLET BY MOUTH AT BEDTIME, Disp: 30 tablet, Rfl: 11 .  MULTIPLE VITAMINS PO, Take by mouth., Disp: , Rfl:  .  Omega-3 Fatty Acids (FISH OIL) 1000 MG CAPS, Take by mouth., Disp: , Rfl:  .  omeprazole (PRILOSEC) 20 MG capsule, Take 1 capsule (20 mg total) by mouth 2 (two) times daily before a meal., Disp: 60 capsule, Rfl: 12 .  psyllium (REGULOID) 0.52 g capsule, Take 0.52 g by mouth daily., Disp: , Rfl:  .  sucralfate (CARAFATE) 1 g tablet, Take 1 tablet (1 g total) by mouth 4 (four) times daily -  with meals and at bedtime., Disp: 120 tablet, Rfl: 11 .  traZODone (DESYREL) 150 MG tablet, TAKE 1 TABLET BY MOUTH AT BEDTIME, Disp: 30 tablet, Rfl: 11 .  vitamin E 100 UNIT capsule, Take by mouth., Disp: , Rfl:   Review of Systems  Constitutional: Positive for activity change, chills, fatigue and fever. Negative for appetite change and diaphoresis.  HENT: Positive for congestion, ear pain, postnasal drip, rhinorrhea, sinus pain and sneezing.  Negative for dental problem, drooling, ear discharge, facial swelling, hearing loss, mouth sores, nosebleeds, sore throat, trouble swallowing and voice change.   Eyes: Negative.   Respiratory: Positive for cough and chest tightness. Negative for apnea, choking, shortness of breath, wheezing and stridor.   Cardiovascular: Positive for chest pain. Negative for palpitations and leg swelling.  Gastrointestinal: Negative.   Genitourinary: Negative.   Musculoskeletal: Positive for myalgias.  Neurological: Positive for headaches. Negative for dizziness, tremors, seizures, syncope, speech difficulty, weakness and light-headedness.  Psychiatric/Behavioral: Negative.     Social History   Tobacco Use  . Smoking status: Never Smoker  . Smokeless tobacco: Never Used  Substance Use Topics  . Alcohol use: No    Alcohol/week: 0.0 standard drinks      Objective:   There were no vitals taken for this visit. There were no vitals filed for this visit.There is no height or weight on file to calculate BMI.   Physical Exam Speaking in full sentences in no apparent distress  No results found for any visits on 05/27/19.     Assessment & Plan    Follow Up Instructions:    I discussed the assessment and treatment plan with the patient. The patient was provided an opportunity to ask questions and all were answered. The patient agreed with the plan and demonstrated an understanding of the instructions.   The patient was advised to call back or seek an in-person evaluation if the symptoms worsen or if the condition fails to improve as anticipated.  I provided 25 minutes of non-face-to-face time during this encounter.   1. Upper respiratory tract infection, unspecified type 2. Pleuritis - 7 days of symptoms total with worsening pleuritic chest pain -Despite negative Covid test, her symptoms do seem consistent with possible COVID-19 infection -Discussed 30% false-negative rate of current Covid  testing methods -Advised her to continue to self isolate for at least 10 days from the start of symptoms and until she is fever free for at least 1 to 2 days -We will obtain chest x-ray as her pleuritic chest pain is worsening -Do not suspect PE at this time as she is not tachycardic or shortness of breath, but discussed strict return and emergent precautions -Continue symptomatic management and discussed natural course of COVID-19 -Further treatment pending chest x-ray results - DG Chest 2 View; Future    The entirety of the information documented in the History of Present Illness, Review of Systems and Physical Exam were personally obtained by me. Portions of this information were initially documented by Ashley Royalty, CMA and reviewed by me for thoroughness and accuracy.    Dajuana Palen, Dionne Bucy, MD MPH [facility]

## 2019-05-27 NOTE — Telephone Encounter (Signed)
-----   Message from Jerrol Banana., MD sent at 05/27/2019  9:05 AM EST ----- No Covid detected

## 2019-05-28 ENCOUNTER — Telehealth: Payer: Self-pay

## 2019-05-28 NOTE — Telephone Encounter (Signed)
Seen by patient Jocelyn Harris on 05/28/2019 10:08 AM EST

## 2019-05-28 NOTE — Telephone Encounter (Signed)
Copied from [place] [id number]. Topic: General - Inquiry >> May 28, 2019 11:34 AM Mathis Bud wrote: Reason for CRM: patient has questions regarding her lab results. Call back [phone]

## 2019-05-28 NOTE — Telephone Encounter (Signed)
-----   Message from Virginia Crews, MD sent at 05/28/2019  8:32 AM EST ----- Normal chest Xray. No pneumonia.  Suspect viral illness as we discussed.  Continue supportive care.

## 2019-05-29 NOTE — Telephone Encounter (Signed)
Called and spoke with patient about her questions regarding her Xray and results. She stated that she still feels the burning in her chest and back and was advised that it may be viral and to take OTC medications to aid in her symptoms and to drink plenty of fluids and call our clinic if her symptoms worsen. She gave verbal understanding.

## 2019-07-09 ENCOUNTER — Ambulatory Visit
Admission: RE | Admit: 2019-07-09 | Discharge: 2019-07-09 | Disposition: A | Discharge status: Home / Self Care | Payer: Medicare HMO | Source: Ambulatory Visit | Attending: Family Medicine | Admitting: Family Medicine

## 2019-07-09 DIAGNOSIS — Z1231 Encounter for screening mammogram for malignant neoplasm of breast: Secondary | ICD-10-CM | POA: Diagnosis not present

## 2019-07-13 ENCOUNTER — Telehealth: Payer: Self-pay

## 2019-07-13 ENCOUNTER — Other Ambulatory Visit: Payer: Self-pay | Admitting: Family Medicine

## 2019-07-13 DIAGNOSIS — R928 Other abnormal and inconclusive findings on diagnostic imaging of breast: Secondary | ICD-10-CM

## 2019-07-13 NOTE — Telephone Encounter (Signed)
Copied from [place] [id number]. Topic: General - Other >> Jul 13, 2019 10:36 AM Rainey Pines A wrote: Patient requesting callback from nurse to go over imaging results for mammogram. Please advise

## 2019-07-13 NOTE — Telephone Encounter (Signed)
Please advise MM results?

## 2019-07-14 NOTE — Telephone Encounter (Signed)
Mammography should always do this now--

## 2019-07-14 NOTE — Telephone Encounter (Signed)
Patient was advised. Patient also wanted to know if she can come in for ov and get a cortisone injection for her sciatica pain? Please advise?

## 2019-07-16 ENCOUNTER — Ambulatory Visit
Admission: RE | Admit: 2019-07-16 | Discharge: 2019-07-16 | Disposition: A | Discharge status: Home / Self Care | Payer: Medicare HMO | Source: Ambulatory Visit | Attending: Family Medicine | Admitting: Family Medicine

## 2019-07-16 DIAGNOSIS — R928 Other abnormal and inconclusive findings on diagnostic imaging of breast: Secondary | ICD-10-CM | POA: Diagnosis not present

## 2019-07-16 DIAGNOSIS — R922 Inconclusive mammogram: Secondary | ICD-10-CM | POA: Diagnosis not present

## 2019-07-28 ENCOUNTER — Ambulatory Visit (INDEPENDENT_AMBULATORY_CARE_PROVIDER_SITE_OTHER): Payer: Medicare HMO | Admitting: Family Medicine

## 2019-07-28 ENCOUNTER — Other Ambulatory Visit: Payer: Self-pay

## 2019-07-28 ENCOUNTER — Encounter: Payer: Self-pay | Admitting: Family Medicine

## 2019-07-28 VITALS — BP 152/80 | HR 63 | Temp 97.1°F | Resp 16 | Ht 61.0 in | Wt 128.0 lb

## 2019-07-28 DIAGNOSIS — S93402A Sprain of unspecified ligament of left ankle, initial encounter: Secondary | ICD-10-CM | POA: Diagnosis not present

## 2019-07-28 DIAGNOSIS — F32A Depression, unspecified: Secondary | ICD-10-CM

## 2019-07-28 DIAGNOSIS — M5432 Sciatica, left side: Secondary | ICD-10-CM | POA: Diagnosis not present

## 2019-07-28 DIAGNOSIS — F329 Major depressive disorder, single episode, unspecified: Secondary | ICD-10-CM | POA: Diagnosis not present

## 2019-07-28 MED ORDER — METHYLPREDNISOLONE ACETATE 40 MG/ML IJ SUSP
40.0000 mg | Freq: Once | INTRAMUSCULAR | Status: AC
  Administered 2019-07-28: 16:00:00 40 mg via INTRAMUSCULAR

## 2019-07-28 MED ORDER — TRAZODONE HCL 150 MG PO TABS: 150.0000 mg | ORAL_TABLET | Freq: Every day | ORAL | 11 refills | Status: DC

## 2019-07-28 NOTE — Progress Notes (Signed)
Patient: Jocelyn Harris Female    DOB: 1950-09-08   69 y.o.   MRN: [id number] Visit Date: 07/28/2019  Today's Provider: Wilhemena Durie, MD   Chief Complaint  Patient presents with  . Foot Pain    left   Subjective:     HPI   Patient states she has had left foot pain, swelling and burning for weeks. Patient states her left stays swollen and painful constantly. Patient states the whole left foot is painful and burning. Swelling seems to only be around the ankle and in the toes. Also has pain with and without weight bearing. Patient has been treating symptoms with ibuprofen with no relief.  She probably turned her ankle a week ago.  Allergies  Allergen Reactions  . Augmentin [Amoxicillin-Pot Clavulanate] Nausea Only    Patient reports that her reflux was bad when she took medication  . Doxycycline Nausea Only and Nausea And Vomiting     Current Outpatient Medications:  .  amLODipine (NORVASC) 5 MG tablet, Take 1 tablet (5 mg total) by mouth daily., Disp: 90 tablet, Rfl: 3 .  busPIRone (BUSPAR) 10 MG tablet, TAKE 1 TABLET BY MOUTH THREE TIMES DAILY, Disp: 90 tablet, Rfl: 11 .  calcium gluconate 500 MG tablet, Take by mouth., Disp: , Rfl:  .  cetirizine (ZYRTEC) 10 MG tablet, TAKE 1 TABLET BY MOUTH DAILY, Disp: 30 tablet, Rfl: 11 .  Cholecalciferol 1000 UNITS capsule, Take by mouth., Disp: , Rfl:  .  conjugated estrogens (PREMARIN) vaginal cream, Place 1 Applicatorful vaginally 3 (three) times a week. Use 0.5 mg vaginally 2-3 times weekly, Disp: 42.5 g, Rfl: 12 .  fluticasone (FLONASE) 50 MCG/ACT nasal spray, USE 2 SPRAYS IN EACH NOSTRIL DAILY, Disp: 16 g, Rfl: 12 .  ibuprofen (ADVIL) 800 MG tablet, TAKE 1 TABLET BY MOUTH TWICE DAILY AS NEEDED, Disp: 60 tablet, Rfl: 11 .  lisinopril (ZESTRIL) 40 MG tablet, Take 1 tablet (40 mg total) by mouth daily., Disp: 90 tablet, Rfl: 3 .  Lysine 500 MG TABS, Take by mouth., Disp: , Rfl:  .  montelukast (SINGULAIR) 10 MG tablet, TAKE  1 TABLET BY MOUTH AT BEDTIME, Disp: 30 tablet, Rfl: 11 .  MULTIPLE VITAMINS PO, Take by mouth., Disp: , Rfl:  .  Omega-3 Fatty Acids (FISH OIL) 1000 MG CAPS, Take by mouth., Disp: , Rfl:  .  omeprazole (PRILOSEC) 20 MG capsule, Take 1 capsule (20 mg total) by mouth 2 (two) times daily before a meal., Disp: 60 capsule, Rfl: 12 .  psyllium (REGULOID) 0.52 g capsule, Take 0.52 g by mouth daily., Disp: , Rfl:  .  sucralfate (CARAFATE) 1 g tablet, Take 1 tablet (1 g total) by mouth 4 (four) times daily -  with meals and at bedtime., Disp: 120 tablet, Rfl: 11 .  traZODone (DESYREL) 150 MG tablet, TAKE 1 TABLET BY MOUTH AT BEDTIME, Disp: 30 tablet, Rfl: 11 .  vitamin E 100 UNIT capsule, Take by mouth., Disp: , Rfl:   Review of Systems  Constitutional: Negative for appetite change, chills, fatigue and fever.  HENT: Negative.   Eyes: Negative.   Respiratory: Negative for chest tightness and shortness of breath.   Cardiovascular: Negative for chest pain and palpitations.  Gastrointestinal: Negative for abdominal pain, nausea and vomiting.  Endocrine: Negative.   Musculoskeletal: Positive for arthralgias.  Allergic/Immunologic: Negative.   Neurological: Negative for dizziness and weakness.  Hematological: Negative.   Psychiatric/Behavioral: The patient is nervous/anxious.  Social History   Tobacco Use  . Smoking status: Never Smoker  . Smokeless tobacco: Never Used  Substance Use Topics  . Alcohol use: No    Alcohol/week: 0.0 standard drinks      Objective:   BP (!) 152/80 (BP Location: Left Arm, Patient Position: Sitting, Cuff Size: Normal)   Pulse 63   Temp (!) 97.1 F (36.2 C) (Other (Comment))   Resp 16   Ht 5\' 1"  (1.549 m)   Wt 128 lb (58.1 kg)   SpO2 97%   BMI 24.19 kg/m  Vitals:   07/28/19 1507  BP: (!) 152/80  Pulse: 63  Resp: 16  Temp: (!) 97.1 F (36.2 C)  TempSrc: Other (Comment)  SpO2: 97%  Weight: 128 lb (58.1 kg)  Height: 5\' 1"  (1.549 m)  Body mass index  is 24.19 kg/m.   Physical Exam Vitals reviewed.  Constitutional:      Appearance: She is well-developed.  HENT:     Head: Normocephalic and atraumatic.     Right Ear: External ear normal.     Left Ear: External ear normal.     Nose: Nose normal.  Eyes:     Conjunctiva/sclera: Conjunctivae normal.     Pupils: Pupils are equal, round, and reactive to light.  Cardiovascular:     Rate and Rhythm: Normal rate and regular rhythm.     Heart sounds: Normal heart sounds.  Pulmonary:     Effort: Pulmonary effort is normal.     Breath sounds: Normal breath sounds.  Abdominal:     General: Bowel sounds are normal.     Palpations: Abdomen is soft.     Tenderness: There is no abdominal tenderness.  Genitourinary:    Rectum: Internal hemorrhoid present.  Musculoskeletal:        General: Normal range of motion.     Cervical back: Normal range of motion and neck supple.     Right lower leg: No edema.     Left lower leg: No edema.     Comments: Mild swelling and tenderness just below left lateral malleolus.  Skin:    General: Skin is warm and dry.  Neurological:     General: No focal deficit present.     Mental Status: She is alert and oriented to person, place, and time.  Psychiatric:        Mood and Affect: Mood normal.        Behavior: Behavior normal.        Thought Content: Thought content normal.        Judgment: Judgment normal.      No results found for any visits on 07/28/19.     Assessment & Plan    1. Depression, unspecified depression type  - traZODone (DESYREL) 150 MG tablet; Take 1 tablet (150 mg total) by mouth at bedtime.  Dispense: 30 tablet; Refill: 11  2. Sciatica of left side May need imaging or referral to PT. - methylPREDNISolone acetate (DEPO-MEDROL) injection 40 mg 3.Left Ankle Sprain  Follow up in [place] as scheduled.   I,Harvin Konicek,acting as a scribe for Wilhemena Durie, MD.,have documented all relevant documentation on the behalf of  Wilhemena Durie, MD,as directed by  Wilhemena Durie, MD while in the presence of Wilhemena Durie, MD.      Wilhemena Durie, MD  [facility]

## 2019-07-30 DIAGNOSIS — Z23 Encounter for immunization: Secondary | ICD-10-CM | POA: Diagnosis not present

## 2019-08-27 DIAGNOSIS — Z23 Encounter for immunization: Secondary | ICD-10-CM | POA: Diagnosis not present

## 2019-09-02 ENCOUNTER — Other Ambulatory Visit: Payer: Self-pay | Admitting: Family Medicine

## 2019-09-02 DIAGNOSIS — F329 Major depressive disorder, single episode, unspecified: Secondary | ICD-10-CM

## 2019-09-02 DIAGNOSIS — F32A Depression, unspecified: Secondary | ICD-10-CM

## 2019-09-02 MED ORDER — BUSPIRONE HCL 10 MG PO TABS: 10.0000 mg | ORAL_TABLET | Freq: Three times a day (TID) | ORAL | 11 refills | Status: DC

## 2019-09-02 NOTE — Telephone Encounter (Signed)
[facility] faxed refill request for the following medications:  busPIRone (BUSPAR) 10 MG tablet  Please advise.  Thanks, [employer]

## 2019-09-16 ENCOUNTER — Other Ambulatory Visit: Payer: Self-pay | Admitting: Family Medicine

## 2019-09-16 DIAGNOSIS — J309 Allergic rhinitis, unspecified: Secondary | ICD-10-CM

## 2019-09-16 NOTE — Telephone Encounter (Signed)
Requested Prescriptions  Pending Prescriptions Disp Refills  . montelukast (SINGULAIR) 10 MG tablet [Pharmacy Med Name: MONTELUKAST 10MG  TABLETS] 90 tablet 3    Sig: TAKE 1 TABLET BY MOUTH AT BEDTIME     Pulmonology:  Leukotriene Inhibitors Passed - 09/16/2019  3:38 AM      Passed - Valid encounter within last 12 months    Recent Outpatient Visits          1 month ago Sciatica of left side   [facility] Jerrol Banana., MD   3 months ago Upper respiratory tract infection, unspecified type   [facility], Dionne Bucy, MD   3 months ago Respiratory tract congestion with cough   [facility] Jerrol Banana., MD   5 months ago Hypertension, unspecified type   [facility] Jerrol Banana., MD   8 months ago Hypertension, unspecified type   [facility] Jerrol Banana., MD      Future Appointments            In 2 weeks Jerrol Banana., MD [facility], [place]

## 2019-09-24 ENCOUNTER — Other Ambulatory Visit: Payer: Self-pay

## 2019-09-24 ENCOUNTER — Ambulatory Visit
Admission: RE | Admit: 2019-09-24 | Discharge: 2019-09-24 | Disposition: A | Discharge status: Home / Self Care | Payer: Medicare HMO | Source: Ambulatory Visit | Attending: Physician Assistant | Admitting: Physician Assistant

## 2019-09-24 ENCOUNTER — Encounter: Payer: Self-pay | Admitting: Physician Assistant

## 2019-09-24 ENCOUNTER — Ambulatory Visit (INDEPENDENT_AMBULATORY_CARE_PROVIDER_SITE_OTHER): Payer: Medicare HMO | Admitting: Physician Assistant

## 2019-09-24 ENCOUNTER — Ambulatory Visit: Payer: Self-pay | Admitting: Family Medicine

## 2019-09-24 VITALS — BP 130/70 | HR 69 | Temp 96.8°F | Wt 127.6 lb

## 2019-09-24 DIAGNOSIS — M79672 Pain in left foot: Secondary | ICD-10-CM | POA: Diagnosis not present

## 2019-09-24 DIAGNOSIS — M25572 Pain in left ankle and joints of left foot: Secondary | ICD-10-CM | POA: Insufficient documentation

## 2019-09-24 MED ORDER — MELOXICAM 7.5 MG PO TABS: 7.5000 mg | ORAL_TABLET | Freq: Every day | ORAL | 0 refills | Status: DC

## 2019-09-24 NOTE — Patient Instructions (Signed)
Ankle Pain The ankle joint holds your body weight and allows you to move around. Ankle pain can occur on either side or the back of one ankle or both ankles. Ankle pain may be sharp and burning or dull and aching. There may be tenderness, stiffness, redness, or warmth around the ankle. Many things can cause ankle pain, including an injury to the area and overuse of the ankle. Follow these instructions at home: Activity  Rest your ankle as told by your health care provider. Avoid any activities that cause ankle pain.  Do not use the injured limb to support your body weight until your health care provider says that you can. Use crutches as told by your health care provider.  Do exercises as told by your health care provider.  Ask your health care provider when it is safe to drive if you have a brace on your ankle. If you have a brace:  Wear the brace as told by your health care provider. Remove it only as told by your health care provider.  Loosen the brace if your toes tingle, become numb, or turn cold and blue.  Keep the brace clean.  If the brace is not waterproof: ? Do not let it get wet. ? Cover it with a watertight covering when you take a bath or shower. If you were given an elastic bandage:   Remove it when you take a bath or a shower.  Try not to move your ankle very much, but wiggle your toes from time to time. This helps to prevent swelling.  Adjust the bandage to make it more comfortable if it feels too tight.  Loosen the bandage if you have numbness or tingling in your foot or if your foot turns cold and blue. Managing pain, stiffness, and swelling   If directed, put ice on the painful area. ? If you have a removable brace or elastic bandage, remove it as told by your health care provider. ? Put ice in a plastic bag. ? Place a towel between your skin and the bag. ? Leave the ice on for 20 minutes, 2-3 times a day.  Move your toes often to avoid stiffness and to  lessen swelling.  Raise (elevate) your ankle above the level of your heart while you are sitting or lying down. General instructions  Record information about your pain. Writing down the following may be helpful for you and your health care provider: ? How often you have ankle pain. ? Where the pain is located. ? What the pain feels like.  If treatment involves wearing a prescribed shoe or insole, make sure you wear it correctly and for as long as told by your health care provider.  Take over-the-counter and prescription medicines only as told by your health care provider.  Keep all follow-up visits as told by your health care provider. This is important. Contact a health care provider if:  Your pain gets worse.  Your pain is not relieved with medicines.  You have a fever or chills.  You are having more trouble with walking.  You have new symptoms. Get help right away if:  Your foot, leg, toes, or ankle: ? Tingles or becomes numb. ? Becomes swollen. ? Turns pale or blue. Summary  Ankle pain can occur on either side or the back of one ankle or both ankles.  Ankle pain may be sharp and burning or dull and aching.  Rest your ankle as told by your health care provider.   If told, apply ice to the area.  Take over-the-counter and prescription medicines only as told by your health care provider. This information is not intended to replace advice given to you by your health care provider. Make sure you discuss any questions you have with your health care provider. Document Revised: 09/23/2018 Document Reviewed: 12/11/2017 Elsevier Patient Education  2020 [employer]  

## 2019-09-24 NOTE — Progress Notes (Signed)
Patient: Jocelyn Harris Female    DOB: 1951/02/16   69 y.o.   MRN: [id number] Visit Date: 09/24/2019  Today's Provider: Trinna Post, PA-C   Chief Complaint  Patient presents with  . Ankle Pain   Subjective:    I, Jocelyn Harris,CMA am acting as a Education administrator for [employer].  Ankle Pain  The incident occurred more than 1 week ago. There was no injury mechanism. The pain is present in the left ankle. The quality of the pain is described as burning, shooting and aching. The pain is at a severity of 8/10. The pain is moderate. Associated symptoms include an inability to bear weight and tingling. Pertinent negatives include no numbness. She reports no foreign bodies present. The symptoms are aggravated by movement and weight bearing. She has tried NSAIDs for the symptoms. The treatment provided mild relief.   Left ankle pain ongoing for 2 months. She had seen PCP 2 months ago and thought it was a probably sprain due to selling and pain. She got a steroid injection at that time which was for her back. This did not help her ankle. She continues to have pain which worsens with standing and walking. She has never broken left ankle. She works Scientist, research (medical) and currently wears [employer] and tennis shoes at work. She has tried arch support sneakers which made her foot hurt worse.  Allergies  Allergen Reactions  . Augmentin [Amoxicillin-Pot Clavulanate] Nausea Only    Patient reports that her reflux was bad when she took medication  . Doxycycline Nausea Only and Nausea And Vomiting     Current Outpatient Medications:  .  amLODipine (NORVASC) 5 MG tablet, Take 1 tablet (5 mg total) by mouth daily., Disp: 90 tablet, Rfl: 3 .  busPIRone (BUSPAR) 10 MG tablet, Take 1 tablet (10 mg total) by mouth 3 (three) times daily., Disp: 90 tablet, Rfl: 11 .  calcium gluconate 500 MG tablet, Take by mouth., Disp: , Rfl:  .  cetirizine (ZYRTEC) 10 MG tablet, TAKE 1 TABLET BY MOUTH DAILY, Disp:  30 tablet, Rfl: 11 .  Cholecalciferol 1000 UNITS capsule, Take by mouth., Disp: , Rfl:  .  fluticasone (FLONASE) 50 MCG/ACT nasal spray, USE 2 SPRAYS IN EACH NOSTRIL DAILY, Disp: 16 g, Rfl: 12 .  ibuprofen (ADVIL) 800 MG tablet, TAKE 1 TABLET BY MOUTH TWICE DAILY AS NEEDED, Disp: 60 tablet, Rfl: 11 .  lisinopril (ZESTRIL) 40 MG tablet, Take 1 tablet (40 mg total) by mouth daily., Disp: 90 tablet, Rfl: 3 .  Lysine 500 MG TABS, Take by mouth., Disp: , Rfl:  .  montelukast (SINGULAIR) 10 MG tablet, TAKE 1 TABLET BY MOUTH AT BEDTIME, Disp: 90 tablet, Rfl: 3 .  MULTIPLE VITAMINS PO, Take by mouth., Disp: , Rfl:  .  Omega-3 Fatty Acids (FISH OIL) 1000 MG CAPS, Take by mouth., Disp: , Rfl:  .  omeprazole (PRILOSEC) 20 MG capsule, Take 1 capsule (20 mg total) by mouth 2 (two) times daily before a meal., Disp: 60 capsule, Rfl: 12 .  psyllium (REGULOID) 0.52 g capsule, Take 0.52 g by mouth daily., Disp: , Rfl:  .  sucralfate (CARAFATE) 1 g tablet, Take 1 tablet (1 g total) by mouth 4 (four) times daily -  with meals and at bedtime., Disp: 120 tablet, Rfl: 11 .  traZODone (DESYREL) 150 MG tablet, Take 1 tablet (150 mg total) by mouth at bedtime., Disp: 30 tablet, Rfl: 11 .  vitamin  E 100 UNIT capsule, Take by mouth., Disp: , Rfl:  .  conjugated estrogens (PREMARIN) vaginal cream, Place 1 Applicatorful vaginally 3 (three) times a week. Use 0.5 mg vaginally 2-3 times weekly (Patient not taking: Reported on 09/24/2019), Disp: 42.5 g, Rfl: 12  Review of Systems  Constitutional: Negative.   Respiratory: Negative.   Cardiovascular: Negative for leg swelling.  Neurological: Positive for tingling. Negative for numbness.    Social History   Tobacco Use  . Smoking status: Never Smoker  . Smokeless tobacco: Never Used  Substance Use Topics  . Alcohol use: No    Alcohol/week: 0.0 standard drinks      Objective:   BP 130/70 (BP Location: Left Arm, Patient Position: Sitting, Cuff Size: Normal)   Pulse 69    Temp (!) 96.8 F (36 C) (Temporal)   Wt 127 lb 9.6 oz (57.9 kg)   SpO2 99%   BMI 24.11 kg/m  Vitals:   09/24/19 1013  BP: 130/70  Pulse: 69  Temp: (!) 96.8 F (36 C)  TempSrc: Temporal  SpO2: 99%  Weight: 127 lb 9.6 oz (57.9 kg)  Body mass index is 24.11 kg/m.   Physical Exam Constitutional:      Appearance: Normal appearance.  Musculoskeletal:     Right lower leg: No edema.     Left lower leg: 1+ Edema present.     Right ankle: No swelling, deformity, ecchymosis or lacerations. No tenderness. Normal range of motion.     Left ankle: No swelling, deformity, ecchymosis or lacerations. Tenderness present over the lateral malleolus. Normal range of motion.     Right foot: Normal range of motion. No deformity.     Left foot: Normal range of motion. No deformity.  Feet:     Right foot:     Skin integrity: Skin integrity normal.     Left foot:     Skin integrity: Skin integrity normal.  Neurological:     Mental Status: She is alert.      No results found for any visits on 09/24/19.     Assessment & Plan    1. Acute left ankle pain  DDx: sprain, fracture, arthritis. Xrays as below. Trial of NSAIDs. May need follow up with orthopedist or podiatrist.  - DG Ankle Complete Left; Future - DG Foot Complete Left; Future - meloxicam (MOBIC) 7.5 MG tablet; Take 1 tablet (7.5 mg total) by mouth daily.  Dispense: 30 tablet; Refill: 0  The entirety of the information documented in the History of Present Illness, Review of Systems and Physical Exam were personally obtained by me. Portions of this information were initially documented by [facility] and reviewed by me for thoroughness and accuracy.      Trinna Post, PA-C  [facility]

## 2019-10-01 ENCOUNTER — Ambulatory Visit: Payer: Self-pay | Admitting: Family Medicine

## 2019-10-12 NOTE — Progress Notes (Signed)
Established patient visit   Patient: Jocelyn Harris   DOB: [birth year]   69 y.o. Female  MRN: [id number] Visit Date: 10/15/2019  Today's healthcare provider: Wilhemena Durie, MD   Chief Complaint  Patient presents with  . Follow-up  . Hypertension   Subjective    HPI  Patient does not like that her shoes are tight and she has mild swelling in her feet and ankle on amlodipine/since starting amlodipine. Hypertension, follow-up  BP Readings from Last 3 Encounters:  10/15/19 135/73  09/24/19 130/70  07/28/19 (!) 152/80   She was last seen for hypertension 6 months ago.  BP at that visit was 128/76. Management since that visit includes; Controlled. She reports good compliance with treatment. She is having side effects. Fatigue, Bloated She is not exercising. She is adherent to low salt diet.   Outside blood pressures are 143/74.  She does not smoke.  Use of agents associated with hypertension: NSAIDS.   --------------------------------------------------------------------    Past Medical History:  Diagnosis Date  . GERD (gastroesophageal reflux disease)   . Hypertension        Medications: Outpatient Medications Prior to Visit  Medication Sig  . amLODipine (NORVASC) 5 MG tablet Take 1 tablet (5 mg total) by mouth daily.  . busPIRone (BUSPAR) 10 MG tablet Take 1 tablet (10 mg total) by mouth 3 (three) times daily.  . calcium gluconate 500 MG tablet Take by mouth.  . cetirizine (ZYRTEC) 10 MG tablet TAKE 1 TABLET BY MOUTH DAILY  . Cholecalciferol 1000 UNITS capsule Take by mouth.  . fluticasone (FLONASE) 50 MCG/ACT nasal spray USE 2 SPRAYS IN EACH NOSTRIL DAILY  . ibuprofen (ADVIL) 800 MG tablet TAKE 1 TABLET BY MOUTH TWICE DAILY AS NEEDED  . lisinopril (ZESTRIL) 40 MG tablet Take 1 tablet (40 mg total) by mouth daily.  Marland Kitchen Lysine 500 MG TABS Take by mouth.  . meloxicam (MOBIC) 7.5 MG tablet Take 1 tablet (7.5 mg total) by mouth daily.  . montelukast  (SINGULAIR) 10 MG tablet TAKE 1 TABLET BY MOUTH AT BEDTIME  . MULTIPLE VITAMINS PO Take by mouth.  . Omega-3 Fatty Acids (FISH OIL) 1000 MG CAPS Take by mouth.  Marland Kitchen omeprazole (PRILOSEC) 20 MG capsule Take 1 capsule (20 mg total) by mouth 2 (two) times daily before a meal.  . psyllium (REGULOID) 0.52 g capsule Take 0.52 g by mouth daily.  . sucralfate (CARAFATE) 1 g tablet Take 1 tablet (1 g total) by mouth 4 (four) times daily -  with meals and at bedtime.  . traZODone (DESYREL) 150 MG tablet Take 1 tablet (150 mg total) by mouth at bedtime.  . vitamin E 100 UNIT capsule Take by mouth.  . conjugated estrogens (PREMARIN) vaginal cream Place 1 Applicatorful vaginally 3 (three) times a week. Use 0.5 mg vaginally 2-3 times weekly (Patient not taking: Reported on 09/24/2019)   No facility-administered medications prior to visit.    Review of Systems  Constitutional: Negative for appetite change, chills, fatigue and fever.  Eyes: Negative.   Respiratory: Negative for chest tightness and shortness of breath.   Cardiovascular: Negative for chest pain and palpitations.  Gastrointestinal: Negative for abdominal pain, nausea and vomiting.  Endocrine: Negative.   Genitourinary: Negative.   Allergic/Immunologic: Negative.   Neurological: Negative for dizziness and weakness.  Hematological: Negative.   Psychiatric/Behavioral: Negative.         Objective    BP 135/73 (BP Location: Right Arm, Patient Position: Sitting,  Cuff Size: Large)   Pulse 67   Temp (!) 96.9 F (36.1 C) (Other (Comment))   Resp 16   Ht 5\' 1"  (1.549 m)   Wt 129 lb (58.5 kg)   SpO2 97%   BMI 24.37 kg/m  BP Readings from Last 3 Encounters:  10/15/19 135/73  09/24/19 130/70  07/28/19 (!) 152/80   Wt Readings from Last 3 Encounters:  10/15/19 129 lb (58.5 kg)  09/24/19 127 lb 9.6 oz (57.9 kg)  07/28/19 128 lb (58.1 kg)      Physical Exam Vitals reviewed.  Constitutional:      Appearance: She is well-developed.    HENT:     Head: Normocephalic and atraumatic.     Right Ear: External ear normal.     Left Ear: External ear normal.     Nose: Nose normal.  Eyes:     Conjunctiva/sclera: Conjunctivae normal.     Pupils: Pupils are equal, round, and reactive to light.  Cardiovascular:     Rate and Rhythm: Normal rate and regular rhythm.     Heart sounds: Normal heart sounds.  Pulmonary:     Effort: Pulmonary effort is normal.     Breath sounds: Normal breath sounds.  Abdominal:     General: Bowel sounds are normal.     Palpations: Abdomen is soft.     Tenderness: There is no abdominal tenderness.  Genitourinary:    Rectum: Internal hemorrhoid present.  Musculoskeletal:        General: Normal range of motion.     Cervical back: Normal range of motion and neck supple.  Skin:    General: Skin is warm and dry.  Neurological:     General: No focal deficit present.     Mental Status: She is alert and oriented to person, place, and time.  Psychiatric:        Mood and Affect: Mood normal.        Behavior: Behavior normal.        Thought Content: Thought content normal.        Judgment: Judgment normal.       No results found for any visits on 10/15/19.  Assessment & Plan    1. Hypertension, unspecified type Stop amlodipine. Work on diet and exercise. Follow up in 2-3 months. I think the mild swelling in her feet will resolve off of amlodipine. 2.  General anxiety disorder Chronic issue Return in about 3 months (around 01/14/2020).      I, Wilhemena Durie, MD, have reviewed all documentation for this visit. The documentation on 10/19/19 for the exam, diagnosis, procedures, and orders are all accurate and complete.    Drea Jurewicz Cranford Mon, MD  [facility] [phone] (phone) [phone] (fax)  [place]

## 2019-10-15 ENCOUNTER — Encounter: Payer: Self-pay | Admitting: Family Medicine

## 2019-10-15 ENCOUNTER — Other Ambulatory Visit: Payer: Self-pay

## 2019-10-15 ENCOUNTER — Ambulatory Visit (INDEPENDENT_AMBULATORY_CARE_PROVIDER_SITE_OTHER): Payer: Medicare HMO | Admitting: Family Medicine

## 2019-10-15 VITALS — BP 135/73 | HR 67 | Temp 96.9°F | Resp 16 | Ht 61.0 in | Wt 129.0 lb

## 2019-10-15 DIAGNOSIS — F419 Anxiety disorder, unspecified: Secondary | ICD-10-CM

## 2019-10-15 DIAGNOSIS — I10 Essential (primary) hypertension: Secondary | ICD-10-CM | POA: Diagnosis not present

## 2019-10-15 NOTE — Patient Instructions (Addendum)
Stop amlodipine. Work on diet and exercise. Follow up in 2-3 months.

## 2019-11-25 NOTE — Progress Notes (Signed)
Subjective:   Jocelyn Harris is a 69 y.o. female who presents for an Initial Medicare Annual Wellness Visit.  I connected with Jocelyn Harris today by telephone and verified that I am speaking with the correct person using two identifiers. Location patient: home Location provider: work Persons participating in the virtual visit: patient, provider.   I discussed the limitations, risks, security and privacy concerns of performing an evaluation and management service by telephone and the availability of in person appointments. I also discussed with the patient that there may be a patient responsible charge related to this service. The patient expressed understanding and verbally consented to this telephonic visit.    Interactive audio and video telecommunications were attempted between this provider and patient, however failed, due to patient having technical difficulties OR patient did not have access to video capability.  We continued and completed visit with audio only.  Review of Systems    N/A  Cardiac Risk Factors include: advanced age (>71men, >63 women);dyslipidemia;hypertension     Objective:    Today's Vitals   11/26/19 0940  PainSc: 0-No pain   There is no height or weight on file to calculate BMI.  Advanced Directives 11/26/2019 02/20/2017 02/15/2017 03/12/2016 02/02/2015  Does Patient Have a Medical Advance Directive? No No No No No  Would patient like information on creating a medical advance directive? No - Patient declined - No - Patient declined - -    Current Medications (verified) Outpatient Encounter Medications as of 11/26/2019  Medication Sig  . busPIRone (BUSPAR) 10 MG tablet Take 1 tablet (10 mg total) by mouth 3 (three) times daily.  . calcium gluconate 500 MG tablet Take 1 tablet by mouth daily.   . cetirizine (ZYRTEC) 10 MG tablet TAKE 1 TABLET BY MOUTH DAILY  . Cholecalciferol 1000 UNITS capsule Take 1,000 Units by mouth daily.   . fluticasone (FLONASE)  50 MCG/ACT nasal spray USE 2 SPRAYS IN EACH NOSTRIL DAILY  . ibuprofen (ADVIL) 800 MG tablet TAKE 1 TABLET BY MOUTH TWICE DAILY AS NEEDED  . lisinopril (ZESTRIL) 40 MG tablet Take 1 tablet (40 mg total) by mouth daily.  Marland Kitchen Lysine 500 MG TABS Take by mouth as needed.   . montelukast (SINGULAIR) 10 MG tablet TAKE 1 TABLET BY MOUTH AT BEDTIME  . MULTIPLE VITAMINS PO Take by mouth daily.   . Omega-3 Fatty Acids (FISH OIL) 1000 MG CAPS Take by mouth daily.   Marland Kitchen omeprazole (PRILOSEC) 20 MG capsule Take 1 capsule (20 mg total) by mouth 2 (two) times daily before a meal.  . sucralfate (CARAFATE) 1 g tablet Take 1 tablet (1 g total) by mouth 4 (four) times daily -  with meals and at bedtime.  . traZODone (DESYREL) 150 MG tablet Take 1 tablet (150 mg total) by mouth at bedtime.  Marland Kitchen amLODipine (NORVASC) 5 MG tablet Take 1 tablet (5 mg total) by mouth daily. (Patient not taking: Reported on 11/26/2019)  . conjugated estrogens (PREMARIN) vaginal cream Place 1 Applicatorful vaginally 3 (three) times a week. Use 0.5 mg vaginally 2-3 times weekly (Patient not taking: Reported on 09/24/2019)  . meloxicam (MOBIC) 7.5 MG tablet Take 1 tablet (7.5 mg total) by mouth daily. (Patient not taking: Reported on 11/26/2019)  . psyllium (REGULOID) 0.52 g capsule Take 0.52 g by mouth daily. (Patient not taking: Reported on 11/26/2019)  . vitamin E 100 UNIT capsule Take 100 Units by mouth daily.  (Patient not taking: Reported on 11/26/2019)   No facility-administered  encounter medications on file as of 11/26/2019.    Allergies (verified) Augmentin [amoxicillin-pot clavulanate] and Doxycycline   History: Past Medical History:  Diagnosis Date  . GERD (gastroesophageal reflux disease)   . Hypertension    Past Surgical History:  Procedure Laterality Date  . KNEE SURGERY Right   . TOE SURGERY    . TUBAL LIGATION     Family History  Problem Relation Age of Onset  . Diabetes Mother   . Hypertension Mother   . Congestive  Heart Failure Mother   . Hypertension Father   . Lung cancer Father   . Fibromyalgia Sister   . Lupus Sister   . Cancer Sister        of blood  . Healthy Daughter    Social History   Socioeconomic History  . Marital status: Divorced    Spouse name: Not on file  . Number of children: 1  . Years of education: Not on file  . Highest education level: Some college, no degree  Occupational History  . Occupation: customer service    Comment: part time  Tobacco Use  . Smoking status: Never Smoker  . Smokeless tobacco: Never Used  Vaping Use  . Vaping Use: Never used  Substance and Sexual Activity  . Alcohol use: No    Alcohol/week: 0.0 standard drinks  . Drug use: No  . Sexual activity: Not Currently    Birth control/protection: None  Other Topics Concern  . Not on file  Social History Narrative  . Not on file   Social Determinants of Health   Financial Resource Strain: Low Risk   . Difficulty of Paying Living Expenses: Not hard at all  Food Insecurity: No Food Insecurity  . Worried About Charity fundraiser in the Last Year: Never true  . Ran Out of Food in the Last Year: Never true  Transportation Needs: No Transportation Needs  . Lack of Transportation (Medical): No  . Lack of Transportation (Non-Medical): No  Physical Activity: Inactive  . Days of Exercise per Week: 0 days  . Minutes of Exercise per Session: 0 min  Stress: No Stress Concern Present  . Feeling of Stress : Only a little  Social Connections: Moderately Isolated  . Frequency of Communication with Friends and Family: More than three times a week  . Frequency of Social Gatherings with Friends and Family: Three times a week  . Attends Religious Services: More than 4 times per year  . Active Member of Clubs or Organizations: No  . Attends Archivist Meetings: Never  . Marital Status: Divorced    Tobacco Counseling Counseling given: Not Answered   Clinical Intake:  Pre-visit preparation  completed: Yes  Pain : No/denies pain Pain Score: 0-No pain     Nutritional Risks: None Diabetes: No  How often do you need to have someone help you when you read instructions, pamphlets, or other written materials from your doctor or pharmacy?: 1 - Never  Interpreter Needed?: No  Information entered by :: [facility], LPN   Activities of Daily Living In your present state of health, do you have any difficulty performing the following activities: 11/26/2019  Hearing? N  Vision? N  Difficulty concentrating or making decisions? N  Walking or climbing stairs? N  Dressing or bathing? N  Doing errands, shopping? N  Preparing Food and eating ? N  Using the Toilet? N  In the past six months, have you accidently leaked urine? N  Do you  have problems with loss of bowel control? N  Managing your Medications? N  Managing your Finances? N  Housekeeping or managing your Housekeeping? N  Some recent data might be hidden     Immunizations and Health Maintenance Immunization History  Administered Date(s) Administered  . Influenza Split 04/27/2006, 06/06/2011  . Influenza, High Dose Seasonal PF 03/17/2018, 03/12/2019  . Influenza,inj,Quad PF,6+ Mos 03/11/2017  . Influenza-Unspecified 06/16/2015  . Moderna SARS-COVID-2 Vaccination 07/30/2019, 08/27/2019  . Pneumococcal Polysaccharide-23 03/26/2019  . Tdap 12/23/2013  . Zoster 01/20/2014   Health Maintenance Due  Topic Date Due  . DEXA SCAN  04/30/2007    Patient Care Team: Jerrol Banana., MD as PCP - General (Family Medicine) Rubie Maid, MD as Referring Physician (Obstetrics and Gynecology)  Indicate any recent Medical Services you may have received from other than [facility] providers in the past year (date may be approximate).     Assessment:   This is a routine wellness examination for [place].  Hearing/Vision screen No exam data present  Dietary issues and exercise activities discussed: Current Exercise Habits:  The patient does not participate in regular exercise at present, Exercise limited by: None identified  Goals    . Exercise 150 min/wk Moderate Activity     Recommend to start exercising 3 days a week for at least 30 minutes at a time.       Depression Screen PHQ 2/9 Scores 11/26/2019 11/20/2018 10/24/2018 08/06/2018 09/10/2017 03/11/2017 03/22/2016  PHQ - 2 Score 0 0 0 0 0 0 0  PHQ- 9 Score - - - - 1 2 -    Fall Risk Fall Risk  11/26/2019 11/20/2018 08/06/2018 09/10/2017 03/22/2016  Falls in the past year? 0 0 0 Yes No  Number falls in past yr: 0 - - 1 -  Injury with Fall? 0 0 - Yes -  Follow up - Falls evaluation completed - - -   FALL RISK PREVENTION PERTAINING TO THE HOME:  Any stairs in or around the home? Yes  If so, are there any without handrails? No   Home free of loose throw rugs in walkways, pet beds, electrical cords, etc? Yes  Adequate lighting in your home to reduce risk of falls? Yes   ASSISTIVE DEVICES UTILIZED TO PREVENT FALLS:  Life alert? No  Use of a cane, walker or w/c? No  Grab bars in the bathroom? No  Shower chair or bench in shower? No  Elevated toilet seat or a handicapped toilet? No    TIMED UP AND GO:  Was the test performed? No .     Cognitive Function:     6CIT Screen 11/26/2019  What Year? 0 points  What month? 0 points  What time? 0 points  Count back from 20 0 points  Months in reverse 0 points  Repeat phrase 0 points  Total Score 0    Screening Tests Health Maintenance  Topic Date Due  . DEXA SCAN  04/30/2007  . INFLUENZA VACCINE  01/17/2020  . PNA vac Low Risk Adult (2 of 2 - PCV13) 03/25/2020  . MAMMOGRAM  07/08/2021  . TETANUS/TDAP  12/24/2023  . COLONOSCOPY  02/05/2024  . COVID-19 Vaccine  Completed  . Hepatitis C Screening  Completed    Qualifies for Shingles Vaccine? Yes  Zostavax completed 01/20/14. Due for Shingrix. Pt has been advised to call insurance company to determine out of pocket expense. Advised may also receive  vaccine at local pharmacy or Health Dept. Verbalized acceptance  and understanding.  Tdap: Up to date  Flu Vaccine: Up to date  Pneumococcal Vaccine: Due for Pneumococcal 23 vaccine 03/2020.   Cancer Screenings:  Colorectal Screening: Completed 02/04/14. Repeat every 10 years.   Mammogram: Completed 07/16/19. Repeat every 1-2 years as advised.   Bone Density: Completed 04/29/02. Results reflect OSTEOPENIA. Repeat every 5 years. Ordered 03/26/19. Pt was unable to schedule apt. Pt would like to this completed at [facility]. Information given for pt to call and set up apt.   Lung Cancer Screening: (Low Dose CT Chest recommended if Age 34-80 years, 30 pack-year currently smoking OR have quit w/in 15years.) does not qualify.   Additional Screening:  Hepatitis C Screening: Up to date  Vision Screening: Recommended annual ophthalmology exams for early detection of glaucoma and other disorders of the eye.  Dental Screening: Recommended annual dental exams for proper oral hygiene  Community Resource Referral:  CRR required this visit? No     Plan:  I have personally reviewed and addressed the Medicare Annual Wellness questionnaire and have noted the following in the patient's chart:  A. Medical and social history B. Use of alcohol, tobacco or illicit drugs  C. Current medications and supplements D. Functional ability and status E.  Nutritional status F.  Physical activity G. Advance directives H. List of other physicians I.  Hospitalizations, surgeries, and ER visits in previous 12 months J.  [place] such as hearing and vision if needed, cognitive and depression L. Referrals and appointments   In addition, I have reviewed and discussed with patient certain preventive protocols, quality metrics, and best practice recommendations. A written personalized care plan for preventive services as well as general preventive health recommendations were provided to  patient.   [place]ora Score, [place]   [id number]  Nurse Health Advisor   Nurse Notes: Pt to call [facility] to set up DEXA scan. Pt was unable to schedule when previously contacted by office.

## 2019-11-26 ENCOUNTER — Ambulatory Visit (INDEPENDENT_AMBULATORY_CARE_PROVIDER_SITE_OTHER): Payer: Medicare HMO

## 2019-11-26 ENCOUNTER — Other Ambulatory Visit: Payer: Self-pay

## 2019-11-26 DIAGNOSIS — Z0000 Encounter for general adult medical examination without abnormal findings: Secondary | ICD-10-CM

## 2019-11-26 NOTE — Patient Instructions (Signed)
Jocelyn Harris , Thank you for taking time to come for your Medicare Wellness Visit. I appreciate your ongoing commitment to your health goals. Please review the following plan we discussed and let me know if I can assist you in the future.   Screening recommendations/referrals: Colonoscopy: Up to date, due 01/2024 Mammogram: Up to date, due 06/2021 Bone Density: Currently due. Order on file. Contact information given to call and schedule apt.  Recommended yearly ophthalmology/optometry visit for glaucoma screening and checkup Recommended yearly dental visit for hygiene and checkup  Vaccinations: Influenza vaccine: Up to date Pneumococcal vaccine: Up to date, Prevnar 46 due 03/2020 Tdap vaccine: Up to date, due 12/2023 Shingles vaccine: Pt declines today.     Advanced directives: Advance directive discussed with you today. Even though you declined this today please call our office should you change your mind and we can give you the proper paperwork for you to fill out.  Conditions/risks identified: Recommend to start exercising 3 days a week for at least 30 minutes at a time.   Next appointment: 01/04/20 @ 10:20 AM with Dr Rosanna Randy    Preventive Care 39 Years and Older, Female Preventive care refers to lifestyle choices and visits with your health care provider that can promote health and wellness. What does preventive care include?  A yearly physical exam. This is also called an annual well check.  Dental exams once or twice a year.  Routine eye exams. Ask your health care provider how often you should have your eyes checked.  Personal lifestyle choices, including:  Daily care of your teeth and gums.  Regular physical activity.  Eating a healthy diet.  Avoiding tobacco and drug use.  Limiting alcohol use.  Practicing safe sex.  Taking low-dose aspirin every day.  Taking vitamin and mineral supplements as recommended by your health care provider. What happens during an  annual well check? The services and screenings done by your health care provider during your annual well check will depend on your age, overall health, lifestyle risk factors, and family history of disease. Counseling  Your health care provider may ask you questions about your:  Alcohol use.  Tobacco use.  Drug use.  Emotional well-being.  Home and relationship well-being.  Sexual activity.  Eating habits.  History of falls.  Memory and ability to understand (cognition).  Work and work Statistician.  Reproductive health. Screening  You may have the following tests or measurements:  Height, weight, and BMI.  Blood pressure.  Lipid and cholesterol levels. These may be checked every 5 years, or more frequently if you are over 34 years old.  Skin check.  Lung cancer screening. You may have this screening every year starting at age 32 if you have a 30-pack-year history of smoking and currently smoke or have quit within the past 15 years.  Fecal occult blood test (FOBT) of the stool. You may have this test every year starting at age 78.  Flexible sigmoidoscopy or colonoscopy. You may have a sigmoidoscopy every 5 years or a colonoscopy every 10 years starting at age 16.  Hepatitis C blood test.  Hepatitis B blood test.  Sexually transmitted disease (STD) testing.  Diabetes screening. This is done by checking your blood sugar (glucose) after you have not eaten for a while (fasting). You may have this done every 1-3 years.  Bone density scan. This is done to screen for osteoporosis. You may have this done starting at age 24.  Mammogram. This may be done  every 1-2 years. Talk to your health care provider about how often you should have regular mammograms. Talk with your health care provider about your test results, treatment options, and if necessary, the need for more tests. Vaccines  Your health care provider may recommend certain vaccines, such as:  Influenza  vaccine. This is recommended every year.  Tetanus, diphtheria, and acellular pertussis (Tdap, Td) vaccine. You may need a Td booster every 10 years.  Zoster vaccine. You may need this after age 70.  Pneumococcal 13-valent conjugate (PCV13) vaccine. One dose is recommended after age 53.  Pneumococcal polysaccharide (PPSV23) vaccine. One dose is recommended after age 38. Talk to your health care provider about which screenings and vaccines you need and how often you need them. This information is not intended to replace advice given to you by your health care provider. Make sure you discuss any questions you have with your health care provider. Document Released: 07/01/2015 Document Revised: 02/22/2016 Document Reviewed: 04/05/2015 Elsevier Interactive Patient Education  2017 [place] Prevention in the Home Falls can cause injuries. They can happen to people of all ages. There are many things you can do to make your home safe and to help prevent falls. What can I do on the outside of my home?  Regularly fix the edges of walkways and driveways and fix any cracks.  Remove anything that might make you trip as you walk through a door, such as a raised step or threshold.  Trim any bushes or trees on the path to your home.  Use bright outdoor lighting.  Clear any walking paths of anything that might make someone trip, such as rocks or tools.  Regularly check to see if handrails are loose or broken. Make sure that both sides of any steps have handrails.  Any raised decks and porches should have guardrails on the edges.  Have any leaves, snow, or ice cleared regularly.  Use sand or salt on walking paths during winter.  Clean up any spills in your garage right away. This includes oil or grease spills. What can I do in the bathroom?  Use night lights.  Install grab bars by the toilet and in the tub and shower. Do not use towel bars as grab bars.  Use non-skid mats or decals  in the tub or shower.  If you need to sit down in the shower, use a plastic, non-slip stool.  Keep the floor dry. Clean up any water that spills on the floor as soon as it happens.  Remove soap buildup in the tub or shower regularly.  Attach bath mats securely with double-sided non-slip rug tape.  Do not have throw rugs and other things on the floor that can make you trip. What can I do in the bedroom?  Use night lights.  Make sure that you have a light by your bed that is easy to reach.  Do not use any sheets or blankets that are too big for your bed. They should not hang down onto the floor.  Have a firm chair that has side arms. You can use this for support while you get dressed.  Do not have throw rugs and other things on the floor that can make you trip. What can I do in the kitchen?  Clean up any spills right away.  Avoid walking on wet floors.  Keep items that you use a lot in easy-to-reach places.  If you need to reach something above you, use a  strong step stool that has a grab bar.  Keep electrical cords out of the way.  Do not use floor polish or wax that makes floors slippery. If you must use wax, use non-skid floor wax.  Do not have throw rugs and other things on the floor that can make you trip. What can I do with my stairs?  Do not leave any items on the stairs.  Make sure that there are handrails on both sides of the stairs and use them. Fix handrails that are broken or loose. Make sure that handrails are as long as the stairways.  Check any carpeting to make sure that it is firmly attached to the stairs. Fix any carpet that is loose or worn.  Avoid having throw rugs at the top or bottom of the stairs. If you do have throw rugs, attach them to the floor with carpet tape.  Make sure that you have a light switch at the top of the stairs and the bottom of the stairs. If you do not have them, ask someone to add them for you. What else can I do to help  prevent falls?  Wear shoes that:  Do not have high heels.  Have rubber bottoms.  Are comfortable and fit you well.  Are closed at the toe. Do not wear sandals.  If you use a stepladder:  Make sure that it is fully opened. Do not climb a closed stepladder.  Make sure that both sides of the stepladder are locked into place.  Ask someone to hold it for you, if possible.  Clearly mark and make sure that you can see:  Any grab bars or handrails.  First and last steps.  Where the edge of each step is.  Use tools that help you move around (mobility aids) if they are needed. These include:  Canes.  Walkers.  Scooters.  Crutches.  Turn on the lights when you go into a dark area. Replace any light bulbs as soon as they burn out.  Set up your furniture so you have a clear path. Avoid moving your furniture around.  If any of your floors are uneven, fix them.  If there are any pets around you, be aware of where they are.  Review your medicines with your doctor. Some medicines can make you feel dizzy. This can increase your chance of falling. Ask your doctor what other things that you can do to help prevent falls. This information is not intended to replace advice given to you by your health care provider. Make sure you discuss any questions you have with your health care provider. Document Released: 03/31/2009 Document Revised: 11/10/2015 Document Reviewed: 07/09/2014 Elsevier Interactive Patient Education  2017 [employer].

## 2019-12-07 ENCOUNTER — Other Ambulatory Visit: Payer: Self-pay | Admitting: Family Medicine

## 2019-12-07 DIAGNOSIS — K219 Gastro-esophageal reflux disease without esophagitis: Secondary | ICD-10-CM

## 2019-12-07 NOTE — Telephone Encounter (Signed)
Requested Prescriptions  Pending Prescriptions Disp Refills   omeprazole (PRILOSEC) 20 MG capsule [Pharmacy Med Name: OMEPRAZOLE 20MG  CAPSULES] 180 capsule 3    Sig: TAKE ONE CAPSULE BY MOUTH TWICE DAILY BEFORE MEALS     Gastroenterology: Proton Pump Inhibitors Passed - 12/07/2019 10:34 AM      Passed - Valid encounter within last 12 months    Recent Outpatient Visits          1 month ago Hypertension, unspecified type   [facility] Jerrol Banana., MD   2 months ago Acute left ankle pain   [place], [place], [place]   4 months ago Sciatica of left side   [facility] Jerrol Banana., MD   6 months ago Upper respiratory tract infection, unspecified type   [facility], Dionne Bucy, MD   6 months ago Respiratory tract congestion with cough   [facility] Jerrol Banana., MD      Future Appointments            In 4 weeks Jerrol Banana., MD [facility], [place]

## 2019-12-28 ENCOUNTER — Other Ambulatory Visit: Payer: Self-pay | Admitting: Family Medicine

## 2019-12-28 DIAGNOSIS — J3089 Other allergic rhinitis: Secondary | ICD-10-CM

## 2020-01-01 NOTE — Progress Notes (Signed)
Established patient visit  I,[name],acting as a scribe for Wilhemena Durie, MD.,have documented all relevant documentation on the behalf of Wilhemena Durie, MD,as directed by  Wilhemena Durie, MD while in the presence of Wilhemena Durie, MD.   Patient: Jocelyn Harris   DOB: [birth year]   69 y.o. Female  MRN: [id number] Visit Date: 01/04/2020  Today's healthcare provider: Wilhemena Durie, MD   Chief Complaint  Patient presents with  . Follow-up  . Hypertension   Subjective    HPI  Overall patient is feeling fairly well.  She has chronic complaints of chronic anxiety.  She complains of leg pain that is consistent with neuropathy but also with possible restless leg syndrome as she has trouble keeping them still, seems to bother her at night.  She is anxious and that her longtime boyfriend has pancreatic cancer. Hypertension, follow-up  BP Readings from Last 3 Encounters:  01/04/20 (!) 150/77  10/15/19 135/73  09/24/19 130/70   Wt Readings from Last 3 Encounters:  01/04/20 127 lb (57.6 kg)  10/15/19 129 lb (58.5 kg)  09/24/19 127 lb 9.6 oz (57.9 kg)     She was last seen for hypertension 3 months ago.  BP at that visit was 135/79. Management since that visit includes stop amlodipine, work on diet and exercise.  She reports good compliance with treatment. She is not having side effects. none She is following a Regular diet. She is not exercising. She does not smoke.  Use of agents associated with hypertension: none.   Outside blood pressures are not checking. --------------------------------------------       Medications: Outpatient Medications Prior to Visit  Medication Sig  . busPIRone (BUSPAR) 10 MG tablet Take 1 tablet (10 mg total) by mouth 3 (three) times daily.  . calcium gluconate 500 MG tablet Take 1 tablet by mouth daily.   . cetirizine (ZYRTEC) 10 MG tablet TAKE 1 TABLET BY MOUTH DAILY  . Cholecalciferol 1000 UNITS capsule Take  1,000 Units by mouth daily.   Marland Kitchen conjugated estrogens (PREMARIN) vaginal cream Place 1 Applicatorful vaginally 3 (three) times a week. Use 0.5 mg vaginally 2-3 times weekly  . fluticasone (FLONASE) 50 MCG/ACT nasal spray USE 2 SPRAYS IN EACH NOSTRIL DAILY  . ibuprofen (ADVIL) 800 MG tablet TAKE 1 TABLET BY MOUTH TWICE DAILY AS NEEDED  . lisinopril (ZESTRIL) 40 MG tablet Take 1 tablet (40 mg total) by mouth daily.  Marland Kitchen Lysine 500 MG TABS Take by mouth as needed.   . montelukast (SINGULAIR) 10 MG tablet TAKE 1 TABLET BY MOUTH AT BEDTIME  . MULTIPLE VITAMINS PO Take by mouth daily.   . Omega-3 Fatty Acids (FISH OIL) 1000 MG CAPS Take by mouth daily.   Marland Kitchen omeprazole (PRILOSEC) 20 MG capsule TAKE ONE CAPSULE BY MOUTH TWICE DAILY BEFORE MEALS  . psyllium (REGULOID) 0.52 g capsule Take 0.52 g by mouth daily.   . sucralfate (CARAFATE) 1 g tablet Take 1 tablet (1 g total) by mouth 4 (four) times daily -  with meals and at bedtime.  . traZODone (DESYREL) 150 MG tablet Take 1 tablet (150 mg total) by mouth at bedtime.  . vitamin E 100 UNIT capsule Take 100 Units by mouth daily.   Marland Kitchen amLODipine (NORVASC) 5 MG tablet Take 1 tablet (5 mg total) by mouth daily. (Patient not taking: Reported on 01/04/2020)  . meloxicam (MOBIC) 7.5 MG tablet Take 1 tablet (7.5 mg total) by mouth daily. (Patient not  taking: Reported on 11/26/2019)   No facility-administered medications prior to visit.    Review of Systems  Constitutional: Negative for appetite change, chills, fatigue and fever.  HENT: Negative.   Respiratory: Negative for chest tightness and shortness of breath.   Cardiovascular: Negative for chest pain and palpitations.  Gastrointestinal: Negative for abdominal pain, nausea and vomiting.  Endocrine: Negative.   Musculoskeletal: Positive for back pain.  Skin: Negative.   Neurological: Negative for dizziness and weakness.  Psychiatric/Behavioral: The patient is nervous/anxious.        Objective    BP (!)  150/77 (BP Location: Right Arm, Patient Position: Sitting, Cuff Size: Large)   Pulse 66   Temp (!) 96.8 F (36 C) (Other (Comment))   Resp 16   Ht 5\' 1"  (1.549 m)   Wt 127 lb (57.6 kg)   SpO2 97%   BMI 24.00 kg/m  BP Readings from Last 3 Encounters:  01/04/20 (!) 150/77  10/15/19 135/73  09/24/19 130/70   Wt Readings from Last 3 Encounters:  01/04/20 127 lb (57.6 kg)  10/15/19 129 lb (58.5 kg)  09/24/19 127 lb 9.6 oz (57.9 kg)      Physical Exam Vitals reviewed.  Constitutional:      Appearance: She is well-developed.  HENT:     Head: Normocephalic and atraumatic.     Right Ear: External ear normal.     Left Ear: External ear normal.     Nose: Nose normal.  Eyes:     Conjunctiva/sclera: Conjunctivae normal.     Pupils: Pupils are equal, round, and reactive to light.  Cardiovascular:     Rate and Rhythm: Normal rate and regular rhythm.     Heart sounds: Normal heart sounds.  Pulmonary:     Effort: Pulmonary effort is normal.     Breath sounds: Normal breath sounds.  Abdominal:     General: Bowel sounds are normal.     Palpations: Abdomen is soft.     Tenderness: There is no abdominal tenderness.  Genitourinary:    Rectum: Internal hemorrhoid present.  Musculoskeletal:     Cervical back: Normal range of motion and neck supple.     Right lower leg: No edema.     Left lower leg: No edema.  Skin:    General: Skin is warm and dry.  Neurological:     General: No focal deficit present.     Mental Status: She is alert and oriented to person, place, and time.  Psychiatric:        Mood and Affect: Mood normal.        Behavior: Behavior normal.        Thought Content: Thought content normal.        Judgment: Judgment normal.       No results found for any visits on 01/04/20.  Assessment & Plan     1. Hypertension, unspecified type Blood pressure controlled on lisinopril and amlodipine. - CBC w/Diff/Platelet - Comprehensive Metabolic Panel (CMET) - Lipid  panel - TSH - Vitamin B12 - Fe+TIBC+Fer  2. Anxiety Chronic issue. Worsened by boyfriend of 12 years with recent pancreatic cancer - CBC w/Diff/Platelet - Comprehensive Metabolic Panel (CMET) - Lipid panel - TSH - Vitamin B12 - Fe+TIBC+Fer  3. Sciatica of left side Normal exam today. - CBC w/Diff/Platelet - Comprehensive Metabolic Panel (CMET) - Lipid panel - TSH - Vitamin B12 - Fe+TIBC+Fer  4. Neuropathy Patient has symptoms of neuropathy. Obtain B12 TSH,. - CBC w/Diff/Platelet -  Comprehensive Metabolic Panel (CMET) - Lipid panel - TSH - Vitamin B12 - Fe+TIBC+Fer  5. Hyperlipidemia, unspecified hyperlipidemia type  - CBC w/Diff/Platelet - Comprehensive Metabolic Panel (CMET) - Lipid panel - TSH - Vitamin B12 - Fe+TIBC+Fer  6. Restless legs syndrome (RLS) Check iron level. Consider treating with ropinirole. - CBC w/Diff/Platelet - Comprehensive Metabolic Panel (CMET) - Lipid panel - TSH - Vitamin B12 - Fe+TIBC+Fer  7. Estrogen deficiency Obtain bone density for baseline - DG Bone density [place]; Future - DG Bone density [place]   No follow-ups on file.         Nyquan Selbe Cranford Mon, MD  [facility] [phone] (phone) [phone] (fax)  [place]

## 2020-01-04 ENCOUNTER — Other Ambulatory Visit: Payer: Self-pay

## 2020-01-04 ENCOUNTER — Encounter: Payer: Self-pay | Admitting: Family Medicine

## 2020-01-04 ENCOUNTER — Ambulatory Visit (INDEPENDENT_AMBULATORY_CARE_PROVIDER_SITE_OTHER): Payer: Medicare HMO | Admitting: Family Medicine

## 2020-01-04 VITALS — BP 150/77 | HR 66 | Temp 96.8°F | Resp 16 | Ht 61.0 in | Wt 127.0 lb

## 2020-01-04 DIAGNOSIS — F419 Anxiety disorder, unspecified: Secondary | ICD-10-CM

## 2020-01-04 DIAGNOSIS — E2839 Other primary ovarian failure: Secondary | ICD-10-CM

## 2020-01-04 DIAGNOSIS — E785 Hyperlipidemia, unspecified: Secondary | ICD-10-CM | POA: Diagnosis not present

## 2020-01-04 DIAGNOSIS — G629 Polyneuropathy, unspecified: Secondary | ICD-10-CM | POA: Diagnosis not present

## 2020-01-04 DIAGNOSIS — I10 Essential (primary) hypertension: Secondary | ICD-10-CM

## 2020-01-04 DIAGNOSIS — M5432 Sciatica, left side: Secondary | ICD-10-CM | POA: Diagnosis not present

## 2020-01-04 DIAGNOSIS — G2581 Restless legs syndrome: Secondary | ICD-10-CM | POA: Diagnosis not present

## 2020-01-28 DIAGNOSIS — M5432 Sciatica, left side: Secondary | ICD-10-CM | POA: Diagnosis not present

## 2020-01-28 DIAGNOSIS — I10 Essential (primary) hypertension: Secondary | ICD-10-CM | POA: Diagnosis not present

## 2020-01-28 DIAGNOSIS — G2581 Restless legs syndrome: Secondary | ICD-10-CM | POA: Diagnosis not present

## 2020-01-28 DIAGNOSIS — F419 Anxiety disorder, unspecified: Secondary | ICD-10-CM | POA: Diagnosis not present

## 2020-01-28 DIAGNOSIS — G629 Polyneuropathy, unspecified: Secondary | ICD-10-CM | POA: Diagnosis not present

## 2020-01-28 DIAGNOSIS — E785 Hyperlipidemia, unspecified: Secondary | ICD-10-CM | POA: Diagnosis not present

## 2020-01-29 LAB — IRON,TIBC AND FERRITIN PANEL
Ferritin: 15 ng/mL (ref 15–150)
Iron Saturation: 18 % (ref 15–55)
Iron: 64 ug/dL (ref 27–139)
Total Iron Binding Capacity: 346 ug/dL (ref 250–450)
UIBC: 282 ug/dL (ref 118–369)

## 2020-01-29 LAB — COMPREHENSIVE METABOLIC PANEL
ALT: 12 IU/L (ref 0–32)
AST: 14 IU/L (ref 0–40)
Albumin/Globulin Ratio: 2 (ref 1.2–2.2)
Albumin: 4.3 g/dL (ref 3.8–4.8)
Alkaline Phosphatase: 85 IU/L (ref 48–121)
BUN/Creatinine Ratio: 31 — ABNORMAL HIGH (ref 12–28)
BUN: 24 mg/dL (ref 8–27)
Bilirubin Total: 0.4 mg/dL (ref 0.0–1.2)
CO2: 24 mmol/L (ref 20–29)
Calcium: 9.3 mg/dL (ref 8.7–10.3)
Chloride: 104 mmol/L (ref 96–106)
Creatinine, Ser: 0.77 mg/dL (ref 0.57–1.00)
GFR calc Af Amer: 92 mL/min/{1.73_m2} (ref >59)
GFR calc non Af Amer: 80 mL/min/{1.73_m2} (ref >59)
Globulin, Total: 2.2 g/dL (ref 1.5–4.5)
Glucose: 87 mg/dL (ref 65–99)
Potassium: 4 mmol/L (ref 3.5–5.2)
Sodium: 142 mmol/L (ref 134–144)
Total Protein: 6.5 g/dL (ref 6.0–8.5)

## 2020-01-29 LAB — CBC WITH DIFFERENTIAL/PLATELET
Basophils Absolute: 0.1 10*3/uL (ref 0.0–0.2)
Basos: 1 %
EOS (ABSOLUTE): 0.2 10*3/uL (ref 0.0–0.4)
Eos: 3 %
Hematocrit: 37.1 % (ref 34.0–46.6)
Hemoglobin: 11.2 g/dL (ref 11.1–15.9)
Immature Grans (Abs): 0 10*3/uL (ref 0.0–0.1)
Immature Granulocytes: 0 %
Lymphocytes Absolute: 2.6 10*3/uL (ref 0.7–3.1)
Lymphs: 39 %
MCH: 25.7 pg — ABNORMAL LOW (ref 26.6–33.0)
MCHC: 30.2 g/dL — ABNORMAL LOW (ref 31.5–35.7)
MCV: 85 fL (ref 79–97)
Monocytes Absolute: 0.5 10*3/uL (ref 0.1–0.9)
Monocytes: 7 %
Neutrophils Absolute: 3.3 10*3/uL (ref 1.4–7.0)
Neutrophils: 50 %
Platelets: 241 10*3/uL (ref 150–450)
RBC: 4.35 x10E6/uL (ref 3.77–5.28)
RDW: 13.1 % (ref 11.7–15.4)
WBC: 6.7 10*3/uL (ref 3.4–10.8)

## 2020-01-29 LAB — LIPID PANEL
Chol/HDL Ratio: 4.1 ratio (ref 0.0–4.4)
Cholesterol, Total: 235 mg/dL — ABNORMAL HIGH (ref 100–199)
HDL: 58 mg/dL (ref >39)
LDL Chol Calc (NIH): 148 mg/dL — ABNORMAL HIGH (ref 0–99)
Triglycerides: 161 mg/dL — ABNORMAL HIGH (ref 0–149)
VLDL Cholesterol Cal: 29 mg/dL (ref 5–40)

## 2020-01-29 LAB — TSH: TSH: 3.16 u[IU]/mL (ref 0.450–4.500)

## 2020-01-29 LAB — VITAMIN B12: Vitamin B-12: 1207 pg/mL (ref 232–1245)

## 2020-02-04 ENCOUNTER — Other Ambulatory Visit: Payer: Self-pay | Admitting: Family Medicine

## 2020-02-04 DIAGNOSIS — K219 Gastro-esophageal reflux disease without esophagitis: Secondary | ICD-10-CM

## 2020-02-18 ENCOUNTER — Other Ambulatory Visit: Payer: Self-pay | Admitting: Family Medicine

## 2020-02-29 ENCOUNTER — Other Ambulatory Visit: Payer: Self-pay | Admitting: Family Medicine

## 2020-03-07 DIAGNOSIS — D2262 Melanocytic nevi of left upper limb, including shoulder: Secondary | ICD-10-CM | POA: Diagnosis not present

## 2020-03-07 DIAGNOSIS — D225 Melanocytic nevi of trunk: Secondary | ICD-10-CM | POA: Diagnosis not present

## 2020-03-07 DIAGNOSIS — D2271 Melanocytic nevi of right lower limb, including hip: Secondary | ICD-10-CM | POA: Diagnosis not present

## 2020-03-07 DIAGNOSIS — D2261 Melanocytic nevi of right upper limb, including shoulder: Secondary | ICD-10-CM | POA: Diagnosis not present

## 2020-03-07 DIAGNOSIS — D2272 Melanocytic nevi of left lower limb, including hip: Secondary | ICD-10-CM | POA: Diagnosis not present

## 2020-03-07 DIAGNOSIS — L821 Other seborrheic keratosis: Secondary | ICD-10-CM | POA: Diagnosis not present

## 2020-03-07 NOTE — Progress Notes (Signed)
Trena Platt Esau Fridman,acting as a scribe for Wilhemena Durie, MD.,have documented all relevant documentation on the behalf of Wilhemena Durie, MD,as directed by  Wilhemena Durie, MD while in the presence of Wilhemena Durie, MD.  Established patient visit   Patient: Jocelyn Harris   DOB: 1951/05/02   69 y.o. Female  MRN: [id number] Visit Date: 03/08/2020  Today's healthcare provider: Wilhemena Durie, MD   Chief Complaint  Patient presents with  . Hypertension  . Anxiety   Subjective    HPI  Patient complains of chronic anxiety with her boyfriend with metastatic cancer.  She has chronic insomnia.  She also has mild pedal and ankle edema along with mildly elevated blood pressure. Hypertension, follow-up  BP Readings from Last 3 Encounters:  01/04/20 (!) 150/77  10/15/19 135/73  09/24/19 130/70   Wt Readings from Last 3 Encounters:  01/04/20 127 lb (57.6 kg)  10/15/19 129 lb (58.5 kg)  09/24/19 127 lb 9.6 oz (57.9 kg)     She was last seen for hypertension 2 months ago.  BP at that visit was 150/77. Management since that visit includes; Blood pressure controlled on lisinopril and amlodipine. She reports excellent compliance with treatment. She is not having side effects.  She is not exercising. She is adherent to low salt diet.   Outside blood pressures are not being checked.  She does not smoke.  Use of agents associated with hypertension: none.   ---------------------------------------------------------------------------------------------------  Anxiety, Follow-up  She was last seen for anxiety 2 months ago. Changes made at last visit include; Chronic issue. Worsened by boyfriend of 12 years with recent pancreatic cancer.   She reports excellent compliance with treatment. She reports good tolerance of treatment. She is not having side effects.   She feels her anxiety is moderate and Unchanged since last visit.  Symptoms: No chest pain No  difficulty concentrating  No dizziness Yes fatigue  No feelings of losing control Yes insomnia  No irritable No palpitations  No panic attacks No racing thoughts  No shortness of breath No sweating  No tremors/shakes    GAD-7 Results No flowsheet data found.  PHQ-9 Scores PHQ9 SCORE ONLY 11/26/2019 11/20/2018 10/24/2018  PHQ-9 Total Score 0 0 0    ---------------------------------------------------------------------------------------------------   Social History   Tobacco Use  . Smoking status: Never Smoker  . Smokeless tobacco: Never Used  Vaping Use  . Vaping Use: Never used  Substance Use Topics  . Alcohol use: No    Alcohol/week: 0.0 standard drinks  . Drug use: No       Medications: Outpatient Medications Prior to Visit  Medication Sig  . amLODipine (NORVASC) 5 MG tablet Take 1 tablet (5 mg total) by mouth daily. (Patient not taking: Reported on 01/04/2020)  . busPIRone (BUSPAR) 10 MG tablet Take 1 tablet (10 mg total) by mouth 3 (three) times daily.  . calcium gluconate 500 MG tablet Take 1 tablet by mouth daily.   . cetirizine (ZYRTEC) 10 MG tablet TAKE 1 TABLET BY MOUTH DAILY  . Cholecalciferol 1000 UNITS capsule Take 1,000 Units by mouth daily.   Marland Kitchen conjugated estrogens (PREMARIN) vaginal cream Place 1 Applicatorful vaginally 3 (three) times a week. Use 0.5 mg vaginally 2-3 times weekly  . fluticasone (FLONASE) 50 MCG/ACT nasal spray USE 2 SPRAYS IN EACH NOSTRIL DAILY  . ibuprofen (ADVIL) 800 MG tablet TAKE 1 TABLET BY MOUTH TWICE DAILY AS NEEDED  . lisinopril (ZESTRIL) 40 MG tablet Take  1 tablet (40 mg total) by mouth daily.  Marland Kitchen Lysine 500 MG TABS Take by mouth as needed.   . meloxicam (MOBIC) 7.5 MG tablet Take 1 tablet (7.5 mg total) by mouth daily. (Patient not taking: Reported on 11/26/2019)  . montelukast (SINGULAIR) 10 MG tablet TAKE 1 TABLET BY MOUTH AT BEDTIME  . MULTIPLE VITAMINS PO Take by mouth daily.   . Omega-3 Fatty Acids (FISH OIL) 1000 MG CAPS Take  by mouth daily.   Marland Kitchen omeprazole (PRILOSEC) 20 MG capsule TAKE ONE CAPSULE BY MOUTH TWICE DAILY BEFORE MEALS  . psyllium (REGULOID) 0.52 g capsule Take 0.52 g by mouth daily.   . sucralfate (CARAFATE) 1 g tablet TAKE 1 TABLET BY MOUTH FOUR TIMES DAILY WITH MEALS AND AT BEDTIME  . traZODone (DESYREL) 150 MG tablet Take 1 tablet (150 mg total) by mouth at bedtime.  . vitamin E 100 UNIT capsule Take 100 Units by mouth daily.    No facility-administered medications prior to visit.    Review of Systems  Constitutional: Negative for appetite change, chills, fatigue and fever.  Respiratory: Negative for chest tightness and shortness of breath.   Cardiovascular: Negative for chest pain and palpitations.  Gastrointestinal: Negative for abdominal pain, nausea and vomiting.  Neurological: Negative for dizziness and weakness.       Objective    There were no vitals taken for this visit. BP Readings from Last 3 Encounters:  03/09/20 (!) 155/82  03/08/20 (!) 156/73  01/04/20 (!) 150/77   Wt Readings from Last 3 Encounters:  03/09/20 125 lb 9.6 oz (57 kg)  03/08/20 126 lb 9.6 oz (57.4 kg)  01/04/20 127 lb (57.6 kg)      Physical Exam Vitals reviewed.  Constitutional:      Appearance: Normal appearance.  HENT:     Head: Normocephalic and atraumatic.     Right Ear: External ear normal.     Left Ear: External ear normal.  Eyes:     General: No scleral icterus.    Conjunctiva/sclera: Conjunctivae normal.  Cardiovascular:     Rate and Rhythm: Normal rate and regular rhythm.     Pulses: Normal pulses.     Heart sounds: Normal heart sounds.  Pulmonary:     Effort: Pulmonary effort is normal.     Breath sounds: Normal breath sounds.  Musculoskeletal:     Right lower leg: No edema.     Left lower leg: No edema.  Skin:    General: Skin is warm and dry.  Neurological:     General: No focal deficit present.     Mental Status: She is alert and oriented to person, place, and time.    Psychiatric:        Mood and Affect: Mood normal.        Behavior: Behavior normal.        Thought Content: Thought content normal.        Judgment: Judgment normal.       No results found for any visits on 03/08/20.  Assessment & Plan     1. Hypertension, unspecified type  - hydrochlorothiazide (HYDRODIURIL) 25 MG tablet; Take 1 tablet (25 mg total) by mouth daily.  Dispense: 30 tablet; Refill: 12  2. Anxiety   3. Need for influenza vaccination  - Flu Vaccine QUAD High Dose(Fluad)  4. Psychophysiological insomnia Consider Belsomra.   No follow-ups on file.      I, Wilhemena Durie, MD, have reviewed all documentation for this  visit. The documentation on 03/12/20 for the exam, diagnosis, procedures, and orders are all accurate and complete.    Richard Cranford Mon, MD  [facility] [phone] (phone) [phone] (fax)  [place]

## 2020-03-08 ENCOUNTER — Ambulatory Visit (INDEPENDENT_AMBULATORY_CARE_PROVIDER_SITE_OTHER): Payer: Medicare HMO | Admitting: Family Medicine

## 2020-03-08 ENCOUNTER — Encounter: Payer: Self-pay | Admitting: Family Medicine

## 2020-03-08 ENCOUNTER — Other Ambulatory Visit: Payer: Self-pay

## 2020-03-08 VITALS — BP 156/73 | HR 70 | Temp 98.2°F | Ht 61.0 in | Wt 126.6 lb

## 2020-03-08 DIAGNOSIS — I10 Essential (primary) hypertension: Secondary | ICD-10-CM

## 2020-03-08 DIAGNOSIS — Z23 Encounter for immunization: Secondary | ICD-10-CM

## 2020-03-08 DIAGNOSIS — F5104 Psychophysiologic insomnia: Secondary | ICD-10-CM

## 2020-03-08 DIAGNOSIS — F419 Anxiety disorder, unspecified: Secondary | ICD-10-CM

## 2020-03-08 MED ORDER — HYDROCHLOROTHIAZIDE 25 MG PO TABS: 25.0000 mg | ORAL_TABLET | Freq: Every day | ORAL | 12 refills | Status: DC

## 2020-03-09 ENCOUNTER — Ambulatory Visit
Admission: RE | Admit: 2020-03-09 | Discharge: 2020-03-09 | Disposition: A | Discharge status: Home / Self Care | Payer: Medicare HMO | Attending: Family Medicine | Admitting: Family Medicine

## 2020-03-09 ENCOUNTER — Other Ambulatory Visit: Payer: Self-pay

## 2020-03-09 ENCOUNTER — Encounter: Payer: Self-pay | Admitting: Family Medicine

## 2020-03-09 ENCOUNTER — Ambulatory Visit
Admission: RE | Admit: 2020-03-09 | Discharge: 2020-03-09 | Disposition: A | Discharge status: Home / Self Care | Payer: Medicare HMO | Source: Ambulatory Visit | Attending: Family Medicine | Admitting: Family Medicine

## 2020-03-09 ENCOUNTER — Ambulatory Visit (INDEPENDENT_AMBULATORY_CARE_PROVIDER_SITE_OTHER): Payer: Medicare HMO | Admitting: Family Medicine

## 2020-03-09 DIAGNOSIS — S060X0A Concussion without loss of consciousness, initial encounter: Secondary | ICD-10-CM

## 2020-03-09 DIAGNOSIS — M542 Cervicalgia: Secondary | ICD-10-CM | POA: Insufficient documentation

## 2020-03-09 DIAGNOSIS — M50323 Other cervical disc degeneration at C6-C7 level: Secondary | ICD-10-CM | POA: Diagnosis not present

## 2020-03-09 DIAGNOSIS — M2578 Osteophyte, vertebrae: Secondary | ICD-10-CM | POA: Diagnosis not present
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
## 2020-03-09 NOTE — Patient Instructions (Addendum)
ICE TODAY AND TOMORROW, THEN HEATING PAD!!!   TAKE FISH OIL, 1 DAILY FOR 1 MONTH!!!

## 2020-03-14 ENCOUNTER — Telehealth: Payer: Self-pay

## 2020-03-14 ENCOUNTER — Ambulatory Visit: Payer: Self-pay | Admitting: *Deleted

## 2020-03-14 NOTE — Telephone Encounter (Signed)
Copied from [place] [phone]. Topic: Appointment Scheduling - Scheduling Inquiry for Clinic >> Mar 14, 2020 10:53 AM Lennox Solders wrote: Reason for CRM: Pt seen dr Rosanna Randy on 03-09-2020 for concussion. Pt needs to go back to work and needs a follow up with dr Rosanna Randy. Dr Rosanna Randy next avail open is 03-21-2020. Pt would like to see dr Rosanna Randy before oct. Pt is still feeling lightheaded since the concussion. Pt will be triaged

## 2020-03-14 NOTE — Telephone Encounter (Signed)
I am not surprised if she is still woozy from her concussion.  As long as she is not have any other serious symptoms then we can keep her out of work if she wants until she sees me next week.  If she is having more serious or worsening symptoms she needs to be seen by be seen by someone else this week

## 2020-03-14 NOTE — Telephone Encounter (Signed)
Spoke with patient on the phone she reports headache and feeling lightheaded, patients sates " im not feeling right in the head, but my symptoms are improving from what they were." Patient is requesting at this time a note to extend her out of work until Monday. Patient strongly encouraged to make office appointment if headache and lightheadedness continues or worsens. Patient states that she will give our office a call back at end of the week to give [place] a update on how she is feeling. Please advise when note is ready for patient to pick up. KW
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
## 2020-03-15 ENCOUNTER — Other Ambulatory Visit: Payer: Self-pay | Admitting: Family Medicine

## 2020-03-15 DIAGNOSIS — I10 Essential (primary) hypertension: Secondary | ICD-10-CM

## 2020-03-16 NOTE — Telephone Encounter (Signed)
Note is ready. Patient was advised.

## 2020-03-24 ENCOUNTER — Other Ambulatory Visit: Payer: Self-pay | Admitting: Family Medicine

## 2020-03-24 DIAGNOSIS — J3089 Other allergic rhinitis: Secondary | ICD-10-CM

## 2020-04-01 ENCOUNTER — Other Ambulatory Visit: Payer: Self-pay | Admitting: Family Medicine
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
## 2020-04-04 ENCOUNTER — Other Ambulatory Visit: Payer: Self-pay | Admitting: Family Medicine

## 2020-04-04 DIAGNOSIS — J3089 Other allergic rhinitis: Secondary | ICD-10-CM

## 2020-04-07 ENCOUNTER — Other Ambulatory Visit: Payer: Self-pay

## 2020-04-07 ENCOUNTER — Ambulatory Visit (INDEPENDENT_AMBULATORY_CARE_PROVIDER_SITE_OTHER): Payer: Medicare HMO | Admitting: Family Medicine

## 2020-04-07 ENCOUNTER — Encounter: Payer: Self-pay | Admitting: Family Medicine

## 2020-04-07 VITALS — BP 134/72 | HR 73 | Temp 98.0°F | Resp 16 | Ht 61.0 in | Wt 126.0 lb

## 2020-04-07 DIAGNOSIS — E785 Hyperlipidemia, unspecified: Secondary | ICD-10-CM

## 2020-04-07 DIAGNOSIS — F3341 Major depressive disorder, recurrent, in partial remission: Secondary | ICD-10-CM | POA: Diagnosis not present

## 2020-04-07 DIAGNOSIS — H6122 Impacted cerumen, left ear: Secondary | ICD-10-CM | POA: Diagnosis not present

## 2020-04-07 DIAGNOSIS — I10 Essential (primary) hypertension: Secondary | ICD-10-CM

## 2020-04-07 DIAGNOSIS — F419 Anxiety disorder, unspecified: Secondary | ICD-10-CM

## 2020-04-07 NOTE — Progress Notes (Signed)
I,[name],acting as a scribe for Wilhemena Durie, MD.,have documented all relevant documentation on the behalf of Wilhemena Durie, MD,as directed by  Wilhemena Durie, MD while in the presence of Wilhemena Durie, MD.   Established patient visit   Patient: Jocelyn Harris   DOB: 05-Jul-1950   69 y.o. Female  MRN: [id number] Visit Date: 04/07/2020  Today's healthcare provider: Wilhemena Durie, MD   Chief Complaint  Patient presents with  . Follow-up  . Hypertension   Subjective    HPI  Complaint of fullness in left ear.  Tolerating blood pressure medication. Hypertension, follow-up  BP Readings from Last 3 Encounters:  04/07/20 134/72  03/09/20 (!) 155/82  03/08/20 (!) 156/73   Wt Readings from Last 3 Encounters:  04/07/20 126 lb (57.2 kg)  03/09/20 125 lb 9.6 oz (57 kg)  03/08/20 126 lb 9.6 oz (57.4 kg)     She was last seen for hypertension 1 months ago.  BP at that visit was 156/73. Management since that visit includes; Blood pressure controlled on lisinopril and amlodipine. She reports good compliance with treatment. She is not having side effects. none She is exercising. She is adherent to low salt diet.   Outside blood pressures are not checking.  She does not smoke.  Use of agents associated with hypertension: none.   --------------------------------------------------------------------       Medications: Outpatient Medications Prior to Visit  Medication Sig  . busPIRone (BUSPAR) 10 MG tablet Take 1 tablet (10 mg total) by mouth 3 (three) times daily.  . calcium gluconate 500 MG tablet Take 1 tablet by mouth daily.   . Cholecalciferol 1000 UNITS capsule Take 1,000 Units by mouth daily.   . fluticasone (FLONASE) 50 MCG/ACT nasal spray SHAKE LIQUID AND USE 2 SPRAYS IN EACH NOSTRIL DAILY  . hydrochlorothiazide (HYDRODIURIL) 25 MG tablet Take 1 tablet (25 mg total) by mouth daily.  Marland Kitchen ibuprofen (ADVIL) 800 MG tablet TAKE 1 TABLET BY  MOUTH TWICE DAILY AS NEEDED  . lisinopril (ZESTRIL) 40 MG tablet TAKE 1 TABLET(40 MG) BY MOUTH DAILY  . Lysine 500 MG TABS Take by mouth as needed.   . montelukast (SINGULAIR) 10 MG tablet TAKE 1 TABLET BY MOUTH AT BEDTIME  . MULTIPLE VITAMINS PO Take by mouth daily.   . Omega-3 Fatty Acids (FISH OIL) 1000 MG CAPS Take by mouth daily.   Marland Kitchen omeprazole (PRILOSEC) 20 MG capsule TAKE ONE CAPSULE BY MOUTH TWICE DAILY BEFORE MEALS  . sucralfate (CARAFATE) 1 g tablet TAKE 1 TABLET BY MOUTH FOUR TIMES DAILY WITH MEALS AND AT BEDTIME  . traZODone (DESYREL) 150 MG tablet Take 1 tablet (150 mg total) by mouth at bedtime.  . vitamin E 100 UNIT capsule Take 100 Units by mouth daily.   Marland Kitchen amLODipine (NORVASC) 5 MG tablet Take 1 tablet (5 mg total) by mouth daily. (Patient not taking: Reported on 04/07/2020)  . cetirizine (ZYRTEC) 10 MG tablet TAKE 1 TABLET BY MOUTH DAILY (Patient not taking: Reported on 04/07/2020)  . conjugated estrogens (PREMARIN) vaginal cream Place 1 Applicatorful vaginally 3 (three) times a week. Use 0.5 mg vaginally 2-3 times weekly (Patient not taking: Reported on 04/07/2020)  . psyllium (REGULOID) 0.52 g capsule Take 0.52 g by mouth daily.  (Patient not taking: Reported on 04/07/2020)   No facility-administered medications prior to visit.    Review of Systems  Constitutional: Negative for appetite change, chills, fatigue and fever.  Respiratory: Negative for chest tightness  and shortness of breath.   Cardiovascular: Negative for chest pain and palpitations.  Gastrointestinal: Negative for abdominal pain, nausea and vomiting.  Neurological: Negative for dizziness and weakness.       Objective    BP 134/72 (BP Location: Right Arm, Patient Position: Sitting, Cuff Size: Normal)   Pulse 73   Temp 98 F (36.7 C) (Oral)   Resp 16   Ht 5\' 1"  (1.549 m)   Wt 126 lb (57.2 kg)   SpO2 98%   BMI 23.81 kg/m  BP Readings from Last 3 Encounters:  04/07/20 134/72  03/09/20 (!) 155/82    03/08/20 (!) 156/73   Wt Readings from Last 3 Encounters:  04/07/20 126 lb (57.2 kg)  03/09/20 125 lb 9.6 oz (57 kg)  03/08/20 126 lb 9.6 oz (57.4 kg)      Physical Exam Vitals reviewed.  Constitutional:      Appearance: Normal appearance.  HENT:     Head: Normocephalic and atraumatic.     Right Ear: Ear canal and external ear normal.     Left Ear: External ear normal.     Ears:     Comments: Left EAC filled with cerumen.  This is irrigated. Eyes:     General: No scleral icterus.    Conjunctiva/sclera: Conjunctivae normal.  Cardiovascular:     Rate and Rhythm: Normal rate and regular rhythm.     Pulses: Normal pulses.     Heart sounds: Normal heart sounds.  Pulmonary:     Effort: Pulmonary effort is normal.     Breath sounds: Normal breath sounds.  Musculoskeletal:     Right lower leg: No edema.     Left lower leg: No edema.  Skin:    General: Skin is warm and dry.  Neurological:     General: No focal deficit present.     Mental Status: She is alert and oriented to person, place, and time.  Psychiatric:        Mood and Affect: Mood normal.        Behavior: Behavior normal.        Thought Content: Thought content normal.        Judgment: Judgment normal.       No results found for any visits on 04/07/20.  Assessment & Plan     1. Excessive cerumen in left ear canal Irrigated  2. Primary hypertension Improving control  3. Hyperlipidemia, unspecified hyperlipidemia type   4. Recurrent major depressive disorder, in partial remission ([place]) Hold all meds  5. Anxiety Fianc of 12 years as stage IV pancreatic cancer.   No follow-ups on file.         Angeleigh Chiasson Cranford Mon, MD  [facility] [phone] (phone) [phone] (fax)  [place]

## 2020-05-07 ENCOUNTER — Other Ambulatory Visit: Payer: Self-pay | Admitting: Family Medicine

## 2020-05-07 DIAGNOSIS — K219 Gastro-esophageal reflux disease without esophagitis: Secondary | ICD-10-CM

## 2020-05-16 DIAGNOSIS — Z20822 Contact with and (suspected) exposure to covid-19: Secondary | ICD-10-CM | POA: Diagnosis not present

## 2020-06-02 ENCOUNTER — Other Ambulatory Visit: Payer: Self-pay | Admitting: Family Medicine

## 2020-06-10 ENCOUNTER — Other Ambulatory Visit: Payer: Self-pay | Admitting: Family Medicine

## 2020-06-10 DIAGNOSIS — F32A Depression, unspecified: Secondary | ICD-10-CM

## 2020-06-13 ENCOUNTER — Other Ambulatory Visit: Payer: Self-pay | Admitting: Family Medicine

## 2020-06-13 DIAGNOSIS — J309 Allergic rhinitis, unspecified: Secondary | ICD-10-CM

## 2020-06-27 ENCOUNTER — Other Ambulatory Visit: Payer: Self-pay | Admitting: Family Medicine

## 2020-06-27 DIAGNOSIS — J3089 Other allergic rhinitis: Secondary | ICD-10-CM

## 2020-06-28 ENCOUNTER — Ambulatory Visit: Payer: Self-pay | Admitting: *Deleted

## 2020-06-28 NOTE — Telephone Encounter (Signed)
Pt called in c/o lower back pain that is radiating down both legs.   "It's a burning stinging feeling".    "I have sciatica so Dr. Rosanna Randy usually gives me something for it". She is requesting to be seen tomorrow if possible.   I let her know I would send a note to Dr. Rosanna Randy and have someone call you back regarding an appt.  She was agreeable to this plan.   I sent my notes to [facility].    Reason for Disposition . [1] MODERATE pain (e.g., interferes with normal activities, limping) AND [2] present > 3 days  Answer Assessment - Initial Assessment Questions 1. ONSET: "When did the pain start?"      This morning when I got up this morning.   I have sciatica.  Dr. Rosanna Randy gives me ibuprofen for it. 2. LOCATION: "Where is the pain located?"      My lower back hurts and the pain shoots down both legs all the time.   I took ibuprofen this morning.   It helped. 3. PAIN: "How bad is the pain?"    (Scale 1-10; or mild, moderate, severe)   -  MILD (1-3): doesn't interfere with normal activities    -  MODERATE (4-7): interferes with normal activities (e.g., work or school) or awakens from sleep, limping    -  SEVERE (8-10): excruciating pain, unable to do any normal activities, unable to walk     Today a 10 4. WORK OR EXERCISE: "Has there been any recent work or exercise that involved this part of the body?"      A few days I was lifting some clothes and I felt a pain in my lower back.   It hurts real bad when I bend over.   5. CAUSE: "What do you think is causing the leg pain?"     Sciatica 6. OTHER SYMPTOMS: "Do you have any other symptoms?" (e.g., chest pain, back pain, breathing difficulty, swelling, rash, fever, numbness, weakness)     Pain in feet also.    I'm burning and stinging. 7. PREGNANCY: "Is there any chance you are pregnant?" "When was your last menstrual period?"     Not asked  Protocols used: LEG PAIN-A-AH

## 2020-06-29 NOTE — Telephone Encounter (Signed)
Appt scheduled

## 2020-06-29 NOTE — Telephone Encounter (Signed)
[id number] 

## 2020-06-30 ENCOUNTER — Other Ambulatory Visit: Payer: Self-pay | Admitting: Family Medicine

## 2020-06-30 ENCOUNTER — Ambulatory Visit: Payer: Medicare HMO | Admitting: Family Medicine

## 2020-06-30 DIAGNOSIS — J3089 Other allergic rhinitis: Secondary | ICD-10-CM

## 2020-07-01 ENCOUNTER — Ambulatory Visit (INDEPENDENT_AMBULATORY_CARE_PROVIDER_SITE_OTHER): Payer: Medicare HMO | Admitting: Family Medicine

## 2020-07-01 ENCOUNTER — Other Ambulatory Visit: Payer: Self-pay

## 2020-07-01 ENCOUNTER — Encounter: Payer: Self-pay | Admitting: Family Medicine

## 2020-07-01 VITALS — BP 140/74 | HR 59 | Temp 98.5°F | Resp 16 | Wt 124.0 lb

## 2020-07-01 DIAGNOSIS — B029 Zoster without complications: Secondary | ICD-10-CM | POA: Diagnosis not present

## 2020-07-01 DIAGNOSIS — M5441 Lumbago with sciatica, right side: Secondary | ICD-10-CM

## 2020-07-01 DIAGNOSIS — M5442 Lumbago with sciatica, left side: Secondary | ICD-10-CM

## 2020-07-01 DIAGNOSIS — Z20822 Contact with and (suspected) exposure to covid-19: Secondary | ICD-10-CM | POA: Diagnosis not present

## 2020-07-01 MED ORDER — VALACYCLOVIR HCL 1 G PO TABS: 1000.0000 mg | ORAL_TABLET | Freq: Three times a day (TID) | ORAL | 0 refills | Status: AC

## 2020-07-01 MED ORDER — GABAPENTIN 300 MG PO CAPS: 300.0000 mg | ORAL_CAPSULE | Freq: Three times a day (TID) | ORAL | 3 refills | Status: DC

## 2020-07-01 NOTE — Progress Notes (Signed)
Acute Office Visit  Subjective:    Patient ID: Jocelyn Harris, female    DOB: 1950/11/11, 70 y.o.   MRN: [id number]  Chief Complaint  Patient presents with  . Back Pain    HPI Patient is in today for back/sciatica pain. Patient reports back pain and sciatica pain on right and left side this week. x4-5 days. Getting a little better.   Rash on R buttocks started this morning. Painful.  Past Medical History:  Diagnosis Date  . GERD (gastroesophageal reflux disease)   . Hypertension     Past Surgical History:  Procedure Laterality Date  . KNEE SURGERY Right   . TOE SURGERY    . TUBAL LIGATION      Family History  Problem Relation Age of Onset  . Diabetes Mother   . Hypertension Mother   . Congestive Heart Failure Mother   . Hypertension Father   . Lung cancer Father   . Fibromyalgia Sister   . Lupus Sister   . Cancer Sister        of blood  . Healthy Daughter     Social History   Socioeconomic History  . Marital status: Divorced    Spouse name: Not on file  . Number of children: 1  . Years of education: Not on file  . Highest education level: Some college, no degree  Occupational History  . Occupation: customer service    Comment: part time  Tobacco Use  . Smoking status: Never Smoker  . Smokeless tobacco: Never Used  Vaping Use  . Vaping Use: Never used  Substance and Sexual Activity  . Alcohol use: No    Alcohol/week: 0.0 standard drinks  . Drug use: No  . Sexual activity: Not Currently    Birth control/protection: None  Other Topics Concern  . Not on file  Social History Narrative  . Not on file   Social Determinants of Health   Financial Resource Strain: Low Risk   . Difficulty of Paying Living Expenses: Not hard at all  Food Insecurity: No Food Insecurity  . Worried About Charity fundraiser in the Last Year: Never true  . Ran Out of Food in the Last Year: Never true  Transportation Needs: No Transportation Needs  . Lack of  Transportation (Medical): No  . Lack of Transportation (Non-Medical): No  Physical Activity: Inactive  . Days of Exercise per Week: 0 days  . Minutes of Exercise per Session: 0 min  Stress: No Stress Concern Present  . Feeling of Stress : Only a little  Social Connections: Moderately Isolated  . Frequency of Communication with Friends and Family: More than three times a week  . Frequency of Social Gatherings with Friends and Family: Three times a week  . Attends Religious Services: More than 4 times per year  . Active Member of Clubs or Organizations: No  . Attends Archivist Meetings: Never  . Marital Status: Divorced  Human resources officer Violence: Not At Risk  . Fear of Current or Ex-Partner: No  . Emotionally Abused: No  . Physically Abused: No  . Sexually Abused: No    Outpatient Medications Prior to Visit  Medication Sig Dispense Refill  . amLODipine (NORVASC) 5 MG tablet Take 1 tablet (5 mg total) by mouth daily. 90 tablet 3  . busPIRone (BUSPAR) 10 MG tablet Take 1 tablet (10 mg total) by mouth 3 (three) times daily. 90 tablet 11  . calcium gluconate 500 MG tablet Take  1 tablet by mouth daily.     . Cholecalciferol 1000 UNITS capsule Take 1,000 Units by mouth daily.     . fluticasone (FLONASE) 50 MCG/ACT nasal spray SHAKE LIQUID AND USE 2 SPRAYS IN EACH NOSTRIL DAILY 16 g 2  . hydrochlorothiazide (HYDRODIURIL) 25 MG tablet Take 1 tablet (25 mg total) by mouth daily. 30 tablet 12  . ibuprofen (ADVIL) 800 MG tablet TAKE 1 TABLET BY MOUTH TWICE DAILY AS NEEDED 60 tablet 2  . lisinopril (ZESTRIL) 40 MG tablet TAKE 1 TABLET(40 MG) BY MOUTH DAILY 90 tablet 3  . Lysine 500 MG TABS Take by mouth as needed.     . montelukast (SINGULAIR) 10 MG tablet TAKE 1 TABLET BY MOUTH AT BEDTIME 90 tablet 0  . MULTIPLE VITAMINS PO Take by mouth daily.     . Omega-3 Fatty Acids (FISH OIL) 1000 MG CAPS Take by mouth daily.     Marland Kitchen omeprazole (PRILOSEC) 20 MG capsule TAKE ONE CAPSULE BY MOUTH  TWICE DAILY BEFORE MEALS 180 capsule 3  . psyllium (REGULOID) 0.52 g capsule Take 0.52 g by mouth daily.    . sucralfate (CARAFATE) 1 g tablet TAKE 1 TABLET BY MOUTH FOUR TIMES DAILY BEFORE A MEAL AND AT BEDTIME 120 tablet 3  . traZODone (DESYREL) 150 MG tablet TAKE 1 TABLET(150 MG) BY MOUTH AT BEDTIME 30 tablet 11  . vitamin E 100 UNIT capsule Take 100 Units by mouth daily.     . cetirizine (ZYRTEC) 10 MG tablet TAKE 1 TABLET BY MOUTH DAILY (Patient not taking: Reported on 04/07/2020) 30 tablet 5  . conjugated estrogens (PREMARIN) vaginal cream Place 1 Applicatorful vaginally 3 (three) times a week. Use 0.5 mg vaginally 2-3 times weekly (Patient not taking: Reported on 04/07/2020) 42.5 g 12   No facility-administered medications prior to visit.    Allergies  Allergen Reactions  . Augmentin [Amoxicillin-Pot Clavulanate] Nausea Only    Patient reports that her reflux was bad when she took medication  . Doxycycline Nausea Only and Nausea And Vomiting    Review of Systems  Constitutional: Positive for activity change and fatigue. Negative for appetite change, fever and unexpected weight change.  Respiratory: Negative.   Cardiovascular: Negative.   Musculoskeletal: Positive for back pain, gait problem and myalgias.  Skin: Positive for color change and rash.  Psychiatric/Behavioral: Negative.        Objective:    Physical Exam Vitals reviewed.  Constitutional:      General: She is not in acute distress.    Appearance: Normal appearance. She is not diaphoretic.  HENT:     Head: Normocephalic.  Eyes:     Conjunctiva/sclera: Conjunctivae normal.  Cardiovascular:     Rate and Rhythm: Normal rate and regular rhythm.  Pulmonary:     Effort: Pulmonary effort is normal. No respiratory distress.     Breath sounds: No wheezing.  Abdominal:     General: There is no distension.     Palpations: Abdomen is soft.  Musculoskeletal:     Comments: Back: No midline TTP, ROM grossly intact.   Mild TTP over R and left lower back/SI joint.  Mild, positive SLR bilaterally.  Strength and sensation to light touch intact in lower extremities.  Skin:    Findings: Rash (Grouped vesicles on erythematous base on right glut) present.  Neurological:     Mental Status: She is alert and oriented to person, place, and time. Mental status is at baseline.     Cranial Nerves:  No cranial nerve deficit.     Sensory: No sensory deficit.     Motor: No weakness.     Gait: Gait normal.     BP 140/74 (BP Location: Left Arm, Patient Position: Sitting, Cuff Size: Normal)   Pulse (!) 59   Temp 98.5 F (36.9 C) (Oral)   Resp 16   Wt 124 lb (56.2 kg)   SpO2 99%   BMI 23.43 kg/m  Wt Readings from Last 3 Encounters:  07/01/20 124 lb (56.2 kg)  04/07/20 126 lb (57.2 kg)  03/09/20 125 lb 9.6 oz (57 kg)    Health Maintenance Due  Topic Date Due  . DEXA SCAN  04/30/2007  . COVID-19 Vaccine (3 - Moderna risk 4-dose series) 09/24/2019  . PNA vac Low Risk Adult (2 of 2 - PCV13) 03/25/2020    There are no preventive care reminders to display for this patient.   Lab Results  Component Value Date   TSH 3.160 01/28/2020   Lab Results  Component Value Date   WBC 6.7 01/28/2020   HGB 11.2 01/28/2020   HCT 37.1 01/28/2020   MCV 85 01/28/2020   PLT 241 01/28/2020   Lab Results  Component Value Date   NA 142 01/28/2020   K 4.0 01/28/2020   CO2 24 01/28/2020   GLUCOSE 87 01/28/2020   BUN 24 01/28/2020   CREATININE 0.77 01/28/2020   BILITOT 0.4 01/28/2020   ALKPHOS 85 01/28/2020   AST 14 01/28/2020   ALT 12 01/28/2020   PROT 6.5 01/28/2020   ALBUMIN 4.3 01/28/2020   CALCIUM 9.3 01/28/2020   Lab Results  Component Value Date   CHOL 235 (H) 01/28/2020   Lab Results  Component Value Date   HDL 58 01/28/2020   Lab Results  Component Value Date   LDLCALC 148 (H) 01/28/2020   Lab Results  Component Value Date   TRIG 161 (H) 01/28/2020   Lab Results  Component Value Date    CHOLHDL 4.1 01/28/2020   No results found for: HGBA1C     Assessment & Plan:   1. Herpes zoster without complication -New problem - Rash is consistent with shingles on right gluteus - Discussed keeping it covered as it passes by contact - Discussed etiology natural course of shingles - Encouraged Shingrix vaccination after it heals - Treat with Valtrex 1 g 3 times daily x7 days - Gabapentin for pain associated with it  2. Acute bilateral low back pain with bilateral sciatica -Recurrent issue - No red flag symptoms, but mild radicular symptoms in both legs - Avoid prednisone given current shingles outbreak - No imaging necessary at this time - Discussed home exercise program - Discussed rest, stretching, heating pad - Gabapentin 3 times daily for symptom management - Return precautions discussed   Return if symptoms worsen or fail to improve.    Total time spent on today's visit was greater than 30 minutes, including both face-to-face time and nonface-to-face time personally spent on review of chart (labs and imaging), discussing labs and goals, discussing further work-up, treatment options, referrals to specialist if needed, reviewing outside records of pertinent, answering patient's questions, and coordinating care.    I, Lavon Paganini, MD, have reviewed all documentation for this visit. The documentation on 07/01/20 for the exam, diagnosis, procedures, and orders are all accurate and complete.   Jocelyn Harris, Dionne Bucy, MD, MPH [facility]

## 2020-07-01 NOTE — Patient Instructions (Signed)
Shingles  Shingles, which is also known as herpes zoster, is an infection that causes a painful skin rash and fluid-filled blisters. It is caused by a virus. Shingles only develops in people who:  Have had chickenpox.  Have been given a medicine to protect against chickenpox (have been vaccinated). Shingles is rare in this group. What are the causes? Shingles is caused by varicella-zoster virus (VZV). This is the same virus that causes chickenpox. After a person is exposed to VZV, the virus stays in the body in an inactive (dormant) state. Shingles develops if the virus is reactivated. This can happen many years after the first (initial) exposure to VZV. It is not known what causes this virus to be reactivated. What increases the risk? People who have had chickenpox or received the chickenpox vaccine are at risk for shingles. Shingles infection is more common in people who:  Are older than age 46.  Have a weakened disease-fighting system (immune system), such as people with: ? HIV. ? AIDS. ? Cancer.  Are taking medicines that weaken the immune system, such as transplant medicines.  Are experiencing a lot of stress. What are the signs or symptoms? Early symptoms of this condition include itching, tingling, and pain in an area on your skin. Pain may be described as burning, stabbing, or throbbing. A few days or weeks after early symptoms start, a painful red rash appears. The rash is usually on one side of the body and has a band-like or belt-like pattern. The rash eventually turns into fluid-filled blisters that break open, change into scabs, and dry up in about 2-3 weeks. At any time during the infection, you may also develop:  A fever.  Chills.  A headache.  An upset stomach. How is this diagnosed? This condition is diagnosed with a skin exam. Skin or fluid samples may be taken from the blisters before a diagnosis is made. These samples are examined under a microscope or sent to  a lab for testing. How is this treated? The rash may last for several weeks. There is not a specific cure for this condition. Your health care provider will probably prescribe medicines to help you manage pain, recover more quickly, and avoid long-term problems. Medicines may include:  Antiviral drugs.  Anti-inflammatory drugs.  Pain medicines.  Anti-itching medicines (antihistamines). If the area involved is on your face, you may be referred to a specialist, such as an eye doctor (ophthalmologist) or an ear, nose, and throat (ENT) doctor (otolaryngologist) to help you avoid eye problems, chronic pain, or disability. Follow these instructions at home: Medicines  Take over-the-counter and prescription medicines only as told by your health care provider.  Apply an anti-itch cream or numbing cream to the affected area as told by your health care provider. Relieving itching and discomfort  Apply cold, wet cloths (cold compresses) to the area of the rash or blisters as told by your health care provider.  Cool baths can be soothing. Try adding baking soda or dry oatmeal to the water to reduce itching. Do not bathe in hot water.   Blister and rash care  Keep your rash covered with a loose bandage (dressing). Wear loose-fitting clothing to help ease the pain of material rubbing against the rash.  Keep your rash and blisters clean by washing the area with mild soap and cool water as told by your health care provider.  Check your rash every day for signs of infection. Check for: ? More redness, swelling, or pain. ?  Fluid or blood. ? Warmth. ? Pus or a bad smell.  Do not scratch your rash or pick at your blisters. To help avoid scratching: ? Keep your fingernails clean and cut short. ? Wear gloves or mittens while you sleep, if scratching is a problem. General instructions  Rest as told by your health care provider.  Keep all follow-up visits as told by your health care provider. This  is important.  Wash your hands often with soap and water. If soap and water are not available, use hand sanitizer. Doing this lowers your chance of getting a bacterial skin infection.  Before your blisters change into scabs, your shingles infection can cause chickenpox in people who have never had it or have never been vaccinated against it. To prevent this from happening, avoid contact with other people, especially: ? Babies. ? Pregnant women. ? Children who have eczema. ? Elderly people who have transplants. ? People who have chronic illnesses, such as cancer or AIDS. Contact a health care provider if:  Your pain is not relieved with prescribed medicines.  Your pain does not get better after the rash heals.  You have signs of infection in the rash area, such as: ? More redness, swelling, or pain around the rash. ? Fluid or blood coming from the rash. ? The rash area feeling warm to the touch. ? Pus or a bad smell coming from the rash. Get help right away if:  The rash is on your face or nose.  You have facial pain, pain around your eye area, or loss of feeling on one side of your face.  You have difficulty seeing.  You have ear pain or have ringing in your ear.  You have a loss of taste.  Your condition gets worse. Summary  Shingles, which is also known as herpes zoster, is an infection that causes a painful skin rash and fluid-filled blisters.  This condition is diagnosed with a skin exam. Skin or fluid samples may be taken from the blisters and examined before the diagnosis is made.  Keep your rash covered with a loose bandage (dressing). Wear loose-fitting clothing to help ease the pain of material rubbing against the rash.  Before your blisters change into scabs, your shingles infection can cause chickenpox in people who have never had it or have never been vaccinated against it. This information is not intended to replace advice given to you by your health care  provider. Make sure you discuss any questions you have with your health care provider. Document Revised: 09/26/2018 Document Reviewed: 02/06/2017 Elsevier Patient Education  2021 [employer].

## 2020-07-04 ENCOUNTER — Telehealth: Payer: Self-pay

## 2020-07-04 NOTE — Telephone Encounter (Signed)
Copied from [place] [id number]. Topic: General - Other >> Jul 04, 2020  9:55 AM Yvette Rack wrote: Reason for CRM: Pt stated the gabapentin (NEURONTIN) 300 MG capsule is too strong and she would like something not as strong. Pt requests another Rx

## 2020-07-04 NOTE — Telephone Encounter (Signed)
We can send in gabapentin 100mg  tid instead. This lower dose should be more tolerable

## 2020-07-05 MED ORDER — GABAPENTIN 100 MG PO CAPS: 100.0000 mg | ORAL_CAPSULE | Freq: Three times a day (TID) | ORAL | 1 refills | Status: DC

## 2020-07-05 NOTE — Telephone Encounter (Signed)
Pt called back and says she is willing to try this dose   [place], [address]  [place] [address]  Phone: [phone] Fax: [phone]

## 2020-07-06 NOTE — Telephone Encounter (Signed)
Okay to substitute 100 mg dose for the 300 mg.  Thank you.  Is not a controlled substance.  Thank you

## 2020-07-06 NOTE — Telephone Encounter (Signed)
Patient was advised.  

## 2020-07-08 NOTE — Telephone Encounter (Signed)
Yes  thx

## 2020-07-13 ENCOUNTER — Other Ambulatory Visit: Payer: Self-pay

## 2020-07-13 ENCOUNTER — Encounter: Payer: Self-pay | Admitting: Family Medicine

## 2020-07-13 ENCOUNTER — Ambulatory Visit (INDEPENDENT_AMBULATORY_CARE_PROVIDER_SITE_OTHER): Payer: Medicare HMO | Admitting: Family Medicine

## 2020-07-13 VITALS — BP 144/73 | HR 56 | Temp 97.8°F | Resp 16 | Ht 61.0 in | Wt 125.0 lb

## 2020-07-13 DIAGNOSIS — F32A Depression, unspecified: Secondary | ICD-10-CM

## 2020-07-13 DIAGNOSIS — R102 Pelvic and perineal pain: Secondary | ICD-10-CM | POA: Diagnosis not present

## 2020-07-13 DIAGNOSIS — B029 Zoster without complications: Secondary | ICD-10-CM | POA: Diagnosis not present

## 2020-07-13 LAB — POCT URINALYSIS DIPSTICK
Bilirubin, UA: NEGATIVE
Blood, UA: NEGATIVE
Glucose, UA: NEGATIVE
Ketones, UA: NEGATIVE
Leukocytes, UA: NEGATIVE
Nitrite, UA: NEGATIVE
Protein, UA: NEGATIVE
Spec Grav, UA: 1.01 (ref 1.010–1.025)
Urobilinogen, UA: 0.2 E.U./dL
pH, UA: 6 (ref 5.0–8.0)

## 2020-07-13 MED ORDER — TRAZODONE HCL 150 MG PO TABS: ORAL_TABLET | ORAL | 4 refills | Status: DC

## 2020-07-13 NOTE — Patient Instructions (Signed)
Take a Probiotic daily. Take gabapentin one tablet nightly for one week, then two tablets nightly for one week, and three tablets.

## 2020-07-13 NOTE — Progress Notes (Signed)
I,[name],acting as a scribe for Wilhemena Durie, MD.,have documented all relevant documentation on the behalf of Wilhemena Durie, MD,as directed by  Wilhemena Durie, MD while in the presence of Wilhemena Durie, MD.   Established patient visit   Patient: Jocelyn Harris   DOB: [birth year]   70 y.o. Female  MRN: [id number] Visit Date: 07/13/2020  Today's healthcare provider: Wilhemena Durie, MD   Chief Complaint  Patient presents with  . Follow-up  . Herpes Zoster   Subjective    HPI  Patient continues to have discomfort from previous visits.  She also has some lower back pain and lower abdominal pain. Her boyfriend died in Apr 26, 2023 from cancer Herpes zoster without complication From 9/92/4268-[facility] problem. Rash is consistent with shingles on right gluteus. Discussed keeping it covered as it passes by contact. Discussed etiology natural course of shingles. Encouraged Shingrix vaccination after it heals. Treat with Valtrex 1 g 3 times daily x7 days. Gabapentin for pain associated with it.  Acute bilateral low back pain with bilateral sciatica From 07/02/2019-Recurrent issue. No red flag symptoms, but mild radicular symptoms in both legs. Avoid prednisone given current shingles outbreak. No imaging necessary at this time. Discussed home exercise program. Discussed rest, stretching, heating pad. Gabapentin 3 times daily for symptom management. Return precautions discussed.  Patient states she has improved somewhat. However, she is having itching, burning and stinging. Patient states she now has stinging in her feet.       Medications: Outpatient Medications Prior to Visit  Medication Sig  . amLODipine (NORVASC) 5 MG tablet Take 1 tablet (5 mg total) by mouth daily.  . busPIRone (BUSPAR) 10 MG tablet Take 1 tablet (10 mg total) by mouth 3 (three) times daily.  . calcium gluconate 500 MG tablet Take 1 tablet by mouth daily.   . Cholecalciferol 1000 UNITS capsule  Take 1,000 Units by mouth daily.   . fluticasone (FLONASE) 50 MCG/ACT nasal spray SHAKE LIQUID AND USE 2 SPRAYS IN EACH NOSTRIL DAILY  . gabapentin (NEURONTIN) 100 MG capsule Take 1 capsule (100 mg total) by mouth 3 (three) times daily.  . hydrochlorothiazide (HYDRODIURIL) 25 MG tablet Take 1 tablet (25 mg total) by mouth daily.  Marland Kitchen ibuprofen (ADVIL) 800 MG tablet TAKE 1 TABLET BY MOUTH TWICE DAILY AS NEEDED  . lisinopril (ZESTRIL) 40 MG tablet TAKE 1 TABLET(40 MG) BY MOUTH DAILY  . Lysine 500 MG TABS Take by mouth as needed.   . montelukast (SINGULAIR) 10 MG tablet TAKE 1 TABLET BY MOUTH AT BEDTIME  . MULTIPLE VITAMINS PO Take by mouth daily.   . Omega-3 Fatty Acids (FISH OIL) 1000 MG CAPS Take by mouth daily.   Marland Kitchen omeprazole (PRILOSEC) 20 MG capsule TAKE ONE CAPSULE BY MOUTH TWICE DAILY BEFORE MEALS  . psyllium (REGULOID) 0.52 g capsule Take 0.52 g by mouth daily.  . sucralfate (CARAFATE) 1 g tablet TAKE 1 TABLET BY MOUTH FOUR TIMES DAILY BEFORE A MEAL AND AT BEDTIME  . vitamin E 100 UNIT capsule Take 100 Units by mouth daily.   . [DISCONTINUED] traZODone (DESYREL) 150 MG tablet TAKE 1 TABLET(150 MG) BY MOUTH AT BEDTIME   No facility-administered medications prior to visit.    Review of Systems  Constitutional: Negative for appetite change, chills, fatigue and fever.  Respiratory: Negative for chest tightness and shortness of breath.   Cardiovascular: Negative for chest pain and palpitations.  Gastrointestinal: Negative for abdominal pain, nausea and vomiting.  Neurological: Negative for dizziness and weakness.        Objective    BP (!) 144/73 (BP Location: Left Arm, Patient Position: Sitting, Cuff Size: Normal)   Pulse (!) 56   Temp 97.8 F (36.6 C) (Oral)   Resp 16   Ht 5\' 1"  (1.549 m)   Wt 125 lb (56.7 kg)   SpO2 99%   BMI 23.62 kg/m  BP Readings from Last 3 Encounters:  07/13/20 (!) 144/73  07/01/20 140/74  04/07/20 134/72   Wt Readings from Last 3 Encounters:   07/13/20 125 lb (56.7 kg)  07/01/20 124 lb (56.2 kg)  04/07/20 126 lb (57.2 kg)       Physical Exam Vitals reviewed.  Constitutional:      Appearance: She is well-developed.  HENT:     Head: Normocephalic and atraumatic.     Right Ear: External ear normal.     Left Ear: External ear normal.     Nose: Nose normal.  Eyes:     Conjunctiva/sclera: Conjunctivae normal.     Pupils: Pupils are equal, round, and reactive to light.  Cardiovascular:     Rate and Rhythm: Normal rate and regular rhythm.     Heart sounds: Normal heart sounds.  Pulmonary:     Effort: Pulmonary effort is normal.     Breath sounds: Normal breath sounds.  Abdominal:     General: Bowel sounds are normal.     Palpations: Abdomen is soft.     Tenderness: There is no abdominal tenderness.  Genitourinary:    Rectum: Internal hemorrhoid present.  Musculoskeletal:     Cervical back: Normal range of motion and neck supple.     Right lower leg: No edema.     Left lower leg: No edema.  Skin:    General: Skin is warm and dry.  Neurological:     General: No focal deficit present.     Mental Status: She is alert and oriented to person, place, and time.  Psychiatric:        Mood and Affect: Mood normal.        Behavior: Behavior normal.        Thought Content: Thought content normal.        Judgment: Judgment normal.       No results found for any visits on 07/13/20.  Assessment & Plan     1. Herpes zoster without complication Gabapentin at bedtime for 1 week then twice daily for 1 week then 3 times daily.  Follow-up in a month.  2. Depression, unspecified depression type Trazodone is refilled. - traZODone (DESYREL) 150 MG tablet; TAKE 1 TABLET(150 MG) BY MOUTH AT BEDTIME  Dispense: 30 tablet; Refill: 4  3. Pelvic pain With GI symptoms.  Try probiotic daily. - POCT urinalysis dipstick--Normal   Return in about 2 weeks (around 07/27/2020).      I, Wilhemena Durie, MD, have reviewed all  documentation for this visit. The documentation on 07/17/20 for the exam, diagnosis, procedures, and orders are all accurate and complete.    Richard Cranford Mon, MD  [facility] [phone] (phone) [phone] (fax)  [place]

## 2020-07-14 ENCOUNTER — Ambulatory Visit: Payer: Medicare HMO | Admitting: Family Medicine

## 2020-07-27 ENCOUNTER — Ambulatory Visit: Payer: Medicare HMO | Admitting: Family Medicine

## 2020-07-27 NOTE — Progress Notes (Deleted)
      Established patient visit   Patient: Jocelyn Harris   DOB: Feb 07, 1951   70 y.o. Female  MRN: [id number] Visit Date: 07/27/2020  Today's healthcare provider: Wilhemena Durie, MD   No chief complaint on file.  Subjective    HPI  Herpes zoster without complication From 08/23/6576-IONGEXBMWU at bedtime for 1 week then twice daily for 1 week then 3 times daily.  Follow-up in a month.  Pelvic pain From 07/13/2020-With GI symptoms. Try probiotic daily.  {Show patient history (optional):23778::" "}   Medications: Outpatient Medications Prior to Visit  Medication Sig  . amLODipine (NORVASC) 5 MG tablet Take 1 tablet (5 mg total) by mouth daily.  . busPIRone (BUSPAR) 10 MG tablet Take 1 tablet (10 mg total) by mouth 3 (three) times daily.  . calcium gluconate 500 MG tablet Take 1 tablet by mouth daily.   . Cholecalciferol 1000 UNITS capsule Take 1,000 Units by mouth daily.   . fluticasone (FLONASE) 50 MCG/ACT nasal spray SHAKE LIQUID AND USE 2 SPRAYS IN EACH NOSTRIL DAILY  . gabapentin (NEURONTIN) 100 MG capsule Take 1 capsule (100 mg total) by mouth 3 (three) times daily.  . hydrochlorothiazide (HYDRODIURIL) 25 MG tablet Take 1 tablet (25 mg total) by mouth daily.  Marland Kitchen ibuprofen (ADVIL) 800 MG tablet TAKE 1 TABLET BY MOUTH TWICE DAILY AS NEEDED  . lisinopril (ZESTRIL) 40 MG tablet TAKE 1 TABLET(40 MG) BY MOUTH DAILY  . Lysine 500 MG TABS Take by mouth as needed.   . montelukast (SINGULAIR) 10 MG tablet TAKE 1 TABLET BY MOUTH AT BEDTIME  . MULTIPLE VITAMINS PO Take by mouth daily.   . Omega-3 Fatty Acids (FISH OIL) 1000 MG CAPS Take by mouth daily.   Marland Kitchen omeprazole (PRILOSEC) 20 MG capsule TAKE ONE CAPSULE BY MOUTH TWICE DAILY BEFORE MEALS  . psyllium (REGULOID) 0.52 g capsule Take 0.52 g by mouth daily.  . sucralfate (CARAFATE) 1 g tablet TAKE 1 TABLET BY MOUTH FOUR TIMES DAILY BEFORE A MEAL AND AT BEDTIME  . traZODone (DESYREL) 150 MG tablet TAKE 1 TABLET(150 MG) BY MOUTH AT  BEDTIME  . vitamin E 100 UNIT capsule Take 100 Units by mouth daily.    No facility-administered medications prior to visit.    Review of Systems  Constitutional: Negative for appetite change, chills, fatigue and fever.  Respiratory: Negative for chest tightness and shortness of breath.   Cardiovascular: Negative for chest pain and palpitations.  Gastrointestinal: Negative for abdominal pain, nausea and vomiting.  Neurological: Negative for dizziness and weakness.    {Labs  Heme  Chem  Endocrine  Serology  Results Review (optional):23779::" "}   Objective    There were no vitals taken for this visit. {Show previous vital signs (optional):23777::" "}   Physical Exam  ***  No results found for any visits on 07/27/20.  Assessment & Plan     ***  No follow-ups on file.      {provider attestation***:1}   Wilhemena Durie, MD  [facility] [phone] (phone) [phone] (fax)  [place]

## 2020-08-03 ENCOUNTER — Other Ambulatory Visit: Payer: Self-pay

## 2020-08-03 ENCOUNTER — Ambulatory Visit (INDEPENDENT_AMBULATORY_CARE_PROVIDER_SITE_OTHER): Payer: Medicare HMO | Admitting: Family Medicine

## 2020-08-03 ENCOUNTER — Encounter: Payer: Self-pay | Admitting: Family Medicine

## 2020-08-03 VITALS — BP 126/72 | HR 59 | Temp 98.1°F | Resp 16 | Ht 61.0 in | Wt 126.0 lb

## 2020-08-03 DIAGNOSIS — I10 Essential (primary) hypertension: Secondary | ICD-10-CM | POA: Diagnosis not present

## 2020-08-03 DIAGNOSIS — F419 Anxiety disorder, unspecified: Secondary | ICD-10-CM | POA: Diagnosis not present

## 2020-08-03 DIAGNOSIS — R110 Nausea: Secondary | ICD-10-CM

## 2020-08-03 DIAGNOSIS — K219 Gastro-esophageal reflux disease without esophagitis: Secondary | ICD-10-CM | POA: Diagnosis not present

## 2020-08-03 NOTE — Patient Instructions (Signed)
Wear cockup wrist splint for left hand to sleep in. Stop all dairy products.

## 2020-08-03 NOTE — Progress Notes (Signed)
I,[name],acting as a scribe for Wilhemena Durie, MD.,have documented all relevant documentation on the behalf of Wilhemena Durie, MD,as directed by  Wilhemena Durie, MD while in the presence of Wilhemena Durie, MD.   Established patient visit   Patient: Jocelyn Harris   DOB: 04-16-1951   70 y.o. Female  MRN: [id number] Visit Date: 08/03/2020  Today's healthcare provider: Wilhemena Durie, MD   Chief Complaint  Patient presents with  . Follow-up   Subjective    HPI  Patient continues to have itching in the area of her previous rash. Has a couple of other issues.  She has had some postprandial diarrhea recently.  Will nausea but no other symptoms. She also is complaining of tingling in her next middle and ring fingers of her left hand at times.  No pain and no trauma. Herpes zoster without complication From 8/58/8502-DXAJOINOMV at bedtime for 1 week then twice daily for 1 week then 3 times daily.  Follow-up in a month.  Patient states she is doing better. However she is still having itching on her right shoulder.  Pelvic pain From 07/13/2020-With GI symptoms.  Try probiotic daily.  Patient is still having a lot of indigestion and heartburn. Also having increased bowel movements occasionally after she eats.       Medications: Outpatient Medications Prior to Visit  Medication Sig  . amLODipine (NORVASC) 5 MG tablet Take 1 tablet (5 mg total) by mouth daily.  . busPIRone (BUSPAR) 10 MG tablet Take 1 tablet (10 mg total) by mouth 3 (three) times daily.  . calcium gluconate 500 MG tablet Take 1 tablet by mouth daily.   . Cholecalciferol 1000 UNITS capsule Take 1,000 Units by mouth daily.   . fluticasone (FLONASE) 50 MCG/ACT nasal spray SHAKE LIQUID AND USE 2 SPRAYS IN EACH NOSTRIL DAILY  . gabapentin (NEURONTIN) 100 MG capsule Take 1 capsule (100 mg total) by mouth 3 (three) times daily.  . hydrochlorothiazide (HYDRODIURIL) 25 MG tablet Take 1 tablet  (25 mg total) by mouth daily.  Marland Kitchen ibuprofen (ADVIL) 800 MG tablet TAKE 1 TABLET BY MOUTH TWICE DAILY AS NEEDED  . lisinopril (ZESTRIL) 40 MG tablet TAKE 1 TABLET(40 MG) BY MOUTH DAILY  . Lysine 500 MG TABS Take by mouth as needed.   . montelukast (SINGULAIR) 10 MG tablet TAKE 1 TABLET BY MOUTH AT BEDTIME  . MULTIPLE VITAMINS PO Take by mouth daily.   . Omega-3 Fatty Acids (FISH OIL) 1000 MG CAPS Take by mouth daily.   Marland Kitchen omeprazole (PRILOSEC) 20 MG capsule TAKE ONE CAPSULE BY MOUTH TWICE DAILY BEFORE MEALS  . psyllium (REGULOID) 0.52 g capsule Take 0.52 g by mouth daily.  . sucralfate (CARAFATE) 1 g tablet TAKE 1 TABLET BY MOUTH FOUR TIMES DAILY BEFORE A MEAL AND AT BEDTIME  . traZODone (DESYREL) 150 MG tablet TAKE 1 TABLET(150 MG) BY MOUTH AT BEDTIME  . vitamin E 100 UNIT capsule Take 100 Units by mouth daily.    No facility-administered medications prior to visit.    Review of Systems  Constitutional: Negative for appetite change, chills, fatigue and fever.  Respiratory: Negative for chest tightness and shortness of breath.   Cardiovascular: Negative for chest pain and palpitations.  Gastrointestinal: Negative for abdominal pain, nausea and vomiting.  Neurological: Negative for dizziness and weakness.        Objective    BP 126/72 (BP Location: Right Arm, Patient Position: Sitting, Cuff Size: Normal)  Pulse (!) 59   Temp 98.1 F (36.7 C) (Oral)   Resp 16   Ht 5\' 1"  (1.549 m)   Wt 126 lb (57.2 kg)   SpO2 98%   BMI 23.81 kg/m  BP Readings from Last 3 Encounters:  08/03/20 126/72  07/13/20 (!) 144/73  07/01/20 140/74   Wt Readings from Last 3 Encounters:  08/03/20 126 lb (57.2 kg)  07/13/20 125 lb (56.7 kg)  07/01/20 124 lb (56.2 kg)       Physical Exam Vitals reviewed.  Constitutional:      Appearance: She is well-developed.  HENT:     Head: Normocephalic and atraumatic.     Right Ear: External ear normal.     Left Ear: External ear normal.     Nose: Nose  normal.  Eyes:     Conjunctiva/sclera: Conjunctivae normal.     Pupils: Pupils are equal, round, and reactive to light.  Cardiovascular:     Rate and Rhythm: Normal rate and regular rhythm.     Heart sounds: Normal heart sounds.  Pulmonary:     Effort: Pulmonary effort is normal.     Breath sounds: Normal breath sounds.  Abdominal:     General: Bowel sounds are normal.     Palpations: Abdomen is soft.     Tenderness: There is no abdominal tenderness.     Comments: Minimal right upper quadrant tenderness.   Genitourinary:    Rectum: Internal hemorrhoid present.  Musculoskeletal:     Cervical back: Normal range of motion and neck supple.     Right lower leg: No edema.     Left lower leg: No edema.  Skin:    General: Skin is warm and dry.  Neurological:     General: No focal deficit present.     Mental Status: She is alert and oriented to person, place, and time.  Psychiatric:        Mood and Affect: Mood normal.        Behavior: Behavior normal.        Thought Content: Thought content normal.        Judgment: Judgment normal.       No results found for any visits on 08/03/20.  Assessment & Plan     1. Primary hypertension Good control  2. Gastroesophageal reflux disease, unspecified whether esophagitis present On omeprazole.  3. Anxiety Treated.  Under fair control. She is mourning the death of her fianc  4. Nausea Follow-up after stopping dairy products for the diarrhea. May need further work-up 5.  Left carpal tunnel syndrome Try cock-up wrist splint at night   No follow-ups on file.      I, Wilhemena Durie, MD, have reviewed all documentation for this visit. The documentation on 08/06/20 for the exam, diagnosis, procedures, and orders are all accurate and complete.    Bianca Raneri Cranford Mon, MD  [facility] [phone] (phone) [phone] (fax)  [place]

## 2020-08-06 DIAGNOSIS — F3341 Major depressive disorder, recurrent, in partial remission: Secondary | ICD-10-CM | POA: Insufficient documentation

## 2020-08-27 ENCOUNTER — Other Ambulatory Visit: Payer: Self-pay | Admitting: Family Medicine

## 2020-08-27 DIAGNOSIS — K219 Gastro-esophageal reflux disease without esophagitis: Secondary | ICD-10-CM

## 2020-09-07 ENCOUNTER — Other Ambulatory Visit: Payer: Self-pay | Admitting: Family Medicine

## 2020-09-19 ENCOUNTER — Other Ambulatory Visit: Payer: Self-pay | Admitting: Family Medicine

## 2020-09-29 ENCOUNTER — Other Ambulatory Visit: Payer: Self-pay | Admitting: Family Medicine

## 2020-09-29 DIAGNOSIS — J309 Allergic rhinitis, unspecified: Secondary | ICD-10-CM

## 2020-09-30 ENCOUNTER — Other Ambulatory Visit: Payer: Self-pay | Admitting: Family Medicine

## 2020-09-30 DIAGNOSIS — F32A Depression, unspecified: Secondary | ICD-10-CM

## 2020-10-03 ENCOUNTER — Ambulatory Visit: Payer: Medicare HMO | Admitting: Family Medicine

## 2020-10-13 ENCOUNTER — Ambulatory Visit: Payer: Medicare HMO | Admitting: Family Medicine

## 2020-10-19 NOTE — Progress Notes (Deleted)
Established patient visit   Patient: Jocelyn Harris   DOB: 13-Oct-1950   70 y.o. Female  MRN: [id number] Visit Date: 10/20/2020  Today's healthcare provider: Wilhemena Durie, MD   No chief complaint on file.  Subjective    HPI  Hypertension, follow-up  BP Readings from Last 3 Encounters:  08/03/20 126/72  07/13/20 (!) 144/73  07/01/20 140/74   Wt Readings from Last 3 Encounters:  08/03/20 126 lb (57.2 kg)  07/13/20 125 lb (56.7 kg)  07/01/20 124 lb (56.2 kg)     She was last seen for hypertension 2 months ago.  BP at that visit was 126/72. Management since that visit includes no medication changes. Continue to monitor at home.   She reports {excellent/good/fair/poor:19665} compliance with treatment. She {is/is not:9024} having side effects. {document side effects if present:1} She is following a {diet:21022986} diet. She {is/is not:9024} exercising. She {does/does not:200015} smoke.  Use of agents associated with hypertension: {bp agents assoc with hypertension:511::"none"}.   Outside blood pressures are {***enter patient reported home BP readings, or 'not being checked':1}. Symptoms: {Yes/No:20286} chest pain {Yes/No:20286} chest pressure  {Yes/No:20286} palpitations {Yes/No:20286} syncope  {Yes/No:20286} dyspnea {Yes/No:20286} orthopnea  {Yes/No:20286} paroxysmal nocturnal dyspnea {Yes/No:20286} lower extremity edema   Pertinent labs: Lab Results  Component Value Date   CHOL 235 (H) 01/28/2020   HDL 58 01/28/2020   LDLCALC 148 (H) 01/28/2020   TRIG 161 (H) 01/28/2020   CHOLHDL 4.1 01/28/2020   Lab Results  Component Value Date   NA 142 01/28/2020   K 4.0 01/28/2020   CREATININE 0.77 01/28/2020   GFRNONAA 80 01/28/2020   GFRAA 92 01/28/2020   GLUCOSE 87 01/28/2020     The 10-year ASCVD risk score Mikey Bussing DC Jr., et al., 2013) is: 9.4%    GERD, Follow up:  The patient was last seen for GERD 2 months ago. Changes made since that visit include  no medication changes. Continue omeprazole BID.   She reports {excellent/good/fair/poor:19665} compliance with treatment.  Anxiety, Follow-up  She was last seen for anxiety 2 months ago. Changes made at last visit include no medication changes.   She reports {excellent/good/fair/poor:19665} compliance with treatment. She reports {good/fair/poor:18685} tolerance of treatment. She {is/is not:21021397} having side effects. {document side effects if present:1}  She feels her anxiety is {Desc; severity:60313} and {improved/worse/unchanged:3041574} since last visit.  Symptoms: {Yes/No:20286} chest pain {Yes/No:20286} difficulty concentrating  {Yes/No:20286} dizziness {Yes/No:20286} fatigue  {Yes/No:20286} feelings of losing control {Yes/No:20286} insomnia  {Yes/No:20286} irritable {Yes/No:20286} palpitations  {Yes/No:20286} panic attacks {Yes/No:20286} racing thoughts  {Yes/No:20286} shortness of breath {Yes/No:20286} sweating  {Yes/No:20286} tremors/shakes    PHQ-9 Scores PHQ9 SCORE ONLY 07/01/2020 03/08/2020 11/26/2019  PHQ-9 Total Score 0 2 0     {Show patient history (optional):23778::" "}   Medications: Outpatient Medications Prior to Visit  Medication Sig  . amLODipine (NORVASC) 5 MG tablet Take 1 tablet (5 mg total) by mouth daily.  . busPIRone (BUSPAR) 10 MG tablet TAKE 1 TABLET(10 MG) BY MOUTH THREE TIMES DAILY  . calcium gluconate 500 MG tablet Take 1 tablet by mouth daily.   . Cholecalciferol 1000 UNITS capsule Take 1,000 Units by mouth daily.   . fluticasone (FLONASE) 50 MCG/ACT nasal spray SHAKE LIQUID AND USE 2 SPRAYS IN EACH NOSTRIL DAILY  . gabapentin (NEURONTIN) 100 MG capsule Take 1 capsule (100 mg total) by mouth 3 (three) times daily.  . hydrochlorothiazide (HYDRODIURIL) 25 MG tablet Take 1 tablet (25 mg total) by mouth daily.  Marland Kitchen  ibuprofen (ADVIL) 800 MG tablet TAKE 1 TABLET BY MOUTH TWICE DAILY AS NEEDED  . lisinopril (ZESTRIL) 40 MG tablet TAKE 1 TABLET(40 MG)  BY MOUTH DAILY  . Lysine 500 MG TABS Take by mouth as needed.   . montelukast (SINGULAIR) 10 MG tablet TAKE 1 TABLET BY MOUTH AT BEDTIME  . MULTIPLE VITAMINS PO Take by mouth daily.   . Omega-3 Fatty Acids (FISH OIL) 1000 MG CAPS Take by mouth daily.   Marland Kitchen omeprazole (PRILOSEC) 20 MG capsule TAKE ONE CAPSULE BY MOUTH TWICE DAILY BEFORE MEALS  . psyllium (REGULOID) 0.52 g capsule Take 0.52 g by mouth daily.  . sucralfate (CARAFATE) 1 g tablet TAKE 1 TABLET BY MOUTH FOUR TIMES DAILY BEFORE A MEAL AND AT BEDTIME  . traZODone (DESYREL) 150 MG tablet TAKE 1 TABLET(150 MG) BY MOUTH AT BEDTIME  . vitamin E 100 UNIT capsule Take 100 Units by mouth daily.    No facility-administered medications prior to visit.    Review of Systems  Constitutional: Negative for activity change and fatigue.  Respiratory: Negative for cough and shortness of breath.   Cardiovascular: Negative for chest pain, palpitations and leg swelling.  Musculoskeletal: Negative for arthralgias and myalgias.  Neurological: Negative for dizziness and numbness.  Psychiatric/Behavioral: Negative for agitation, self-injury, sleep disturbance and suicidal ideas. The patient is not nervous/anxious.     {Labs  Heme  Chem  Endocrine  Serology  Results Review (optional):23779::" "}   Objective    There were no vitals taken for this visit. {Show previous vital signs (optional):23777::" "}   Physical Exam  ***  No results found for any visits on 10/20/20.  Assessment & Plan     ***  No follow-ups on file.      {provider attestation***:1}   Wilhemena Durie, MD  [facility] [phone] (phone) [phone] (fax)  [place]

## 2020-10-20 ENCOUNTER — Ambulatory Visit: Payer: Medicare HMO | Admitting: Family Medicine

## 2020-10-29 ENCOUNTER — Other Ambulatory Visit: Payer: Self-pay | Admitting: Family Medicine

## 2020-10-29 DIAGNOSIS — J3089 Other allergic rhinitis: Secondary | ICD-10-CM

## 2020-10-31 ENCOUNTER — Ambulatory Visit: Payer: Self-pay | Admitting: *Deleted

## 2020-10-31 NOTE — Telephone Encounter (Addendum)
Calls with several days of fatigue, intermittent lightheadedness, no vertigo.B/P today 99/64, yesterday 110/65 and 128/68. Normal for her is 120's/80's. Has sinus pressure under the eyes and mild intermittent ear discomfort. No fever/SOB/CP. Patient will do a home Covid test and call back for appointment. Covid home test negative today. Reviewed B/P medications X3. Care Advice for when she feels lightheaded including lie down and elevate lower legs, add small amount salt to her diet that day and increase daily water intake. Requesting appointment for Thursday. None available with pcp, scheduled with Simona Huh.  She wants to review her medications and refills. No availability with pcp until June.  Note-03/15/20 Lisinopril 40 mg tab was discontinued without reorder. 04/07/20 note with b/p controlled with lisinopril and amlodipine. She also takes Hydrochlorothiazide 25 mg tab daily.   Reason for Disposition . [1] MILD dizziness (e.g., walking normally) AND [2] has NOT been evaluated by physician for this  (Exception: dizziness caused by heat exposure, sudden standing, or poor fluid intake)  Answer Assessment - Initial Assessment Questions 1. DESCRIPTION: "Describe your dizziness."     lightheaded 2. LIGHTHEADED: "Do you feel lightheaded?" (e.g., somewhat faint, woozy, weak upon standing)     Woozy  3. VERTIGO: "Do you feel like either you or the room is spinning or tilting?" (i.e. vertigo)     no 4. SEVERITY: "How bad is it?"  "Do you feel like you are going to faint?" "Can you stand and walk?"   - MILD: Feels slightly dizzy, but walking normally.   - MODERATE: Feels unsteady when walking, but not falling; interferes with normal activities (e.g., school, work).   - SEVERE: Unable to walk without falling, or requires assistance to walk without falling; feels like passing out now.      mild 5. ONSET:  "When did the dizziness begin?"     Few days ago 6. AGGRAVATING FACTORS: "Does anything make it  worse?" (e.g., standing, change in head position)     Nothing in particular 7. HEART RATE: "Can you tell me your heart rate?" "How many beats in 15 seconds?"  (Note: not all patients can do this)       no 8. CAUSE: "What do you think is causing the dizziness?"     sinus 9. RECURRENT SYMPTOM: "Have you had dizziness before?" If Yes, ask: "When was the last time?" "What happened that time?"     no 10. OTHER SYMPTOMS: "Do you have any other symptoms?" (e.g., fever, chest pain, vomiting, diarrhea, bleeding)       no 11. PREGNANCY: "Is there any chance you are pregnant?" "When was your last menstrual period?"       na  Protocols used: DIZZINESS [facility]

## 2020-11-03 ENCOUNTER — Ambulatory Visit (INDEPENDENT_AMBULATORY_CARE_PROVIDER_SITE_OTHER): Payer: Medicare HMO | Admitting: Family Medicine

## 2020-11-03 ENCOUNTER — Encounter: Payer: Self-pay | Admitting: Family Medicine

## 2020-11-03 ENCOUNTER — Other Ambulatory Visit: Payer: Self-pay

## 2020-11-03 VITALS — BP 114/50 | HR 64 | Ht 61.0 in | Wt 123.2 lb

## 2020-11-03 DIAGNOSIS — I10 Essential (primary) hypertension: Secondary | ICD-10-CM

## 2020-11-03 DIAGNOSIS — J3089 Other allergic rhinitis: Secondary | ICD-10-CM | POA: Diagnosis not present

## 2020-11-03 MED ORDER — CETIRIZINE HCL 10 MG PO TABS: 10.0000 mg | ORAL_TABLET | Freq: Every day | ORAL | 1 refills | Status: DC

## 2020-11-03 NOTE — Progress Notes (Signed)
Established patient visit   Patient: Jocelyn Harris   DOB: 03/10/1951   70 y.o. Female  MRN: [id number] Visit Date: 11/03/2020  Today's healthcare provider: Vernie Murders, PA-C   No chief complaint on file.  Subjective    Sinus Problem This is a new problem. The current episode started in the past 7 days. The problem is unchanged. There has been no fever. Her pain is at a severity of 5/10. The pain is mild. Associated symptoms include congestion, ear pain, headaches and sinus pressure. Pertinent negatives include no shortness of breath. (Left ear pain ) Past treatments include oral decongestants. The treatment provided mild relief.     Patient would also like to discuss low BP diastolic number.   Past Medical History:  Diagnosis Date  . GERD (gastroesophageal reflux disease)   . Hypertension    Past Surgical History:  Procedure Laterality Date  . KNEE SURGERY Right   . TOE SURGERY    . TUBAL LIGATION     Social History   Tobacco Use  . Smoking status: Never Smoker  . Smokeless tobacco: Never Used  Vaping Use  . Vaping Use: Never used  Substance Use Topics  . Alcohol use: No    Alcohol/week: 0.0 standard drinks  . Drug use: No   Family History  Problem Relation Age of Onset  . Diabetes Mother   . Hypertension Mother   . Congestive Heart Failure Mother   . Hypertension Father   . Lung cancer Father   . Fibromyalgia Sister   . Lupus Sister   . Cancer Sister        of blood  . Healthy Daughter    Allergies  Allergen Reactions  . Augmentin [Amoxicillin-Pot Clavulanate] Nausea Only    Patient reports that her reflux was bad when she took medication  . Doxycycline Nausea Only and Nausea And Vomiting       Medications: Outpatient Medications Prior to Visit  Medication Sig  . busPIRone (BUSPAR) 10 MG tablet TAKE 1 TABLET(10 MG) BY MOUTH THREE TIMES DAILY  . calcium gluconate 500 MG tablet Take 1 tablet by mouth daily.   . Cholecalciferol 1000  UNITS capsule Take 1,000 Units by mouth daily.   . fluticasone (FLONASE) 50 MCG/ACT nasal spray SHAKE LIQUID AND USE 2 SPRAYS IN EACH NOSTRIL DAILY  . gabapentin (NEURONTIN) 100 MG capsule Take 1 capsule (100 mg total) by mouth 3 (three) times daily.  . hydrochlorothiazide (HYDRODIURIL) 25 MG tablet Take 1 tablet (25 mg total) by mouth daily.  Marland Kitchen ibuprofen (ADVIL) 800 MG tablet TAKE 1 TABLET BY MOUTH TWICE DAILY AS NEEDED  . lisinopril (ZESTRIL) 40 MG tablet TAKE 1 TABLET(40 MG) BY MOUTH DAILY  . Lysine 500 MG TABS Take by mouth as needed.   . montelukast (SINGULAIR) 10 MG tablet TAKE 1 TABLET BY MOUTH AT BEDTIME  . MULTIPLE VITAMINS PO Take by mouth daily.   . Omega-3 Fatty Acids (FISH OIL) 1000 MG CAPS Take by mouth daily.   Marland Kitchen omeprazole (PRILOSEC) 20 MG capsule TAKE ONE CAPSULE BY MOUTH TWICE DAILY BEFORE MEALS  . psyllium (REGULOID) 0.52 g capsule Take 0.52 g by mouth daily.  . sucralfate (CARAFATE) 1 g tablet TAKE 1 TABLET BY MOUTH FOUR TIMES DAILY BEFORE A MEAL AND AT BEDTIME  . traZODone (DESYREL) 150 MG tablet TAKE 1 TABLET(150 MG) BY MOUTH AT BEDTIME  . vitamin E 100 UNIT capsule Take 100 Units by mouth daily.   . [  DISCONTINUED] amLODipine (NORVASC) 5 MG tablet Take 1 tablet (5 mg total) by mouth daily.   No facility-administered medications prior to visit.    Review of Systems  HENT: Positive for congestion, ear pain and sinus pressure.   Respiratory: Negative for shortness of breath.   Neurological: Positive for headaches.       Objective    BP (!) 114/50 (BP Location: Left Arm, Patient Position: Sitting, Cuff Size: Small)   Pulse 64   Ht 5\' 1"  (1.549 m)   Wt 123 lb 3.2 oz (55.9 kg)   SpO2 100%   BMI 23.28 kg/m  BP Readings from Last 3 Encounters:  11/03/20 (!) 114/50  08/03/20 126/72  07/13/20 (!) 144/73   Wt Readings from Last 3 Encounters:  11/03/20 123 lb 3.2 oz (55.9 kg)  08/03/20 126 lb (57.2 kg)  07/13/20 125 lb (56.7 kg)     Physical  Exam Constitutional:      General: She is not in acute distress.    Appearance: She is well-developed.  HENT:     Head: Normocephalic and atraumatic.     Right Ear: Hearing and tympanic membrane normal.     Left Ear: Hearing and tympanic membrane normal.     Nose: Nose normal. No congestion.     Mouth/Throat:     Pharynx: Oropharynx is clear.  Eyes:     General: Lids are normal. No scleral icterus.       Right eye: No discharge.        Left eye: No discharge.     Conjunctiva/sclera: Conjunctivae normal.  Cardiovascular:     Rate and Rhythm: Normal rate and regular rhythm.     Heart sounds: Normal heart sounds.  Pulmonary:     Effort: Pulmonary effort is normal. No respiratory distress.     Breath sounds: Normal breath sounds.  Abdominal:     General: Bowel sounds are normal.     Palpations: Abdomen is soft.  Musculoskeletal:        General: Normal range of motion.     Cervical back: Normal range of motion and neck supple.  Skin:    Findings: No lesion or rash.  Neurological:     Mental Status: She is alert and oriented to person, place, and time.  Psychiatric:        Speech: Speech normal.        Behavior: Behavior normal.        Thought Content: Thought content normal.     No results found for any visits on 11/03/20.  Assessment & Plan     1. Seasonal allergic rhinitis due to other allergic trigger Sneezing and sinus congestion controlled with Zyrtec. May use Flonase Nasal Inhaler 2 sprays each nostril hs and refill the Zyrtec. Recheck prn. - cetirizine (ZYRTEC) 10 MG tablet; Take 1 tablet (10 mg total) by mouth daily.  Dispense: 90 tablet; Refill: 1 - CBC with Differential/Platelet  2. Primary hypertension BP a little low today. Still taking the Lisinopril 40 mg qd and HCTZ 25 mg qd with the Amlodipine 5 mg qd. States she did not stop the Amlodipine as discussed at her last OV. May stop it now and continue to monitor the BP. - CBC with Differential/Platelet -  Comprehensive metabolic panel - TSH   No follow-ups on file.      I, Elinora Weigand, PA-C, have reviewed all documentation for this visit. The documentation on 11/03/20 for the exam, diagnosis, procedures, and orders are all  accurate and complete.    Vernie Murders, PA-C  [employer] [phone] (phone) [phone] (fax)  [place]

## 2020-11-10 DIAGNOSIS — I10 Essential (primary) hypertension: Secondary | ICD-10-CM | POA: Diagnosis not present

## 2020-11-10 DIAGNOSIS — R5383 Other fatigue: Secondary | ICD-10-CM | POA: Diagnosis not present

## 2020-11-10 DIAGNOSIS — J3089 Other allergic rhinitis: Secondary | ICD-10-CM | POA: Diagnosis not present

## 2020-11-10 DIAGNOSIS — K921 Melena: Secondary | ICD-10-CM | POA: Diagnosis not present

## 2020-11-10 DIAGNOSIS — D649 Anemia, unspecified: Secondary | ICD-10-CM | POA: Diagnosis not present

## 2020-11-11 LAB — COMPREHENSIVE METABOLIC PANEL
ALT: 11 IU/L (ref 0–32)
AST: 12 IU/L (ref 0–40)
Albumin/Globulin Ratio: 2.3 — ABNORMAL HIGH (ref 1.2–2.2)
Albumin: 4.3 g/dL (ref 3.8–4.8)
Alkaline Phosphatase: 73 IU/L (ref 44–121)
BUN/Creatinine Ratio: 20 (ref 12–28)
BUN: 24 mg/dL (ref 8–27)
Bilirubin Total: 0.3 mg/dL (ref 0.0–1.2)
CO2: 27 mmol/L (ref 20–29)
Calcium: 9.4 mg/dL (ref 8.7–10.3)
Chloride: 103 mmol/L (ref 96–106)
Creatinine, Ser: 1.23 mg/dL — ABNORMAL HIGH (ref 0.57–1.00)
Globulin, Total: 1.9 g/dL (ref 1.5–4.5)
Glucose: 96 mg/dL (ref 65–99)
Potassium: 3.5 mmol/L (ref 3.5–5.2)
Sodium: 142 mmol/L (ref 134–144)
Total Protein: 6.2 g/dL (ref 6.0–8.5)
eGFR: 48 mL/min/{1.73_m2} — ABNORMAL LOW (ref >59)

## 2020-11-11 LAB — CBC WITH DIFFERENTIAL/PLATELET
Basophils Absolute: 0.1 10*3/uL (ref 0.0–0.2)
Basos: 1 %
EOS (ABSOLUTE): 0.1 10*3/uL (ref 0.0–0.4)
Eos: 2 %
Hematocrit: 32.6 % — ABNORMAL LOW (ref 34.0–46.6)
Hemoglobin: 10.4 g/dL — ABNORMAL LOW (ref 11.1–15.9)
Immature Grans (Abs): 0 10*3/uL (ref 0.0–0.1)
Immature Granulocytes: 0 %
Lymphocytes Absolute: 2.9 10*3/uL (ref 0.7–3.1)
Lymphs: 41 %
MCH: 28.3 pg (ref 26.6–33.0)
MCHC: 31.9 g/dL (ref 31.5–35.7)
MCV: 89 fL (ref 79–97)
Monocytes Absolute: 0.5 10*3/uL (ref 0.1–0.9)
Monocytes: 7 %
Neutrophils Absolute: 3.5 10*3/uL (ref 1.4–7.0)
Neutrophils: 49 %
Platelets: 243 10*3/uL (ref 150–450)
RBC: 3.68 x10E6/uL — ABNORMAL LOW (ref 3.77–5.28)
RDW: 12.8 % (ref 11.7–15.4)
WBC: 7.1 10*3/uL (ref 3.4–10.8)

## 2020-11-11 LAB — TSH: TSH: 2.72 u[IU]/mL (ref 0.450–4.500)

## 2020-11-15 ENCOUNTER — Other Ambulatory Visit (INDEPENDENT_AMBULATORY_CARE_PROVIDER_SITE_OTHER): Payer: Medicare HMO

## 2020-11-15 DIAGNOSIS — D649 Anemia, unspecified: Secondary | ICD-10-CM | POA: Diagnosis not present

## 2020-11-15 LAB — POCT URINALYSIS DIPSTICK
Bilirubin, UA: NEGATIVE
Blood, UA: NEGATIVE
Glucose, UA: NEGATIVE
Ketones, UA: NEGATIVE
Leukocytes, UA: NEGATIVE
Nitrite, UA: NEGATIVE
Protein, UA: NEGATIVE
Spec Grav, UA: 1.01 (ref 1.010–1.025)
Urobilinogen, UA: 0.2 E.U./dL
pH, UA: 7 (ref 5.0–8.0)

## 2020-11-15 NOTE — Progress Notes (Signed)
Patient stopped by to give urine specimen and picked up OC lite kit per provider instructions. Urine was WNL. Results sent to provider for review.

## 2020-11-18 ENCOUNTER — Other Ambulatory Visit (INDEPENDENT_AMBULATORY_CARE_PROVIDER_SITE_OTHER): Payer: Medicare HMO | Admitting: Family Medicine

## 2020-11-18 DIAGNOSIS — D649 Anemia, unspecified: Secondary | ICD-10-CM

## 2020-11-18 LAB — IFOBT (OCCULT BLOOD): IFOBT: POSITIVE

## 2020-11-18 NOTE — Progress Notes (Signed)
Patient came by office to drop off O.C Lite stool sample given to her on 11/15/20, results are noted in chart.

## 2020-11-22 ENCOUNTER — Telehealth: Payer: Self-pay

## 2020-11-22 DIAGNOSIS — D649 Anemia, unspecified: Secondary | ICD-10-CM

## 2020-11-22 DIAGNOSIS — R195 Other fecal abnormalities: Secondary | ICD-10-CM

## 2020-11-22 NOTE — Telephone Encounter (Signed)
Advised patient of results per Simona Huh on 6/5. She verbally understands.

## 2020-11-22 NOTE — Telephone Encounter (Signed)
Patient called in to get a little more information about her results, as well as the referral that was given, pt request that someone reach out to her. Please advise

## 2020-11-22 NOTE — Telephone Encounter (Signed)
-----   Message from Margo Common, PA-C sent at 11/22/2020 10:07 AM EDT ----- All iron tests are normal. Stool test positive for blood in GI tract. Need referral to gastroenterologist (Dr. Allen Norris) who did your colonoscopy. May need upper GI endoscopy to rule out a bleeding ulcer.

## 2020-11-24 ENCOUNTER — Other Ambulatory Visit: Payer: Self-pay

## 2020-11-24 ENCOUNTER — Telehealth: Payer: Self-pay | Admitting: *Deleted

## 2020-11-24 ENCOUNTER — Encounter: Payer: Self-pay | Admitting: Gastroenterology

## 2020-11-24 ENCOUNTER — Ambulatory Visit (INDEPENDENT_AMBULATORY_CARE_PROVIDER_SITE_OTHER): Payer: Medicare HMO | Admitting: Gastroenterology

## 2020-11-24 VITALS — BP 128/68 | HR 62 | Temp 98.2°F | Wt 120.6 lb

## 2020-11-24 DIAGNOSIS — D649 Anemia, unspecified: Secondary | ICD-10-CM

## 2020-11-24 DIAGNOSIS — R195 Other fecal abnormalities: Secondary | ICD-10-CM

## 2020-11-24 DIAGNOSIS — K921 Melena: Secondary | ICD-10-CM | POA: Diagnosis not present

## 2020-11-24 MED ORDER — NA SULFATE-K SULFATE-MG SULF 17.5-3.13-1.6 GM/177ML PO SOLN: 1.0000 | Freq: Once | ORAL | 0 refills | Status: AC

## 2020-11-24 NOTE — Progress Notes (Signed)
Jocelyn Harris [address]  [place], [place] [address]  Main: [phone]  Fax: [phone]   Gastroenterology Consultation  Referring Provider:     Margo Common, PA-C Primary Care Physician:  Jerrol Banana., MD Reason for Consultation:     Anemia, positive FOBT        HPI:    Chief Complaint  Patient presents with   Positive Occult Stool    Problem with intermittent constipation and loose stools... also abdominal bloating, fatigue    Jocelyn Harris is a 70 y.o. y/o female referred for consultation & management  by Dr. Rosanna Randy, Retia Passe., MD. patient reports 2 to 46-monthhistory of fatigue, and was recently found to have anemia, with positive FOBT by PCP.  Patient denies any nausea or vomiting.  Does report burning, dull, epigastric pain, intermittent, 5/10, nonradiating.  Has been taking ibuprofen twice a day for a long time.  Reports noting black stools for about a week, 2 to 3 weeks ago and since then has had brown stool.  No prior history of GI bleed.  Has also been on Prilosec daily for years.  Also reports seeing bright red blood on tissue paper when wiping intermittently.  May have seen it once in her stool as well 1 to 2 months ago as per patient.  Also reports some dysphagia to solid foods, about once every 3 to 4 weeks.  No episodes of food impaction.  No weight loss.  Last colonoscopy was in 2015 by Dr. OCandace Cruisefor screening which was normal and repeat was recommended in 10 years.  Withdrawal time was less than 6 minutes.  Patient had an EGD in 1995 for abdominal pain that showed gastritis, duodenitis, hiatal hernia.  Past Medical History:  Diagnosis Date   GERD (gastroesophageal reflux disease)    Hypertension     Past Surgical History:  Procedure Laterality Date   KNEE SURGERY Right    TOE SURGERY     TUBAL LIGATION      Prior to Admission medications   Medication Sig Start Date End Date Taking? Authorizing Provider   busPIRone (BUSPAR) 10 MG tablet TAKE 1 TABLET(10 MG) BY MOUTH THREE TIMES DAILY 09/30/20  Yes GJerrol Banana, MD  calcium gluconate 500 MG tablet Take 1 tablet by mouth daily.    Yes [provider]  cetirizine (ZYRTEC) 10 MG tablet Take 1 tablet (10 mg total) by mouth daily. 11/03/20  Yes Chrismon, DVickki Muff PA-C  Cholecalciferol 1000 UNITS capsule Take 1,000 Units by mouth daily.    Yes [provider]  fluticasone (FLONASE) 50 MCG/ACT nasal spray SHAKE LIQUID AND USE 2 SPRAYS IN EACH NOSTRIL DAILY 06/30/20  Yes GJerrol Banana, MD  gabapentin (NEURONTIN) 100 MG capsule Take 1 capsule (100 mg total) by mouth 3 (three) times daily. 07/05/20  Yes Bacigalupo, ADionne Bucy MD  hydrochlorothiazide (HYDRODIURIL) 25 MG tablet Take 1 tablet (25 mg total) by mouth daily. 03/08/20  Yes GJerrol Banana, MD  ibuprofen (ADVIL) 800 MG tablet TAKE 1 TABLET BY MOUTH TWICE DAILY AS NEEDED 09/19/20  Yes GJerrol Banana, MD  lisinopril (ZESTRIL) 40 MG tablet TAKE 1 TABLET(40 MG) BY MOUTH DAILY 03/15/20  Yes GJerrol Banana, MD  Lysine 500 MG TABS Take by mouth as needed.    Yes [provider]  montelukast (SINGULAIR) 10 MG tablet TAKE 1 TABLET BY MOUTH AT BEDTIME 09/29/20  Yes Jerrol Banana., MD  MULTIPLE VITAMINS PO Take by mouth daily.    Yes [provider]  Na Sulfate-K Sulfate-Mg Sulf 17.5-3.13-1.6 GM/177ML SOLN Take 1 kit by mouth once for 1 dose. 11/24/20 11/24/20 Yes Saige Canton, Lennette Bihari, MD  Omega-3 Fatty Acids (FISH OIL) 1000 MG CAPS Take by mouth daily.    Yes [provider]  omeprazole (PRILOSEC) 20 MG capsule TAKE ONE CAPSULE BY MOUTH TWICE DAILY BEFORE MEALS 12/07/19  Yes Jerrol Banana., MD  Probiotic Product (PROBIOTIC ADVANCED PO) Take by mouth.   Yes [provider]  psyllium (REGULOID) 0.52 g capsule Take 0.52 g by mouth daily.   Yes [provider]  sucralfate (CARAFATE) 1 g tablet TAKE 1 TABLET  BY MOUTH FOUR TIMES DAILY BEFORE A MEAL AND AT BEDTIME 05/07/20  Yes Jerrol Banana., MD  traZODone (DESYREL) 150 MG tablet TAKE 1 TABLET(150 MG) BY MOUTH AT BEDTIME 07/13/20  Yes Jerrol Banana., MD  vitamin E 100 UNIT capsule Take 100 Units by mouth daily.    Yes [provider]    Family History  Problem Relation Age of Onset   Diabetes Mother    Hypertension Mother    Congestive Heart Failure Mother    Hypertension Father    Lung cancer Father    Fibromyalgia Sister    Lupus Sister    Cancer Sister        of blood   Healthy Daughter      Social History   Tobacco Use   Smoking status: Never   Smokeless tobacco: Never  Vaping Use   Vaping Use: Never used  Substance Use Topics   Alcohol use: No    Alcohol/week: 0.0 standard drinks   Drug use: No    Allergies as of 11/24/2020 - Review Complete 11/24/2020  Allergen Reaction Noted   Augmentin [amoxicillin-pot clavulanate] Nausea Only 06/07/2016   Doxycycline Nausea Only and Nausea And Vomiting 01/27/2015    Review of Systems:    All systems reviewed and negative except where noted in HPI.   Physical Exam:  BP 128/68   Pulse 62   Temp 98.2 F (36.8 C) (Oral)   Wt 120 lb 9.6 oz (54.7 kg)   BMI 22.79 kg/m  No LMP recorded. Patient is postmenopausal. Psych:  Alert and cooperative. Normal mood and affect. General:   Alert,  Well-developed, well-nourished, pleasant and cooperative in NAD Head:  Normocephalic and atraumatic. Eyes:  Sclera clear, no icterus.   Conjunctiva pink. Ears:  Normal auditory acuity. Nose:  No deformity, discharge, or lesions. Mouth:  No deformity or lesions,oropharynx pink & moist. Neck:  Supple; no masses or thyromegaly. Abdomen:  Normal bowel sounds.  No bruits.  Soft, non-tender and non-distended without masses, hepatosplenomegaly or hernias noted.  No guarding or rebound tenderness.    Msk:  Symmetrical without gross deformities. Good, equal movement & strength  bilaterally. Pulses:  Normal pulses noted. Extremities:  No clubbing or edema.  No cyanosis. Neurologic:  Alert and oriented x3;  grossly normal neurologically. Skin:  Intact without significant lesions or rashes. No jaundice. Lymph Nodes:  No significant cervical adenopathy. Psych:  Alert and cooperative. Normal mood and affect.   Labs: CBC    Component Value Date/Time   WBC 7.1 11/10/2020 0953   RBC 3.68 (L) 11/10/2020 0953   HGB 10.4 (L) 11/10/2020 0953   HCT 32.6 (L) 11/10/2020 0953   PLT 243 11/10/2020 1941  MCV 89 11/10/2020 0953   MCH 28.3 11/10/2020 0953   MCHC 31.9 11/10/2020 0953   RDW 12.8 11/10/2020 0953   LYMPHSABS 2.9 11/10/2020 0953   EOSABS 0.1 11/10/2020 0953   BASOSABS 0.1 11/10/2020 0953   CMP     Component Value Date/Time   NA 142 11/10/2020 0953   K 3.5 11/10/2020 0953   CL 103 11/10/2020 0953   CO2 27 11/10/2020 0953   GLUCOSE 96 11/10/2020 0953   BUN 24 11/10/2020 0953   CREATININE 1.23 (H) 11/10/2020 0953   CALCIUM 9.4 11/10/2020 0953   PROT 6.2 11/10/2020 0953   ALBUMIN 4.3 11/10/2020 0953   AST 12 11/10/2020 0953   ALT 11 11/10/2020 0953   ALKPHOS 73 11/10/2020 0953   BILITOT 0.3 11/10/2020 0953   GFRNONAA 80 01/28/2020 1001   GFRAA 92 01/28/2020 1001    Imaging Studies: No results found.  Assessment and Plan:   Jocelyn Harris is a 70 y.o. y/o female has been referred for anemia and positive FOBT  EGD is indicated for further evaluation of melena in the setting of NSAID use that occurred about 2 to 3 weeks ago, associated with anemia, and burning sensation in stomach  NSAID induced gastric ulcers or gastritis is on the differential  Since patient is on Prilosec, this may have actually helped her and since she is not having any further melanotic bowel movements and is hemodynamically stable, no indication for inpatient admission or emergent EGD.  We will try to schedule EGD soon as possible, which would be next week at this  time.  I have advised her to speak to her PCP for alternative medications than NSAIDs given her symptoms.  She does have Tylenol as needed at home.  I have messaged Dr. Rosanna Randy in this regard as well and asked her to call their office to discuss this as well  Colonoscopy indicated for further evaluation of anemia and positive FOBT as well.  Patient also reporting streaks of blood on toilet paper which may be due to underlying hemorrhoids, but malignancy will need to be ruled out given anemia and positive FOBT  I have discussed alternative options, risks & benefits,  which include, but are not limited to, bleeding, infection, perforation,respiratory complication & drug reaction.  The patient agrees with this plan & written consent will be obtained.      Dr Jocelyn Harris  Speech recognition software was used to dictate the above note.

## 2020-11-24 NOTE — Telephone Encounter (Signed)
Per Dr. Rosanna Randy. Sharyn Lull, please have pt stop all Ibuprofen and try Tylenol [phone] BID as needed. for pain. Called patient to advise. No answer and no vm. Will try again later.

## 2020-11-25 NOTE — Telephone Encounter (Signed)
LMOVM for pt to return call 

## 2020-11-25 NOTE — Telephone Encounter (Signed)
Pt returned the office call. Advised pt per PCP below. Pt expressed understanding.   Pt also scheduled follow up visit with PCP.

## 2020-11-28 IMAGING — CR DG ANKLE COMPLETE 3+V*L*
1 series · 3 of 3 positions shown · non-contrast
Comparison: None.

CLINICAL DATA: Left ankle pain around lateral malleolus. No injury.

EXAM:
LEFT ANKLE COMPLETE - 3+ VIEW

[Series 1: dg ankle complete left · 0.14mm/px · 3 of 3 slices shown]
[im 1/3]
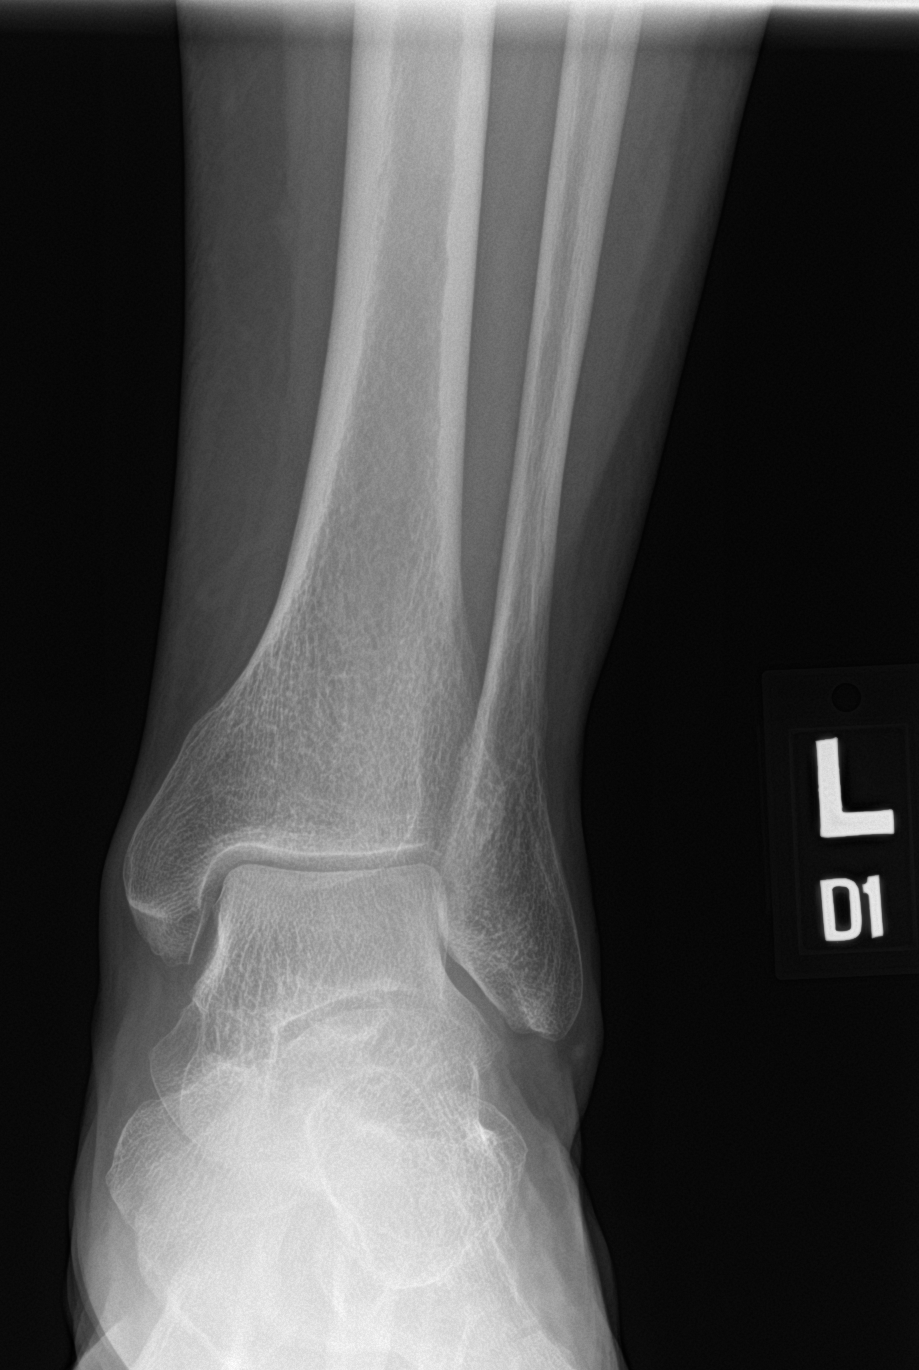
[im 2/3]
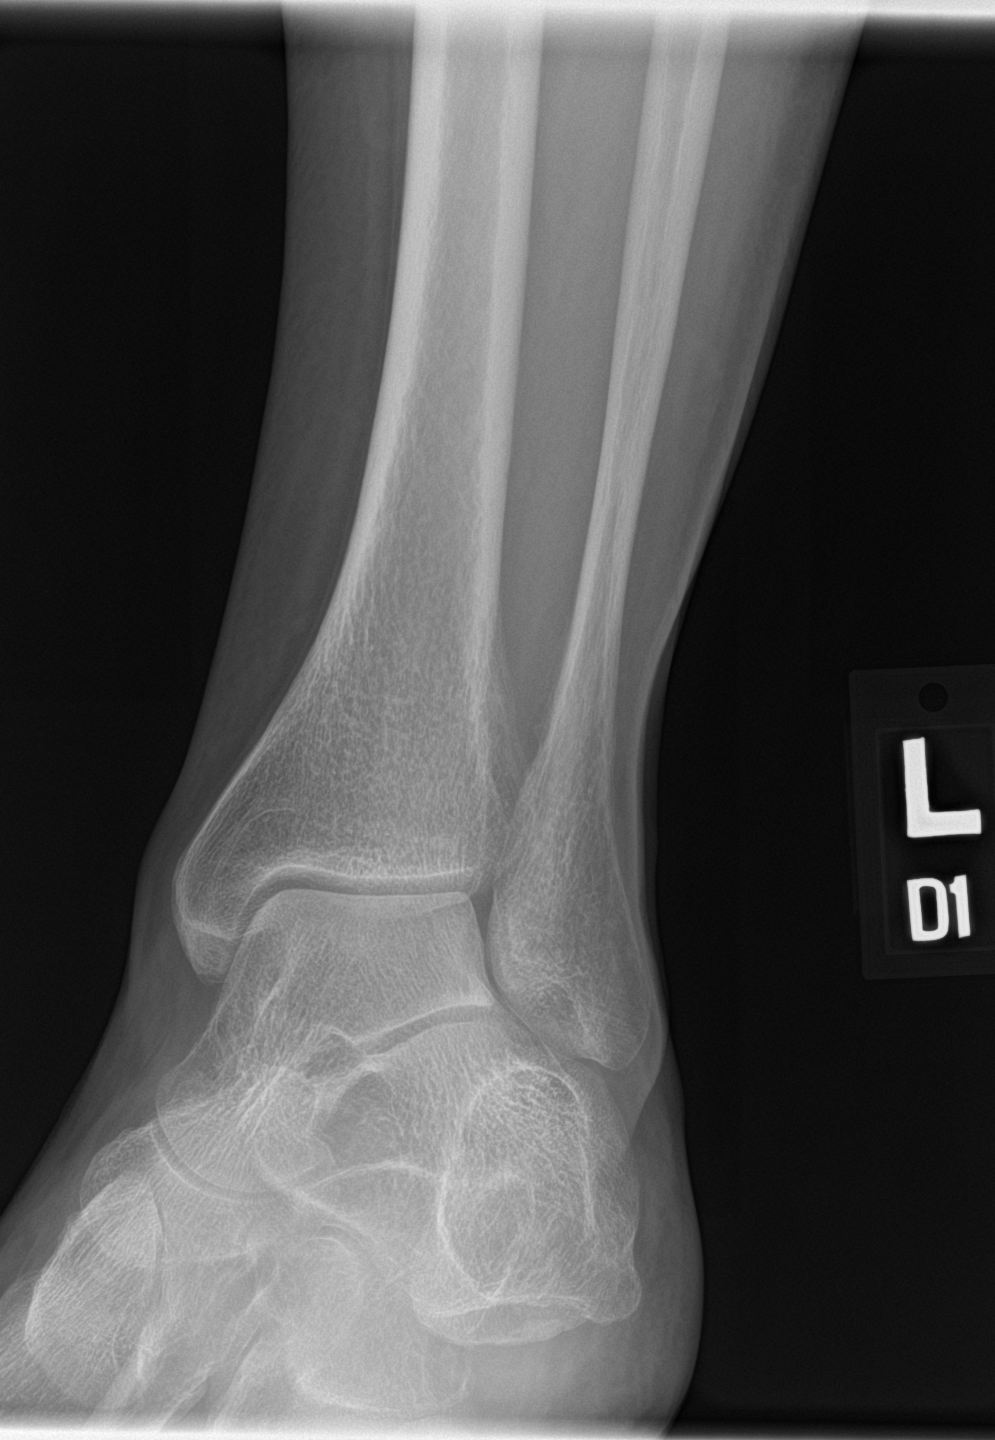
[im 3/3]
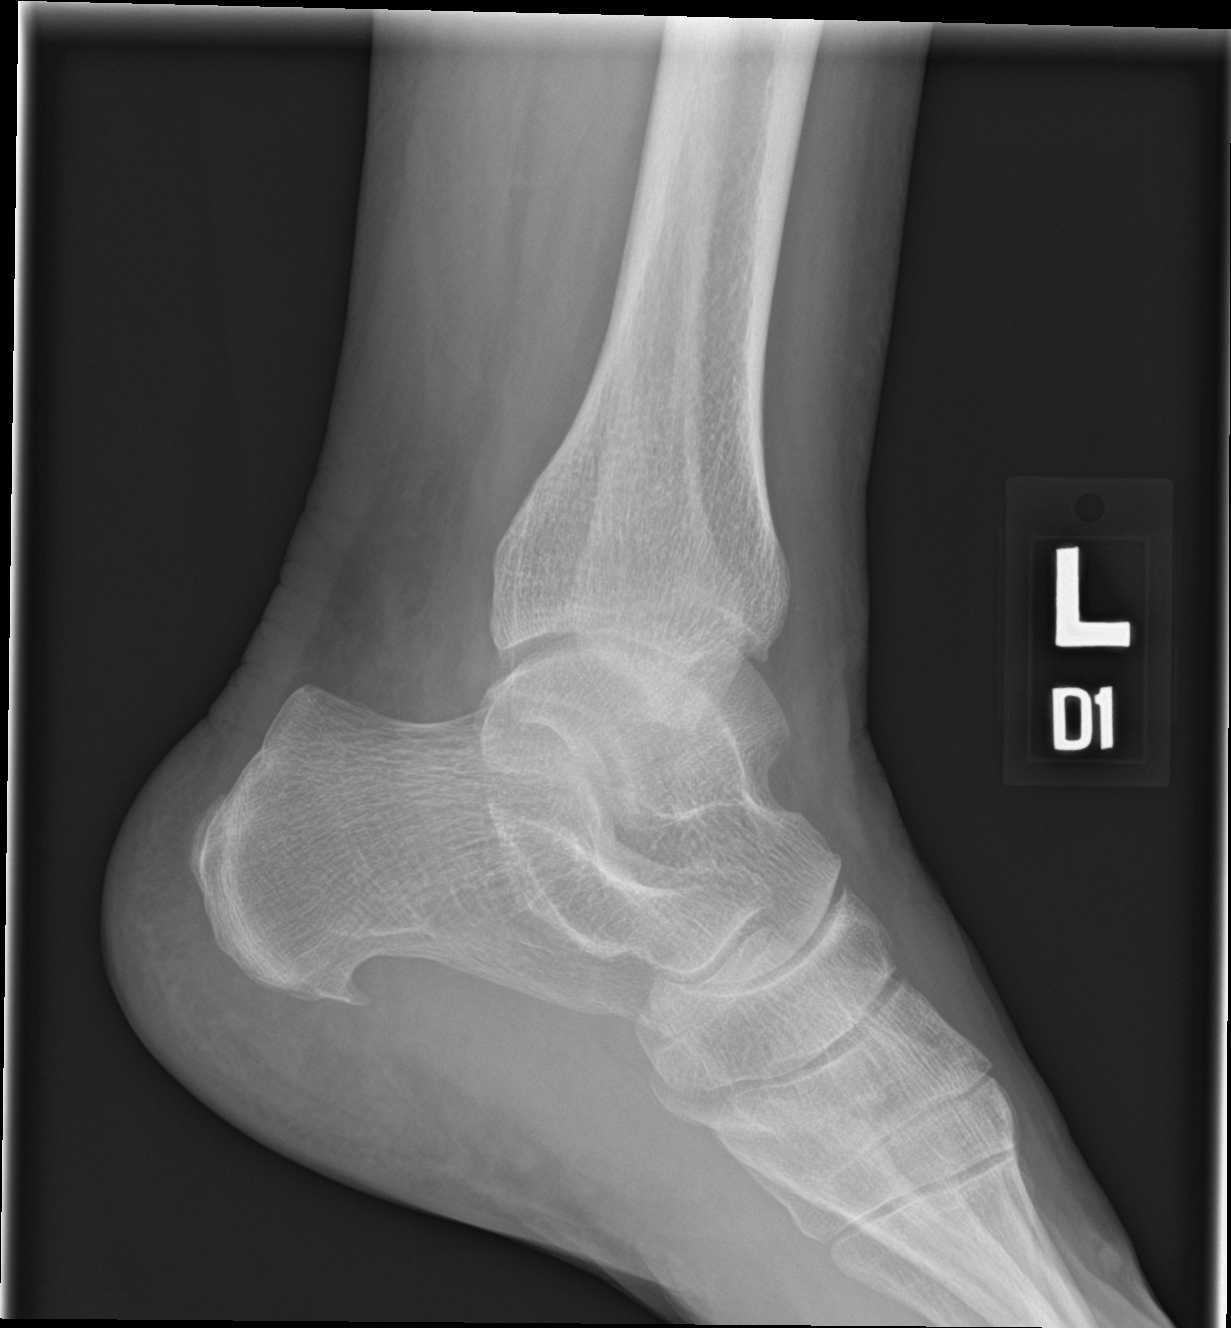

[3 of 3 positions shown; findings below may reference images not displayed]

FINDINGS: There is no evidence of fracture, dislocation, or joint effusion.
There is no evidence of arthropathy or other focal bone abnormality.
Soft tissues are unremarkable. Small plantar calcaneal enthesophyte.
IMPRESSION: No fracture, dislocation or arthropathy of the left ankle.

## 2020-11-30 LAB — IRON AND TIBC
Iron Saturation: 24 % (ref 15–55)
Iron: 73 ug/dL (ref 27–139)
Total Iron Binding Capacity: 310 ug/dL (ref 250–450)
UIBC: 237 ug/dL (ref 118–369)

## 2020-11-30 LAB — SPECIMEN STATUS REPORT

## 2020-12-01 ENCOUNTER — Ambulatory Visit: Payer: Medicare HMO | Admitting: Anesthesiology

## 2020-12-01 ENCOUNTER — Ambulatory Visit: Payer: Medicare HMO

## 2020-12-01 ENCOUNTER — Encounter: Payer: Self-pay | Admitting: Gastroenterology

## 2020-12-01 ENCOUNTER — Telehealth: Payer: Self-pay | Admitting: Gastroenterology

## 2020-12-01 ENCOUNTER — Encounter
Admission: RE | Disposition: A | Discharge status: Home / Self Care | Payer: Self-pay | Source: Home / Self Care | Attending: Gastroenterology

## 2020-12-01 ENCOUNTER — Ambulatory Visit
Admission: RE | Admit: 2020-12-01 | Discharge: 2020-12-01 | Disposition: A | Discharge status: Home / Self Care | Payer: Medicare HMO | Attending: Gastroenterology | Admitting: Gastroenterology

## 2020-12-01 DIAGNOSIS — K633 Ulcer of intestine: Secondary | ICD-10-CM

## 2020-12-01 DIAGNOSIS — K2289 Other specified disease of esophagus: Secondary | ICD-10-CM

## 2020-12-01 DIAGNOSIS — K2270 Barrett's esophagus without dysplasia: Secondary | ICD-10-CM | POA: Diagnosis not present

## 2020-12-01 DIAGNOSIS — Z801 Family history of malignant neoplasm of trachea, bronchus and lung: Secondary | ICD-10-CM | POA: Diagnosis not present

## 2020-12-01 DIAGNOSIS — K5730 Diverticulosis of large intestine without perforation or abscess without bleeding: Secondary | ICD-10-CM | POA: Insufficient documentation

## 2020-12-01 DIAGNOSIS — K219 Gastro-esophageal reflux disease without esophagitis: Secondary | ICD-10-CM

## 2020-12-01 DIAGNOSIS — K2950 Unspecified chronic gastritis without bleeding: Secondary | ICD-10-CM | POA: Insufficient documentation

## 2020-12-01 DIAGNOSIS — K921 Melena: Secondary | ICD-10-CM | POA: Diagnosis not present

## 2020-12-01 DIAGNOSIS — Z79899 Other long term (current) drug therapy: Secondary | ICD-10-CM | POA: Insufficient documentation

## 2020-12-01 DIAGNOSIS — D649 Anemia, unspecified: Secondary | ICD-10-CM | POA: Diagnosis not present

## 2020-12-01 DIAGNOSIS — Z833 Family history of diabetes mellitus: Secondary | ICD-10-CM | POA: Insufficient documentation

## 2020-12-01 DIAGNOSIS — I10 Essential (primary) hypertension: Secondary | ICD-10-CM | POA: Insufficient documentation

## 2020-12-01 DIAGNOSIS — K648 Other hemorrhoids: Secondary | ICD-10-CM | POA: Insufficient documentation

## 2020-12-01 DIAGNOSIS — R1310 Dysphagia, unspecified: Secondary | ICD-10-CM | POA: Diagnosis not present

## 2020-12-01 DIAGNOSIS — D500 Iron deficiency anemia secondary to blood loss (chronic): Secondary | ICD-10-CM | POA: Insufficient documentation

## 2020-12-01 DIAGNOSIS — R195 Other fecal abnormalities: Secondary | ICD-10-CM

## 2020-12-01 DIAGNOSIS — Z8249 Family history of ischemic heart disease and other diseases of the circulatory system: Secondary | ICD-10-CM | POA: Diagnosis not present

## 2020-12-01 DIAGNOSIS — K625 Hemorrhage of anus and rectum: Secondary | ICD-10-CM | POA: Insufficient documentation

## 2020-12-01 DIAGNOSIS — K317 Polyp of stomach and duodenum: Secondary | ICD-10-CM | POA: Diagnosis not present

## 2020-12-01 DIAGNOSIS — Z881 Allergy status to other antibiotic agents status: Secondary | ICD-10-CM | POA: Diagnosis not present

## 2020-12-01 DIAGNOSIS — K649 Unspecified hemorrhoids: Secondary | ICD-10-CM | POA: Diagnosis not present

## 2020-12-01 DIAGNOSIS — D131 Benign neoplasm of stomach: Secondary | ICD-10-CM | POA: Diagnosis not present

## 2020-12-01 HISTORY — PX: ESOPHAGOGASTRODUODENOSCOPY (EGD) WITH PROPOFOL: SHX5813

## 2020-12-01 HISTORY — PX: COLONOSCOPY WITH PROPOFOL: SHX5780

## 2020-12-01 HISTORY — DX: Dislocation of jaw, unspecified side, initial encounter: S03.00XA

## 2020-12-01 SURGERY — COLONOSCOPY WITH PROPOFOL
Anesthesia: General

## 2020-12-01 MED ORDER — HYDROCORTISONE ACETATE 25 MG RE SUPP: 25.0000 mg | Freq: Two times a day (BID) | RECTAL | 0 refills | Status: DC

## 2020-12-01 MED ORDER — SODIUM CHLORIDE 0.9 % IV SOLN: INTRAVENOUS | Status: DC

## 2020-12-01 MED ORDER — LIDOCAINE HCL (CARDIAC) PF 100 MG/5ML IV SOSY
PREFILLED_SYRINGE | INTRAVENOUS | Status: DC | PRN
  Administered 2020-12-01: 40 mg via INTRAVENOUS

## 2020-12-01 MED ORDER — OMEPRAZOLE 20 MG PO CPDR: 1.0000 | DELAYED_RELEASE_CAPSULE | Freq: Every day | ORAL | Status: DC

## 2020-12-01 MED ORDER — PROPOFOL 500 MG/50ML IV EMUL
INTRAVENOUS | Status: DC | PRN
  Administered 2020-12-01: 120 ug/kg/min via INTRAVENOUS

## 2020-12-01 MED ORDER — PROPOFOL 500 MG/50ML IV EMUL
INTRAVENOUS | Status: AC
  Filled 2020-12-01: qty 50

## 2020-12-01 MED ORDER — LIDOCAINE HCL (PF) 2 % IJ SOLN
INTRAMUSCULAR | Status: AC
  Filled 2020-12-01: qty 2

## 2020-12-01 NOTE — Telephone Encounter (Signed)
Patient hydrocortisone Suppository is over a 100 dollars and she can not afford that medication. Please advise what alternative can be called in

## 2020-12-01 NOTE — Transfer of Care (Signed)
Immediate Anesthesia Transfer of Care Note  Patient: Jocelyn Harris  Procedure(s) Performed: COLONOSCOPY WITH PROPOFOL ESOPHAGOGASTRODUODENOSCOPY (EGD) WITH PROPOFOL  Patient Location: PACU  Anesthesia Type:General  Level of Consciousness: awake and sedated  Airway & Oxygen Therapy: Patient Spontanous Breathing and Patient connected to nasal cannula oxygen  Post-op Assessment: Report given to RN and Post -op Vital signs reviewed and stable  Post vital signs: Reviewed and stable  Last Vitals:  Vitals Value Taken Time  BP    Temp    Pulse    Resp    SpO2      Last Pain:  Vitals:   12/01/20 0652  TempSrc: Temporal  PainSc: 0-No pain         Complications: No notable events documented.

## 2020-12-01 NOTE — Anesthesia Preprocedure Evaluation (Addendum)
Anesthesia Evaluation  Patient identified by MRN, date of birth, ID band Patient awake    Reviewed: Allergy & Precautions, NPO status , Patient's Chart, lab work & pertinent test results  Airway Mallampati: II  TM Distance: >3 FB Neck ROM: Full    Dental no notable dental hx.    Pulmonary neg pulmonary ROS,    Pulmonary exam normal        Cardiovascular hypertension, negative cardio ROS Normal cardiovascular exam     Neuro/Psych PSYCHIATRIC DISORDERS Anxiety Depression negative neurological ROS     GI/Hepatic Neg liver ROS, GERD  Medicated,  Endo/Other  negative endocrine ROS  Renal/GU negative Renal ROS  negative genitourinary   Musculoskeletal negative musculoskeletal ROS (+)   Abdominal   Peds negative pediatric ROS (+)  Hematology negative hematology ROS (+)   Anesthesia Other Findings   Reproductive/Obstetrics negative OB ROS                           Anesthesia Physical Anesthesia Plan  ASA: 2  Anesthesia Plan: General   Post-op Pain Management:    Induction: Intravenous  PONV Risk Score and Plan: 2 and Propofol infusion and TIVA  Airway Management Planned: Natural Airway and Nasal Cannula  Additional Equipment:   Intra-op Plan:   Post-operative Plan:   Informed Consent: I have reviewed the patients History and Physical, chart, labs and discussed the procedure including the risks, benefits and alternatives for the proposed anesthesia with the patient or authorized representative who has indicated his/her understanding and acceptance.       Plan Discussed with: CRNA, Anesthesiologist and Surgeon  Anesthesia Plan Comments:         Anesthesia Quick Evaluation

## 2020-12-01 NOTE — Telephone Encounter (Signed)
Medication was expensive and not covered by her insurance.  Is there a cheaper alternative?

## 2020-12-01 NOTE — H&P (Signed)
[name], MD [address], [place], [place], [place], [address] [address], [place], [place], [place], [address] Phone: [phone]  Fax: [phone]  Primary Care Physician:  Jerrol Banana., MD   Pre-Procedure History & Physical: HPI:  Jocelyn Harris is a 70 y.o. female is here for a colonoscopy and EGD.   Past Medical History:  Diagnosis Date   GERD (gastroesophageal reflux disease)    Hypertension    TMJ (dislocation of temporomandibular joint)     Past Surgical History:  Procedure Laterality Date   KNEE SURGERY Right    TOE SURGERY     TUBAL LIGATION      Prior to Admission medications   Medication Sig Start Date End Date Taking? Authorizing Provider  Biotin 1 MG CAPS Take by mouth.   Yes [provider]  busPIRone (BUSPAR) 10 MG tablet TAKE 1 TABLET(10 MG) BY MOUTH THREE TIMES DAILY 09/30/20  Yes Jerrol Banana., MD  calcium gluconate 500 MG tablet Take 1 tablet by mouth daily.    Yes [provider]  cetirizine (ZYRTEC) 10 MG tablet Take 1 tablet (10 mg total) by mouth daily. 11/03/20  Yes Chrismon, Vickki Muff, PA-C  fluticasone (FLONASE) 50 MCG/ACT nasal spray SHAKE LIQUID AND USE 2 SPRAYS IN EACH NOSTRIL DAILY 06/30/20  Yes Jerrol Banana., MD  gabapentin (NEURONTIN) 100 MG capsule Take 1 capsule (100 mg total) by mouth 3 (three) times daily. 07/05/20  Yes Bacigalupo, Dionne Bucy, MD  hydrochlorothiazide (HYDRODIURIL) 25 MG tablet Take 1 tablet (25 mg total) by mouth daily. 03/08/20  Yes Jerrol Banana., MD  lisinopril (ZESTRIL) 40 MG tablet TAKE 1 TABLET(40 MG) BY MOUTH DAILY 03/15/20  Yes Jerrol Banana., MD  Lysine 500 MG TABS Take by mouth as needed.    Yes [provider]  montelukast (SINGULAIR) 10 MG tablet TAKE 1 TABLET BY MOUTH AT BEDTIME 09/29/20  Yes Jerrol Banana., MD  MULTIPLE VITAMINS PO Take by mouth daily.    Yes [provider]  Omega-3 Fatty Acids (FISH OIL) 1000 MG  CAPS Take by mouth daily.    Yes [provider]  omeprazole (PRILOSEC) 20 MG capsule TAKE ONE CAPSULE BY MOUTH TWICE DAILY BEFORE MEALS 12/07/19  Yes Jerrol Banana., MD  Probiotic Product (PROBIOTIC ADVANCED PO) Take by mouth.   Yes [provider]  sucralfate (CARAFATE) 1 g tablet TAKE 1 TABLET BY MOUTH FOUR TIMES DAILY BEFORE A MEAL AND AT BEDTIME 05/07/20  Yes Jerrol Banana., MD  vitamin E 100 UNIT capsule Take 100 Units by mouth daily.    Yes [provider]  Cholecalciferol 1000 UNITS capsule Take 1,000 Units by mouth daily.  Patient not taking: Reported on 12/01/2020    [provider]  ibuprofen (ADVIL) 800 MG tablet TAKE 1 TABLET BY MOUTH TWICE DAILY AS NEEDED 09/19/20   Jerrol Banana., MD  psyllium (REGULOID) 0.52 g capsule Take 0.52 g by mouth daily. Patient not taking: Reported on 12/01/2020    [provider]  traZODone (DESYREL) 150 MG tablet TAKE 1 TABLET(150 MG) BY MOUTH AT BEDTIME 07/13/20   Jerrol Banana., MD    Allergies as of 11/24/2020 - Review Complete 11/24/2020  Allergen Reaction Noted   Augmentin [amoxicillin-pot clavulanate] Nausea Only 06/07/2016   Doxycycline Nausea Only and Nausea And Vomiting 01/27/2015    Family History  Problem Relation Age of Onset  Diabetes Mother    Hypertension Mother    Congestive Heart Failure Mother    Hypertension Father    Lung cancer Father    Fibromyalgia Sister    Lupus Sister    Cancer Sister        of blood   Healthy Daughter     Social History   Socioeconomic History   Marital status: Divorced    Spouse name: Not on file   Number of children: 1   Years of education: Not on file   Highest education level: Some college, no degree  Occupational History   Occupation: customer service    Comment: part time  Tobacco Use   Smoking status: Never   Smokeless tobacco: Never  Vaping Use   Vaping Use: Never used  Substance and Sexual Activity    Alcohol use: No    Alcohol/week: 0.0 standard drinks   Drug use: No   Sexual activity: Not Currently    Birth control/protection: None  Other Topics Concern   Not on file  Social History Narrative   Not on file   Social Determinants of Health   Financial Resource Strain: Not on file  Food Insecurity: Not on file  Transportation Needs: Not on file  Physical Activity: Not on file  Stress: Not on file  Social Connections: Not on file  Intimate Partner Violence: Not on file    Review of Systems: See HPI, otherwise negative ROS  Physical Exam: BP 124/63   Pulse 62   Temp (!) 96.7 F (35.9 C) (Temporal)   Resp 16   Ht 5\' 1"  (1.549 m)   Wt 54.4 kg   SpO2 100%   BMI 22.67 kg/m  General:   Alert,  pleasant and cooperative in NAD Head:  Normocephalic and atraumatic. Neck:  Supple; no masses or thyromegaly. Lungs:  Clear throughout to auscultation, normal respiratory effort.    Heart:  +S1, +S2, Regular rate and rhythm, No edema. Abdomen:  Soft, nontender and nondistended. Normal bowel sounds, without guarding, and without rebound.   Neurologic:  Alert and  oriented x4;  grossly normal neurologically.  Impression/Plan: Jocelyn Harris is here for a colonoscopy to be performed for positive FOBT and EGD for melena and anemia  Risks, benefits, limitations, and alternatives regarding the procedures have been reviewed with the patient.  Questions have been answered.  All parties agreeable.   Virgel Manifold, MD  12/01/2020, 7:36 AM

## 2020-12-01 NOTE — Op Note (Signed)
[facility] Gastroenterology Patient Name: Jocelyn Harris Procedure Date: 12/01/2020 7:41 AM MRN: [id number] Account #: [id number] Date of Birth: 1950-12-21 Admit Type: Outpatient Age: 70 Room: [facility] ENDO ROOM 2 Gender: Female Note Status: Finalized Procedure:             Upper GI endoscopy Indications:           Iron deficiency anemia, Dysphagia, Melena Providers:             Skyleen Bentley B. Bonna Gains MD, MD Referring MD:          Janine Ores. Rosanna Randy, MD (Referring MD) Medicines:             Monitored Anesthesia Care Complications:         No immediate complications. Procedure:             Pre-Anesthesia Assessment:                        - Prior to the procedure, a History and Physical was                         performed, and patient medications, allergies and                         sensitivities were reviewed. The patient's tolerance                         of previous anesthesia was reviewed.                        - The risks and benefits of the procedure and the                         sedation options and risks were discussed with the                         patient. All questions were answered and informed                         consent was obtained.                        - Patient identification and proposed procedure were                         verified prior to the procedure by the physician, the                         nurse, the anesthesiologist, the anesthetist and the                         technician. The procedure was verified in the                         procedure room.                        - ASA Grade Assessment: II - A patient with mild  systemic disease.                        After obtaining informed consent, the endoscope was                         passed under direct vision. Throughout the procedure,                         the patient's blood pressure, pulse, and oxygen                         saturations were  monitored continuously. The Endoscope                         was introduced through the mouth, and advanced to the                         second part of duodenum. The upper GI endoscopy was                         accomplished with ease. The patient tolerated the                         procedure well. Findings:      1 island of salmon-colored mucosa was present. No other visible       abnormalities were present. Biopsies were taken with a cold forceps for       histology.      The exam of the esophagus was otherwise normal.      The examined esophagus was normal. Biopsies were obtained from the       proximal and distal esophagus with cold forceps for histology of       suspected eosinophilic esophagitis.      There is no endoscopic evidence of stenosis or stricture in the entire       esophagus.      Multiple 3 to 5 mm sessile polyps with no bleeding and no stigmata of       recent bleeding were found in the gastric fundus and in the gastric       body. Biopsies were taken with a cold forceps for histology.      The exam of the stomach was otherwise normal.      The entire examined stomach was normal. Biopsies were obtained in the       gastric body, at the incisura and in the gastric antrum with cold       forceps for histology. Biopsies were taken with a cold forceps for       Helicobacter pylori testing.      The duodenal bulb, second portion of the duodenum and examined duodenum       were normal. Biopsies for histology were taken with a cold forceps for       evaluation of celiac disease. Impression:            - Salmon-colored mucosa suspicious for short-segment                         Barrett's esophagus. Biopsied.                        -  Normal esophagus. Biopsied.                        - Multiple gastric polyps. Biopsied.                        - Normal stomach. Biopsied.                        - Normal duodenal bulb, second portion of the duodenum                          and examined duodenum. Biopsied.                        - Biopsies were obtained in the gastric body, at the                         incisura and in the gastric antrum. Recommendation:        - Await pathology results.                        - Discharge patient to home (with escort).                        - Advance diet as tolerated.                        - Continue present medications.                        - Patient has a contact number available for                         emergencies. The signs and symptoms of potential                         delayed complications were discussed with the patient.                         Return to normal activities tomorrow. Written                         discharge instructions were provided to the patient.                        - Discharge patient to home (with escort).                        - The findings and recommendations were discussed with                         the patient.                        - The findings and recommendations were discussed with                         the patient's family. Procedure Code(s):     --- Professional ---  48347, Esophagogastroduodenoscopy, flexible,                         transoral; with biopsy, single or multiple Diagnosis Code(s):     --- Professional ---                        K22.8, Other specified diseases of esophagus                        K31.7, Polyp of stomach and duodenum                        D50.9, Iron deficiency anemia, unspecified                        R13.10, Dysphagia, unspecified                        K92.1, Melena (includes Hematochezia) CPT copyright 2019 [employer]. All rights reserved. The codes documented in this report are preliminary and upon coder review may  be revised to meet current compliance requirements.  [name], MD Margretta Sidle B. Bonna Gains MD, MD 12/01/2020 8:02:38 AM This report has been signed  electronically. Number of Addenda: 0 Note Initiated On: 12/01/2020 7:41 AM Estimated Blood Loss:  Estimated blood loss: none.      [facility]

## 2020-12-01 NOTE — Anesthesia Postprocedure Evaluation (Signed)
Anesthesia Post Note  Patient: Jocelyn Harris  Procedure(s) Performed: COLONOSCOPY WITH PROPOFOL ESOPHAGOGASTRODUODENOSCOPY (EGD) WITH PROPOFOL  Patient location during evaluation: Phase II Anesthesia Type: General Level of consciousness: awake and alert, awake and oriented Pain management: pain level controlled Vital Signs Assessment: post-procedure vital signs reviewed and stable Respiratory status: spontaneous breathing, nonlabored ventilation and respiratory function stable Cardiovascular status: blood pressure returned to baseline and stable Postop Assessment: no apparent nausea or vomiting Anesthetic complications: no   No notable events documented.   Last Vitals:  Vitals:   12/01/20 0835 12/01/20 0845  BP:  (!) 119/57  Pulse:    Resp:    Temp: (!) 36 C   SpO2:      Last Pain:  Vitals:   12/01/20 0905  TempSrc:   PainSc: 0-No pain                 Phill Mutter

## 2020-12-01 NOTE — Op Note (Signed)
[facility] Gastroenterology Patient Name: Feige Lowdermilk Procedure Date: 12/01/2020 7:39 AM MRN: [id number] Account #: [id number] Date of Birth: [birth year] Admit Type: Outpatient Age: 70 Room: [facility] ENDO ROOM 2 Gender: Female Note Status: Finalized Procedure:             Colonoscopy Indications:           Gastrointestinal occult blood loss, Rectal bleeding Providers:             Jlynn Langille B. Bonna Gains MD, MD Referring MD:          Janine Ores. Rosanna Randy, MD (Referring MD) Medicines:             Monitored Anesthesia Care Complications:         No immediate complications. Procedure:             Pre-Anesthesia Assessment:                        - Prior to the procedure, a History and Physical was                         performed, and patient medications, allergies and                         sensitivities were reviewed. The patient's tolerance                         of previous anesthesia was reviewed.                        - The risks and benefits of the procedure and the                         sedation options and risks were discussed with the                         patient. All questions were answered and informed                         consent was obtained.                        - Patient identification and proposed procedure were                         verified prior to the procedure by the physician, the                         nurse, the anesthetist and the technician. The                         procedure was verified in the pre-procedure area in                         the procedure room in the endoscopy suite.                        - ASA Grade Assessment: II - A patient with mild  systemic disease.                        - After reviewing the risks and benefits, the patient                         was deemed in satisfactory condition to undergo the                         procedure.                        After obtaining informed  consent, the colonoscope was                         passed under direct vision. Throughout the procedure,                         the patient's blood pressure, pulse, and oxygen                         saturations were monitored continuously. The                         Colonoscope was introduced through the anus and                         advanced to the the terminal ileum. The colonoscopy                         was performed with ease. The patient tolerated the                         procedure well. The quality of the bowel preparation                         was good. Findings:      The perianal and digital rectal examinations were normal.      The terminal ileum contained a single (solitary) four mm ulcer. No       bleeding was present. No stigmata of recent bleeding were seen.      The remainder of the exam in the terminal ileum was normal.      Multiple diverticula were found in the sigmoid colon.      The exam was otherwise without abnormality.      The rectum, sigmoid colon, descending colon, transverse colon, ascending       colon and cecum appeared normal.      Non-bleeding internal hemorrhoids were found during retroflexion. Impression:            - A single (solitary) ulcer in the terminal ileum.                        - Diverticulosis in the sigmoid colon.                        - The examination was otherwise normal.                        - The rectum, sigmoid colon, descending colon,  transverse colon, ascending colon and cecum are normal.                        - Non-bleeding internal hemorrhoids.                        - No specimens collected. Recommendation:        - Solitary ulcer seen in the terminal ileum may be due                         to patient's NSAID use in the recent past. Follow up                         in clinic as scheduled and if anemia continues despite                         avoiding excessive NSAID use consider small  bowel                         capsule study.                        - Pt has not had any further melena. Intermittent                         BRBPR is likely due to internal hemorrhoids.                        - High fiber diet.                        - Use hydrocortisone suppository 25 mg 2 per rectum                         once a day for 1 week.                        - Discharge patient to home.                        - Resume previous diet.                        - Continue present medications.                        - Repeat colonoscopy in 10 years for screening                         purposes.                        - Return to primary care physician as previously                         scheduled.                        - The findings and recommendations were discussed with  the patient.                        - The findings and recommendations were discussed with                         the patient's family.                        - Avoid NSAIDs except Aspirin if medically indicated                         by PCP Procedure Code(s):     --- Professional ---                        [phone], Colonoscopy, flexible; diagnostic, including                         collection of specimen(s) by brushing or washing, when                         performed (separate procedure) Diagnosis Code(s):     --- Professional ---                        K63.3, Ulcer of intestine                        K64.8, Other hemorrhoids                        R19.5, Other fecal abnormalities                        K62.5, Hemorrhage of anus and rectum                        K57.30, Diverticulosis of large intestine without                         perforation or abscess without bleeding CPT copyright 2019 [employer] Association. All rights reserved. The codes documented in this report are preliminary and upon coder review may  be revised to meet current compliance requirements.   [name], MD Margretta Sidle B. Bonna Gains MD, MD 12/01/2020 8:40:26 AM This report has been signed electronically. Number of Addenda: 0 Note Initiated On: 12/01/2020 7:39 AM Scope Withdrawal Time: 0 hours 15 minutes 51 seconds  Total Procedure Duration: 0 hours 23 minutes 53 seconds  Estimated Blood Loss:  Estimated blood loss: none.      [facility]

## 2020-12-01 NOTE — Anesthesia Procedure Notes (Signed)
Date/Time: 12/01/2020 7:44 AM Performed by: Vaughan Sine Pre-anesthesia Checklist: Patient identified, Emergency Drugs available, Suction available, Patient being monitored and Timeout performed Patient Re-evaluated:Patient Re-evaluated prior to induction Oxygen Delivery Method: Nasal cannula Preoxygenation: Pre-oxygenation with 100% oxygen Induction Type: IV induction Airway Equipment and Method: Bite block Placement Confirmation: CO2 detector and positive ETCO2

## 2020-12-02 ENCOUNTER — Encounter: Payer: Self-pay | Admitting: Gastroenterology

## 2020-12-02 ENCOUNTER — Other Ambulatory Visit: Payer: Self-pay | Admitting: Gastroenterology

## 2020-12-02 MED ORDER — HYDROCORTISONE (PERIANAL) 2.5 % EX CREA: 1.0000 "application " | TOPICAL_CREAM | Freq: Two times a day (BID) | CUTANEOUS | 0 refills | Status: AC

## 2020-12-02 NOTE — Telephone Encounter (Signed)
Informed patient by a detail message that cream was sent in to the pharmacy

## 2020-12-05 ENCOUNTER — Other Ambulatory Visit: Payer: Self-pay | Admitting: Family Medicine

## 2020-12-05 DIAGNOSIS — I10 Essential (primary) hypertension: Secondary | ICD-10-CM

## 2020-12-05 LAB — SURGICAL PATHOLOGY

## 2020-12-14 ENCOUNTER — Ambulatory Visit: Payer: Medicare HMO | Admitting: Family Medicine

## 2020-12-15 ENCOUNTER — Other Ambulatory Visit: Payer: Self-pay | Admitting: Family Medicine

## 2020-12-15 DIAGNOSIS — K219 Gastro-esophageal reflux disease without esophagitis: Secondary | ICD-10-CM

## 2020-12-15 NOTE — Telephone Encounter (Signed)
Requested medication (s) are due for refill today: Yes  Requested medication (s) are on the active medication list: Yes  Last refill:  2 weeks ago  Future visit scheduled: Yes  Notes to clinic:  Unable to refill per protocol, last refill by another provider.      Requested Prescriptions  Pending Prescriptions Disp Refills   omeprazole (PRILOSEC) 20 MG capsule [Pharmacy Med Name: OMEPRAZOLE 20MG  CAPSULES] 180 capsule     Sig: TAKE 1 CAPSULE BY MOUTH TWICE DAILY BEFORE MEALS      Gastroenterology: Proton Pump Inhibitors Passed - 12/15/2020 10:24 AM      Passed - Valid encounter within last 12 months    Recent Outpatient Visits           1 month ago Seasonal allergic rhinitis due to other allergic trigger   [place], PA-C   4 months ago Primary hypertension   [facility] Jerrol Banana., MD   5 months ago Herpes zoster without complication   [facility] Jerrol Banana., MD   5 months ago Herpes zoster without complication   [facility] [place], Dionne Bucy, MD   8 months ago Excessive cerumen in left ear canal   [facility] Jerrol Banana., MD       Future Appointments             In 1 week Jerrol Banana., MD [facility], [place]   In 3 weeks Virgel Manifold, MD [place]

## 2020-12-22 ENCOUNTER — Other Ambulatory Visit: Payer: Self-pay

## 2020-12-22 ENCOUNTER — Ambulatory Visit (INDEPENDENT_AMBULATORY_CARE_PROVIDER_SITE_OTHER): Payer: Medicare HMO | Admitting: Family Medicine

## 2020-12-22 ENCOUNTER — Encounter: Payer: Self-pay | Admitting: Family Medicine

## 2020-12-22 VITALS — BP 105/46 | HR 61 | Temp 98.4°F | Resp 16 | Ht 61.0 in | Wt 119.0 lb

## 2020-12-22 DIAGNOSIS — D649 Anemia, unspecified: Secondary | ICD-10-CM

## 2020-12-22 DIAGNOSIS — M5442 Lumbago with sciatica, left side: Secondary | ICD-10-CM | POA: Diagnosis not present

## 2020-12-22 DIAGNOSIS — J3089 Other allergic rhinitis: Secondary | ICD-10-CM | POA: Diagnosis not present

## 2020-12-22 DIAGNOSIS — M5441 Lumbago with sciatica, right side: Secondary | ICD-10-CM | POA: Diagnosis not present

## 2020-12-22 DIAGNOSIS — I10 Essential (primary) hypertension: Secondary | ICD-10-CM | POA: Diagnosis not present

## 2020-12-22 DIAGNOSIS — K279 Peptic ulcer, site unspecified, unspecified as acute or chronic, without hemorrhage or perforation: Secondary | ICD-10-CM

## 2020-12-22 MED ORDER — CYCLOBENZAPRINE HCL 10 MG PO TABS: 10.0000 mg | ORAL_TABLET | Freq: Every evening | ORAL | 1 refills | Status: AC | PRN

## 2020-12-22 NOTE — Patient Instructions (Signed)
Stop HCTZ.  Add Metamucil every morning.

## 2020-12-22 NOTE — Progress Notes (Signed)
Established patient visit   Patient: Jocelyn Harris   DOB: June 15, 1951   70 y.o. Female  MRN: [id number] Visit Date: 12/22/2020  Today's healthcare provider: Wilhemena Durie, MD   Chief Complaint  Patient presents with   Follow-up   Subjective    HPI  Patient is chronic problems are all stable.  She does have acute low back pain down both legs that started yesterday with some heavy lifting at work.  She states there was no accident or incident that caused it. She has chronic fatigue chronic reflux and gastritis symptoms and history of hypertension. Hypertension, follow-up  BP Readings from Last 3 Encounters:  12/22/20 (!) 105/46  12/01/20 (!) 119/57  11/24/20 128/68   Wt Readings from Last 3 Encounters:  12/22/20 119 lb (54 kg)  12/01/20 120 lb (54.4 kg)  11/24/20 120 lb 9.6 oz (54.7 kg)     She was last seen for hypertension 2 months ago by [employer], PA.  BP at that visit was 114/50. Management since that visit includes no medication changes. It was noted on last visit that she may need to discontinue amlodipine 5mg . Patient reports that she is still taking this medication.   She reports good compliance with treatment. She is having side effects.  She is following a Regular diet. She is not exercising. She does not smoke.  Use of agents associated with hypertension: none.   Outside blood pressures are checked occasionally. Symptoms: No chest pain No chest pressure  No palpitations No syncope  No dyspnea No orthopnea  No paroxysmal nocturnal dyspnea No lower extremity edema   Pertinent labs: Lab Results  Component Value Date   CHOL 235 (H) 01/28/2020   HDL 58 01/28/2020   LDLCALC 148 (H) 01/28/2020   TRIG 161 (H) 01/28/2020   CHOLHDL 4.1 01/28/2020   Lab Results  Component Value Date   NA 142 11/10/2020   K 3.5 11/10/2020   CREATININE 1.23 (H) 11/10/2020   GFRNONAA 80 01/28/2020   GFRAA 92 01/28/2020   GLUCOSE 96 11/10/2020     The  10-year ASCVD risk score Mikey Bussing DC Jr., et al., 2013) is: 6.6%   Follow up for allergies  The patient was last seen for this 2 months ago. Changes made at last visit include no medication changes. Patient reports that she needs a refill on Singulair 10mg .   She reports good compliance with treatment. She feels that condition is Unchanged. She is having side effects.       Medications: Outpatient Medications Prior to Visit  Medication Sig   hydrochlorothiazide (HYDRODIURIL) 25 MG tablet Take 1 tablet (25 mg total) by mouth daily.   lisinopril (ZESTRIL) 40 MG tablet TAKE 1 TABLET(40 MG) BY MOUTH DAILY   Lysine 500 MG TABS Take by mouth as needed.    montelukast (SINGULAIR) 10 MG tablet TAKE 1 TABLET BY MOUTH AT BEDTIME   MULTIPLE VITAMINS PO Take by mouth daily.    Omega-3 Fatty Acids (FISH OIL) 1000 MG CAPS Take by mouth daily.    omeprazole (PRILOSEC) 20 MG capsule TAKE 1 CAPSULE BY MOUTH TWICE DAILY BEFORE MEALS   Probiotic Product (PROBIOTIC ADVANCED PO) Take by mouth.   sucralfate (CARAFATE) 1 g tablet TAKE 1 TABLET BY MOUTH FOUR TIMES DAILY BEFORE A MEAL AND AT BEDTIME   traZODone (DESYREL) 150 MG tablet TAKE 1 TABLET(150 MG) BY MOUTH AT BEDTIME   vitamin E 100 UNIT capsule Take 100 Units by mouth  daily.    Biotin 1 MG CAPS Take by mouth.   busPIRone (BUSPAR) 10 MG tablet TAKE 1 TABLET(10 MG) BY MOUTH THREE TIMES DAILY   calcium gluconate 500 MG tablet Take 1 tablet by mouth daily.    cetirizine (ZYRTEC) 10 MG tablet Take 1 tablet (10 mg total) by mouth daily.   Cholecalciferol 1000 UNITS capsule Take 1,000 Units by mouth daily.  (Patient not taking: Reported on 12/01/2020)   fluticasone (FLONASE) 50 MCG/ACT nasal spray SHAKE LIQUID AND USE 2 SPRAYS IN EACH NOSTRIL DAILY   gabapentin (NEURONTIN) 100 MG capsule Take 1 capsule (100 mg total) by mouth 3 (three) times daily.   psyllium (REGULOID) 0.52 g capsule Take 0.52 g by mouth daily. (Patient not taking: No sig reported)   No  facility-administered medications prior to visit.    Review of Systems  Constitutional:  Positive for activity change and fatigue.  HENT:  Positive for congestion and postnasal drip.   Respiratory:  Negative for cough and shortness of breath.   Cardiovascular:  Negative for chest pain, palpitations and leg swelling.  Musculoskeletal:  Positive for arthralgias, back pain, joint swelling and myalgias. Negative for neck pain.  Neurological:  Positive for light-headedness. Negative for dizziness and headaches.  Psychiatric/Behavioral:  Negative for self-injury, sleep disturbance and suicidal ideas. The patient is not nervous/anxious.        Objective    BP (!) 105/46   Pulse 61   Temp 98.4 F (36.9 C)   Resp 16   Ht 5\' 1"  (1.549 m)   Wt 119 lb (54 kg)   BMI 22.48 kg/m  BP Readings from Last 3 Encounters:  12/22/20 (!) 105/46  12/01/20 (!) 119/57  11/24/20 128/68   Wt Readings from Last 3 Encounters:  12/22/20 119 lb (54 kg)  12/01/20 120 lb (54.4 kg)  11/24/20 120 lb 9.6 oz (54.7 kg)       Physical Exam Vitals reviewed.  Constitutional:      Appearance: She is well-developed.  HENT:     Head: Normocephalic and atraumatic.     Right Ear: External ear normal.     Left Ear: External ear normal.     Nose: Nose normal.  Eyes:     Conjunctiva/sclera: Conjunctivae normal.     Pupils: Pupils are equal, round, and reactive to light.  Cardiovascular:     Rate and Rhythm: Normal rate and regular rhythm.     Heart sounds: Normal heart sounds.  Pulmonary:     Effort: Pulmonary effort is normal.     Breath sounds: Normal breath sounds.  Abdominal:     General: Bowel sounds are normal.     Palpations: Abdomen is soft.     Tenderness: There is no abdominal tenderness.  Genitourinary:    Rectum: Internal hemorrhoid present.  Musculoskeletal:     Cervical back: Normal range of motion and neck supple.     Right lower leg: No edema.     Left lower leg: No edema.  Skin:     General: Skin is warm and dry.  Neurological:     General: No focal deficit present.     Mental Status: She is alert and oriented to person, place, and time.     Comments: Straight leg raise is negative and strength is normal in both lower extremities  Psychiatric:        Mood and Affect: Mood normal.        Behavior: Behavior normal.  Thought Content: Thought content normal.        Judgment: Judgment normal.      No results found for any visits on 12/22/20.  Assessment & Plan     1. Anemia, unspecified type Check CBC and may need further work-up. - CBC with Differential/Platelet  2. Primary hypertension Stop HCTZ and follow-up in a couple of months - TSH - Comprehensive metabolic panel  3. PUD (peptic ulcer disease) Continue indefinite PPI with omeprazole  4. Seasonal allergic rhinitis due to other allergic trigger Continue Zyrtec daily in addition to montelukast  5. Acute midline low back pain with bilateral sciatica Acute low back strain with some chronic features.  Try Flexeril at bedtime for a while and avoid heavy lifting at work. - Sedimentation rate - cyclobenzaprine (FLEXERIL) 10 MG tablet; Take 1 tablet (10 mg total) by mouth at bedtime as needed for muscle spasms.  Dispense: 30 tablet; Refill: 1   No follow-ups on file.      I, Wilhemena Durie, MD, have reviewed all documentation for this visit. The documentation on 12/28/20 for the exam, diagnosis, procedures, and orders are all accurate and complete.    Weslynn Ke Cranford Mon, MD  [facility] [phone] (phone) [phone] (fax)  [place]

## 2020-12-23 LAB — COMPREHENSIVE METABOLIC PANEL
ALT: 10 IU/L (ref 0–32)
AST: 11 IU/L (ref 0–40)
Albumin/Globulin Ratio: 2.3 — ABNORMAL HIGH (ref 1.2–2.2)
Albumin: 4.6 g/dL (ref 3.8–4.8)
Alkaline Phosphatase: 72 IU/L (ref 44–121)
BUN/Creatinine Ratio: 17 (ref 12–28)
BUN: 14 mg/dL (ref 8–27)
Bilirubin Total: 0.2 mg/dL (ref 0.0–1.2)
CO2: 26 mmol/L (ref 20–29)
Calcium: 9.8 mg/dL (ref 8.7–10.3)
Chloride: 102 mmol/L (ref 96–106)
Creatinine, Ser: 0.81 mg/dL (ref 0.57–1.00)
Globulin, Total: 2 g/dL (ref 1.5–4.5)
Glucose: 83 mg/dL (ref 65–99)
Potassium: 4.2 mmol/L (ref 3.5–5.2)
Sodium: 141 mmol/L (ref 134–144)
Total Protein: 6.6 g/dL (ref 6.0–8.5)
eGFR: 80 mL/min/{1.73_m2} (ref >59)

## 2020-12-23 LAB — CBC WITH DIFFERENTIAL/PLATELET
Basophils Absolute: 0.1 10*3/uL (ref 0.0–0.2)
Basos: 1 %
EOS (ABSOLUTE): 0.1 10*3/uL (ref 0.0–0.4)
Eos: 2 %
Hematocrit: 33.5 % — ABNORMAL LOW (ref 34.0–46.6)
Hemoglobin: 10.9 g/dL — ABNORMAL LOW (ref 11.1–15.9)
Immature Grans (Abs): 0 10*3/uL (ref 0.0–0.1)
Immature Granulocytes: 0 %
Lymphocytes Absolute: 2.6 10*3/uL (ref 0.7–3.1)
Lymphs: 34 %
MCH: 27.9 pg (ref 26.6–33.0)
MCHC: 32.5 g/dL (ref 31.5–35.7)
MCV: 86 fL (ref 79–97)
Monocytes Absolute: 0.5 10*3/uL (ref 0.1–0.9)
Monocytes: 7 %
Neutrophils Absolute: 4.3 10*3/uL (ref 1.4–7.0)
Neutrophils: 56 %
Platelets: 251 10*3/uL (ref 150–450)
RBC: 3.91 x10E6/uL (ref 3.77–5.28)
RDW: 13.3 % (ref 11.7–15.4)
WBC: 7.5 10*3/uL (ref 3.4–10.8)

## 2020-12-23 LAB — SEDIMENTATION RATE: Sed Rate: 3 mm/hr (ref 0–40)

## 2020-12-23 LAB — TSH: TSH: 1.79 u[IU]/mL (ref 0.450–4.500)

## 2020-12-28 ENCOUNTER — Telehealth: Payer: Self-pay

## 2020-12-28 NOTE — Telephone Encounter (Signed)
Called and spoke with patient on the phone and advise her of lab report dated from 12/22/20. Patient wanted to let you know for the past two days she has been experiencing lowe abdominal pain that she describes as burning and cramping. Patient states that pain is usually triggered from eating and states that she noticed immediately after eating a meal she has to go to the restroom. Patient describes bowels as loose but denies being watery, mucous or blood in the stool. Associated with Lower abdominal pain patient states that she has lower back pain as well when she has cramps, she states that she has been taking prescription Sulcrafate with mild relief. Patient had concerns that she might possibly have diverticulitis. Patient denies symptoms of nausea, vomiting, fever, hematuria, near syncope, vaginal bleeding or incontinence. Please advise. KW

## 2020-12-28 NOTE — Telephone Encounter (Signed)
Copied from [place] [id number]. Topic: General - Other >> Dec 27, 2020  2:20 PM Pawlus, Brayton Layman A wrote: Reason for CRM: Pt called back to go over her latest lab results, please advise.

## 2021-01-03 ENCOUNTER — Telehealth: Payer: Self-pay

## 2021-01-03 MED ORDER — PREDNISONE 20 MG PO TABS: 20.0000 mg | ORAL_TABLET | Freq: Every day | ORAL | 0 refills | Status: AC

## 2021-01-03 NOTE — Telephone Encounter (Signed)
Copied from [place]. Topic: General - Other >> Jan 03, 2021  9:15 AM Leward Quan A wrote: Reason for CRM: Patient called in to inform Dr Rosanna Randy that she woke up on the morning of 01/02/21 with her face and under her eyes very puffy. Not sure what it is but wanted to be seen today please because she is still having this issue. Can be reached by nurse at  Ph# [phone]

## 2021-01-03 NOTE — Telephone Encounter (Signed)
Please advise 

## 2021-01-03 NOTE — Telephone Encounter (Signed)
Advised patient. Medication sent into the pharmacy.

## 2021-01-05 ENCOUNTER — Other Ambulatory Visit: Payer: Self-pay

## 2021-01-05 ENCOUNTER — Ambulatory Visit: Payer: Medicare HMO | Admitting: Gastroenterology

## 2021-01-05 ENCOUNTER — Ambulatory Visit: Payer: Self-pay

## 2021-01-05 VITALS — BP 146/67 | HR 53 | Temp 98.1°F | Ht 61.0 in | Wt 121.4 lb

## 2021-01-05 DIAGNOSIS — K219 Gastro-esophageal reflux disease without esophagitis: Secondary | ICD-10-CM

## 2021-01-05 DIAGNOSIS — D649 Anemia, unspecified: Secondary | ICD-10-CM | POA: Diagnosis not present

## 2021-01-05 DIAGNOSIS — K5900 Constipation, unspecified: Secondary | ICD-10-CM

## 2021-01-05 MED ORDER — OMEPRAZOLE 40 MG PO CPDR: 40.0000 mg | DELAYED_RELEASE_CAPSULE | Freq: Two times a day (BID) | ORAL | 0 refills | Status: DC

## 2021-01-05 MED ORDER — POLYETHYLENE GLYCOL 3350 17 GM/SCOOP PO POWD: 17.0000 g | Freq: Every day | ORAL | 1 refills | Status: AC

## 2021-01-05 NOTE — Progress Notes (Signed)
Jocelyn Antigua, MD [address]  [place]  [place], [place] [address]  Main: [phone]  Fax: [phone]   Primary Care Physician: Jocelyn Harris., MD   Chief Complaint  Patient presents with   Follow-up    6 week   Constipation    Has tried high fiber diet... having 2 BM per week... abdominal bloating and cramping... Pt has tried OTC Metamucil, Sennacot with no relief    HPI: Jocelyn Harris is a 70 y.o. female here for follow-up of reflux and is also reporting constipation.  Has tried Metamucil and this has not led to good results.  Does not have a bowel movement every day, but rather every 2 to 3 days and feels bloated due to this.  Denies any nausea or vomiting.  No blood in stool.   Reports reflux symptoms consisting of burning sensation in chest even when she is not eating, and even when she is upright.  Occurring almost daily.  Is on omeprazole twice a day already.  No aggravating or relieving factors.  No associated weight loss.   ROS: All ROS reviewed and negative except as per HPI   Past Medical History:  Diagnosis Date   GERD (gastroesophageal reflux disease)    Hypertension    TMJ (dislocation of temporomandibular joint)     Past Surgical History:  Procedure Laterality Date   COLONOSCOPY WITH PROPOFOL N/A 12/01/2020   Procedure: COLONOSCOPY WITH PROPOFOL;  Surgeon: Virgel Manifold, MD;  Location: [facility] ENDOSCOPY;  Service: Endoscopy;  Laterality: N/A;   ESOPHAGOGASTRODUODENOSCOPY (EGD) WITH PROPOFOL N/A 12/01/2020   Procedure: ESOPHAGOGASTRODUODENOSCOPY (EGD) WITH PROPOFOL;  Surgeon: Virgel Manifold, MD;  Location: [facility] ENDOSCOPY;  Service: Endoscopy;  Laterality: N/A;   KNEE SURGERY Right    TOE SURGERY     TUBAL LIGATION      Prior to Admission medications   Medication Sig Start Date End Date Taking? Authorizing Provider  Biotin 1 MG CAPS Take by mouth.   Yes [provider]  busPIRone (BUSPAR) 10 MG tablet TAKE 1  TABLET(10 MG) BY MOUTH THREE TIMES DAILY 09/30/20  Yes Jocelyn Harris., MD  calcium gluconate 500 MG tablet Take 1 tablet by mouth daily.    Yes [provider]  cetirizine (ZYRTEC) 10 MG tablet Take 1 tablet (10 mg total) by mouth daily. 11/03/20  Yes Chrismon, Vickki Muff, PA-C  Cholecalciferol 1000 UNITS capsule Take 1,000 Units by mouth daily.   Yes [provider]  cyclobenzaprine (FLEXERIL) 10 MG tablet Take 1 tablet (10 mg total) by mouth at bedtime as needed for muscle spasms. 12/22/20  Yes Jocelyn Harris., MD  fluticasone [facility]) 50 MCG/ACT nasal spray SHAKE LIQUID AND USE 2 SPRAYS IN [facility] NOSTRIL DAILY 06/30/20  Yes Jocelyn Harris., MD  gabapentin (NEURONTIN) 100 MG capsule Take 1 capsule (100 mg total) by mouth 3 (three) times daily. 07/05/20  Yes Bacigalupo, Dionne Bucy, MD  hydrochlorothiazide (HYDRODIURIL) 25 MG tablet Take 1 tablet (25 mg total) by mouth daily. 03/08/20  Yes Jocelyn Harris., MD  lisinopril (ZESTRIL) 40 MG tablet TAKE 1 TABLET(40 MG) BY MOUTH DAILY 03/15/20  Yes Jocelyn Harris., MD  Lysine 500 MG TABS Take by mouth as needed.    Yes [provider]  montelukast (SINGULAIR) 10 MG tablet TAKE 1 TABLET BY MOUTH AT BEDTIME 09/29/20  Yes Jocelyn Harris., MD  MULTIPLE VITAMINS PO Take by mouth daily.  Yes [provider]  Omega-3 Fatty Acids (FISH OIL) 1000 MG CAPS Take by mouth daily.    Yes [provider]  predniSONE (DELTASONE) 20 MG tablet Take 1 tablet (20 mg total) by mouth daily with breakfast for 5 days. 01/03/21 01/08/21 Yes Jocelyn Harris., MD  Probiotic Product (PROBIOTIC ADVANCED PO) Take by mouth.   Yes [provider]  psyllium (REGULOID) 0.52 g capsule Take 0.52 g by mouth daily.   Yes [provider]  sucralfate (CARAFATE) 1 g tablet TAKE 1 TABLET BY MOUTH FOUR TIMES DAILY BEFORE A MEAL AND AT BEDTIME 05/07/20  Yes Jocelyn Harris., MD  traZODone  (DESYREL) 150 MG tablet TAKE 1 TABLET(150 MG) BY MOUTH AT BEDTIME 07/13/20  Yes Jocelyn Harris., MD  vitamin E 100 UNIT capsule Take 100 Units by mouth daily.    Yes [provider]  omeprazole (PRILOSEC) 20 MG capsule TAKE 1 CAPSULE BY MOUTH TWICE DAILY BEFORE MEALS 12/15/20 01/05/21 Yes Jocelyn Harris., MD    Family History  Problem Relation Age of Onset   Diabetes Mother    Hypertension Mother    Congestive Heart Failure Mother    Hypertension Father    Lung cancer Father    Fibromyalgia Sister    Lupus Sister    Cancer Sister        of blood   Healthy Daughter      Social History   Tobacco Use   Smoking status: Never   Smokeless tobacco: Never  Vaping Use   Vaping Use: Never used  Substance Use Topics   Alcohol use: No    Alcohol/week: 0.0 standard drinks   Drug use: No    Allergies as of 01/05/2021 - Review Complete 01/05/2021  Allergen Reaction Noted   Augmentin [amoxicillin-pot clavulanate] Nausea Only 06/07/2016   Doxycycline Nausea Only and Nausea And Vomiting 01/27/2015    Physical Examination:  Constitutional: General:   Alert,  Well-developed, well-nourished, pleasant and cooperative in NAD BP (!) 146/67   Pulse (!) 53   Temp 98.1 F (36.7 C) (Oral)   Ht 5\' 1"  (1.549 m)   Wt 121 lb 6.4 oz (55.1 kg)   BMI 22.94 kg/m   Respiratory: Normal respiratory effort  Gastrointestinal:  Soft, non-tender and non-distended without masses, hepatosplenomegaly or hernias noted.  No guarding or rebound tenderness.     Cardiac: No clubbing or edema.  No cyanosis. Normal posterior tibial pedal pulses noted.  Psych:  Alert and cooperative. Normal mood and affect.  Musculoskeletal:  Normal gait. Head normocephalic, atraumatic. Symmetrical without gross deformities. 5/5 Lower extremity strength bilaterally.  Skin: Warm. Intact without significant lesions or rashes. No jaundice.  Neck: Supple, trachea midline  Lymph: No cervical  lymphadenopathy  Psych:  Alert and oriented x3, Alert and cooperative. Normal mood and affect.  Labs: CMP     Component Value Date/Time   NA 141 12/22/2020 1531   K 4.2 12/22/2020 1531   CL 102 12/22/2020 1531   CO2 26 12/22/2020 1531   GLUCOSE 83 12/22/2020 1531   BUN 14 12/22/2020 1531   CREATININE 0.81 12/22/2020 1531   CALCIUM 9.8 12/22/2020 1531   PROT 6.6 12/22/2020 1531   ALBUMIN 4.6 12/22/2020 1531   AST 11 12/22/2020 1531   ALT 10 12/22/2020 1531   ALKPHOS 72 12/22/2020 1531   BILITOT 0.2 12/22/2020 1531   GFRNONAA 80 01/28/2020 1001   GFRAA 92 01/28/2020 1001   Lab Results  Component Value Date   WBC 7.5 12/22/2020   HGB 10.9 (L) 12/22/2020   HCT 33.5 (L) 12/22/2020   MCV 86 12/22/2020   PLT 251 12/22/2020    Imaging Studies:   Assessment and Plan:   DESERIE DIRKS is a 70 y.o. y/o female here for follow-up of reflux and also reporting constipation  Patient continues to report symptoms of reflux.  We will try changing to full dose omeprazole therapy at this time for 30 days.  Symptoms remain uncontrolled, consider pH study, with possible manometry given previous dysphagia with no specific findings on EGD and biopsies  (Risks of PPI use were discussed with patient including bone loss, C. Diff diarrhea, pneumonia, infections, CKD, electrolyte abnormalities.  Pt. Verbalizes understanding and chooses to continue the medication.)  Patient educated extensively on acid reflux lifestyle modification, including buying a bed wedge, not eating 3 hrs before bedtime, diet modifications, and handout given for the same.    Metamucil has not helped with her constipation  High-fiber diet MiraLAX daily with goal of 1-2 soft bowel movements daily.  If not at goal, patient instructed to increase dose to twice daily.  If loose stools with the medication, patient asked to decrease the medication to every other day, or half dose daily.  Patient verbalized  understanding   Patient also noted to have anemia with hemoglobin of 10.9 and 10.4 in July and May 2022.  This was normocytic.   She had iron labs done in May 2022 which showed normal iron and iron saturation.  However, ferritin was not recently done and the last ferritin done was in August 2021 where it was low normal at 15.  I will repeat her CBC, and check ferritin and iron panel, and if abnormal consider hematology referral   She had an isolated terminal ileum ulcer noted on her colonoscopy.  She denies any bleeding or abdominal pain at this time  If she has further anemia on labs, consider small bowel capsule study  Avoid NSAID use such as Ibuprofen, Aleeve, advil, motrin, BC and Goodie powder, Naproxen, Meloxicam and others.     Dr Jocelyn Harris

## 2021-01-05 NOTE — Telephone Encounter (Signed)
Pt. Reports she started Prednisone Tuesday. Has caused insomnia and her BP is up - 147/86 today. States "and my face is still puffy." Requesting advice on what to do. Please advise pt.

## 2021-01-05 NOTE — Telephone Encounter (Signed)
Answer Assessment - Initial Assessment Questions 1. NAME of MEDICATION: "What medicine are you calling about?"     Prednisone 2. QUESTION: "What is your question?" (e.g., double dose of medicine, side effect)     Side effect 3. PRESCRIBING HCP: "Who prescribed it?" Reason: if prescribed by specialist, call should be referred to that group.     Dr. Rosanna Randy 4. SYMPTOMS: "Do you have any symptoms?"     Insomnia, BP UP 147/86 5. SEVERITY: If symptoms are present, ask "Are they mild, moderate or severe?"     Moderate 6. PREGNANCY:  "Is there any chance that you are pregnant?" "When was your last menstrual period?"     No  Protocols used: Medication Question Call-A-AH

## 2021-01-05 NOTE — Telephone Encounter (Signed)
Please advise 

## 2021-01-06 LAB — IRON,TIBC AND FERRITIN PANEL
Ferritin: 24 ng/mL (ref 15–150)
Iron Saturation: 11 % — ABNORMAL LOW (ref 15–55)
Iron: 32 ug/dL (ref 27–139)
Total Iron Binding Capacity: 281 ug/dL (ref 250–450)
UIBC: 249 ug/dL (ref 118–369)

## 2021-01-06 LAB — CBC
Hematocrit: 30.5 % — ABNORMAL LOW (ref 34.0–46.6)
Hemoglobin: 9.8 g/dL — ABNORMAL LOW (ref 11.1–15.9)
MCH: 28 pg (ref 26.6–33.0)
MCHC: 32.1 g/dL (ref 31.5–35.7)
MCV: 87 fL (ref 79–97)
Platelets: 252 10*3/uL (ref 150–450)
RBC: 3.5 x10E6/uL — ABNORMAL LOW (ref 3.77–5.28)
RDW: 13.8 % (ref 11.7–15.4)
WBC: 8.6 10*3/uL (ref 3.4–10.8)

## 2021-01-09 NOTE — Telephone Encounter (Signed)
Patient was advised. Patient stated she has already started allergy medication.

## 2021-01-09 NOTE — Addendum Note (Signed)
Addended by: Lurlean Nanny on: 01/09/2021 04:58 PM   Modules accepted: Orders

## 2021-01-10 ENCOUNTER — Other Ambulatory Visit: Payer: Self-pay | Admitting: Gastroenterology

## 2021-01-10 NOTE — Addendum Note (Signed)
Addended by: Lurlean Nanny on: 01/10/2021 09:01 AM   Modules accepted: Orders

## 2021-01-15 NOTE — Progress Notes (Signed)
[facility]  [phone] [phone]  ID: [name] OB: 06/13/1951  MR#: [id number]  [id number]  Patient Care Team: Jerrol Banana., MD as PCP - General (Family Medicine) Rubie Maid, MD as Referring Physician (Obstetrics and Gynecology)  CHIEF COMPLAINT: Anemia, unspecified.  INTERVAL HISTORY: Patient is a 70 year old female who was noted to have a declining hemoglobin as well as mildly decreased iron stores.  She is being treated for reflux and constipation, both of which have improved.  She admits to increased fatigue, but otherwise feels well.  She has no neurologic complaints.  She denies any recent fevers or illnesses.  She has a good appetite and denies weight loss.  She has no chest pain, shortness of breath, cough, or hemoptysis.  She denies any nausea, vomiting, constipation, or diarrhea.  She has no melena or hematochezia.  She has no urinary complaints.  Patient otherwise feels well and offers no further specific complaints today.  REVIEW OF SYSTEMS:   Review of Systems  Constitutional:  Positive for malaise/fatigue. Negative for fever and weight loss.  Respiratory: Negative.  Negative for cough, hemoptysis and shortness of breath.   Cardiovascular: Negative.  Negative for chest pain and leg swelling.  Gastrointestinal: Negative.  Negative for abdominal pain, blood in stool, constipation, heartburn and melena.  Genitourinary: Negative.  Negative for frequency.  Musculoskeletal: Negative.  Negative for back pain.  Skin: Negative.  Negative for itching and rash.  Neurological: Negative.  Negative for dizziness, speech change, focal weakness and headaches.  Psychiatric/Behavioral: Negative.  The patient is not nervous/anxious.    As per HPI. Otherwise, a complete review of systems is negative.  PAST MEDICAL HISTORY: Past Medical History:  Diagnosis Date   GERD (gastroesophageal reflux disease)    Hypertension    TMJ  (dislocation of temporomandibular joint)     PAST SURGICAL HISTORY: Past Surgical History:  Procedure Laterality Date   COLONOSCOPY WITH PROPOFOL N/A 12/01/2020   Procedure: COLONOSCOPY WITH PROPOFOL;  Surgeon: Virgel Manifold, MD;  Location: [facility] ENDOSCOPY;  Service: Endoscopy;  Laterality: N/A;   ESOPHAGOGASTRODUODENOSCOPY (EGD) WITH PROPOFOL N/A 12/01/2020   Procedure: ESOPHAGOGASTRODUODENOSCOPY (EGD) WITH PROPOFOL;  Surgeon: Virgel Manifold, MD;  Location: [facility] ENDOSCOPY;  Service: Endoscopy;  Laterality: N/A;   KNEE SURGERY Right    TOE SURGERY     TUBAL LIGATION      FAMILY HISTORY: Family History  Problem Relation Age of Onset   Diabetes Mother    Hypertension Mother    Congestive Heart Failure Mother    Hypertension Father    Lung cancer Father    Fibromyalgia Sister    Lupus Sister    Cancer Sister        of blood   Leukemia Paternal Grandmother    Healthy Daughter     ADVANCED DIRECTIVES (Y/N):  N  HEALTH MAINTENANCE: Social History   Tobacco Use   Smoking status: Never   Smokeless tobacco: Never  Vaping Use   Vaping Use: Never used  Substance Use Topics   Alcohol use: No    Alcohol/week: 0.0 standard drinks   Drug use: No     Colonoscopy:  PAP:  Bone density:  Lipid panel:  Allergies  Allergen Reactions   Augmentin [Amoxicillin-Pot Clavulanate] Nausea Only    Patient reports that her reflux was bad when she took medication   Doxycycline Nausea Only and Nausea And Vomiting    Current Outpatient Medications  Medication Sig Dispense Refill  Biotin 1 MG CAPS Take by mouth.     busPIRone (BUSPAR) 10 MG tablet TAKE 1 TABLET(10 MG) BY MOUTH THREE TIMES DAILY 90 tablet 4   calcium gluconate 500 MG tablet Take 1 tablet by mouth daily.      cetirizine (ZYRTEC) 10 MG tablet Take 1 tablet (10 mg total) by mouth daily. 90 tablet 1   Cholecalciferol 1000 UNITS capsule Take 1,000 Units by mouth daily.     cyclobenzaprine (FLEXERIL) 10 MG tablet  Take 1 tablet (10 mg total) by mouth at bedtime as needed for muscle spasms. 30 tablet 1   fluticasone (FLONASE) 50 MCG/ACT nasal spray SHAKE LIQUID AND USE 2 SPRAYS IN EACH NOSTRIL DAILY 16 g 2   gabapentin (NEURONTIN) 100 MG capsule Take 1 capsule (100 mg total) by mouth 3 (three) times daily. 90 capsule 1   lisinopril (ZESTRIL) 40 MG tablet TAKE 1 TABLET(40 MG) BY MOUTH DAILY 90 tablet 3   Lysine 500 MG TABS Take by mouth as needed.      montelukast (SINGULAIR) 10 MG tablet TAKE 1 TABLET BY MOUTH AT BEDTIME 90 tablet 0   omeprazole (PRILOSEC) 40 MG capsule Take 1 capsule (40 mg total) by mouth in the morning and at bedtime. 60 capsule 0   polyethylene glycol powder (GLYCOLAX/MIRALAX) 17 GM/SCOOP powder Take 17 g by mouth daily. 507 g 1   sucralfate (CARAFATE) 1 g tablet TAKE 1 TABLET BY MOUTH FOUR TIMES DAILY BEFORE A MEAL AND AT BEDTIME 120 tablet 3   traZODone (DESYREL) 150 MG tablet TAKE 1 TABLET(150 MG) BY MOUTH AT BEDTIME 30 tablet 4   hydrochlorothiazide (HYDRODIURIL) 25 MG tablet Take 1 tablet (25 mg total) by mouth daily. (Patient not taking: Reported on 01/17/2021) 30 tablet 12   MULTIPLE VITAMINS PO Take by mouth daily.  (Patient not taking: Reported on 01/17/2021)     Omega-3 Fatty Acids (FISH OIL) 1000 MG CAPS Take by mouth daily.  (Patient not taking: Reported on 01/17/2021)     Probiotic Product (PROBIOTIC ADVANCED PO) Take by mouth. (Patient not taking: Reported on 01/17/2021)     psyllium (REGULOID) 0.52 g capsule Take 0.52 g by mouth daily. (Patient not taking: Reported on 01/17/2021)     vitamin E 100 UNIT capsule Take 100 Units by mouth daily.  (Patient not taking: Reported on 01/17/2021)     No current facility-administered medications for this visit.    OBJECTIVE: Vitals:   01/17/21 1352  BP: 119/66  Pulse: 60  Resp: 16  Temp: 98.4 F (36.9 C)     Body mass index is 22.45 kg/m.    ECOG FS:0 - Asymptomatic  General: Well-developed, well-nourished, no acute distress. Eyes:  Pink conjunctiva, anicteric sclera. HEENT: Normocephalic, moist mucous membranes. Lungs: No audible wheezing or coughing. Heart: Regular rate and rhythm. Abdomen: Soft, nontender, no obvious distention. Musculoskeletal: No edema, cyanosis, or clubbing. Neuro: Alert, answering all questions appropriately. Cranial nerves grossly intact. Skin: No rashes or petechiae noted. Psych: Normal affect. Lymphatics: No cervical, calvicular, axillary or inguinal LAD.   LAB RESULTS:  Lab Results  Component Value Date   NA 141 12/22/2020   K 4.2 12/22/2020   CL 102 12/22/2020   CO2 26 12/22/2020   GLUCOSE 83 12/22/2020   BUN 14 12/22/2020   CREATININE 0.81 12/22/2020   CALCIUM 9.8 12/22/2020   PROT 6.6 12/22/2020   ALBUMIN 4.6 12/22/2020   AST 11 12/22/2020   ALT 10 12/22/2020   ALKPHOS 72 12/22/2020  BILITOT 0.2 12/22/2020   GFRNONAA 80 01/28/2020   GFRAA 92 01/28/2020    Lab Results  Component Value Date   WBC 8.2 01/17/2021   NEUTROABS 4.3 12/22/2020   HGB 10.8 (L) 01/17/2021   HCT 35.7 (L) 01/17/2021   MCV 91.8 01/17/2021   PLT 261 01/17/2021     STUDIES: No results found.  ASSESSMENT: Anemia, unspecified.  PLAN:    Anemia, unspecified: Repeat CBC revealed a hemoglobin of 10.8.  LDH is normal at 152, therefore there likely is not underlying hemolysis.  Repeat iron stores as well as all other laboratory work is pending at time of dictation.  Because of patient's history of constipation, oral iron supplementation was not recommended and patient will receive 3 doses of 200 mg IV Venofer over the next 2 weeks.  Return to clinic in 3 months with repeat laboratory work, further evaluation, and consideration of additional IV Venofer if needed.    I spent a total of 45 minutes reviewing chart data, face-to-face evaluation with the patient, counseling and coordination of care as detailed above.   Patient expressed understanding and was in agreement with this plan. She also  understands that She can call clinic at any time with any questions, concerns, or complaints.    Lloyd Huger, MD   01/17/2021 4:07 PM

## 2021-01-17 ENCOUNTER — Encounter: Payer: Self-pay | Admitting: Oncology

## 2021-01-17 ENCOUNTER — Inpatient Hospital Stay: Payer: Medicare HMO | Attending: Oncology | Admitting: Oncology

## 2021-01-17 ENCOUNTER — Inpatient Hospital Stay: Payer: Medicare HMO

## 2021-01-17 VITALS — BP 119/66 | HR 60 | Temp 98.4°F | Resp 16 | Wt 118.8 lb

## 2021-01-17 DIAGNOSIS — D509 Iron deficiency anemia, unspecified: Secondary | ICD-10-CM | POA: Insufficient documentation

## 2021-01-17 DIAGNOSIS — D649 Anemia, unspecified: Secondary | ICD-10-CM | POA: Diagnosis not present

## 2021-01-17 HISTORY — DX: Iron deficiency anemia, unspecified: D50.9

## 2021-01-17 LAB — IRON AND TIBC
Iron: 60 ug/dL (ref 28–170)
Saturation Ratios: 17 % (ref 10.4–31.8)
TIBC: 361 ug/dL (ref 250–450)
UIBC: 301 ug/dL

## 2021-01-17 LAB — CBC
HCT: 35.7 % — ABNORMAL LOW (ref 36.0–46.0)
Hemoglobin: 10.8 g/dL — ABNORMAL LOW (ref 12.0–15.0)
MCH: 27.8 pg (ref 26.0–34.0)
MCHC: 30.3 g/dL (ref 30.0–36.0)
MCV: 91.8 fL (ref 80.0–100.0)
Platelets: 261 10*3/uL (ref 150–400)
RBC: 3.89 MIL/uL (ref 3.87–5.11)
RDW: 14.1 % (ref 11.5–15.5)
WBC: 8.2 10*3/uL (ref 4.0–10.5)
nRBC: 0 % (ref 0.0–0.2)

## 2021-01-17 LAB — VITAMIN B12: Vitamin B-12: 802 pg/mL (ref 180–914)

## 2021-01-17 LAB — FOLATE: Folate: >100 ng/mL (ref >5.9)

## 2021-01-17 LAB — FERRITIN: Ferritin: 10 ng/mL — ABNORMAL LOW (ref 11–307)

## 2021-01-17 LAB — LACTATE DEHYDROGENASE: LDH: 152 U/L (ref 98–192)

## 2021-01-17 LAB — DAT, POLYSPECIFIC AHG (ARMC ONLY): Polyspecific AHG test: NEGATIVE

## 2021-01-17 LAB — RETICULOCYTES
Immature Retic Fract: 10.6 % (ref 2.3–15.9)
RBC.: 3.8 MIL/uL — ABNORMAL LOW (ref 3.87–5.11)
Retic Count, Absolute: 55.1 10*3/uL (ref 19.0–186.0)
Retic Ct Pct: 1.5 % (ref 0.4–3.1)

## 2021-01-17 NOTE — Progress Notes (Signed)
New patient evaluation.   

## 2021-01-18 LAB — HAPTOGLOBIN: Haptoglobin: 164 mg/dL (ref 37–355)

## 2021-01-18 LAB — ERYTHROPOIETIN: Erythropoietin: 17.4 m[IU]/mL (ref 2.6–18.5)

## 2021-01-19 ENCOUNTER — Inpatient Hospital Stay: Payer: Medicare HMO

## 2021-01-19 VITALS — BP 120/55 | HR 65 | Resp 18

## 2021-01-19 DIAGNOSIS — D509 Iron deficiency anemia, unspecified: Secondary | ICD-10-CM

## 2021-01-19 DIAGNOSIS — D649 Anemia, unspecified: Secondary | ICD-10-CM | POA: Diagnosis not present

## 2021-01-19 LAB — PROTEIN ELECTROPHORESIS, SERUM
A/G Ratio: 1.6 (ref 0.7–1.7)
Albumin ELP: 3.9 g/dL (ref 2.9–4.4)
Alpha-1-Globulin: 0.1 g/dL (ref 0.0–0.4)
Alpha-2-Globulin: 0.6 g/dL (ref 0.4–1.0)
Beta Globulin: 0.9 g/dL (ref 0.7–1.3)
Gamma Globulin: 0.7 g/dL (ref 0.4–1.8)
Globulin, Total: 2.4 g/dL (ref 2.2–3.9)
Total Protein ELP: 6.3 g/dL (ref 6.0–8.5)

## 2021-01-19 MED ORDER — SODIUM CHLORIDE 0.9 % IV SOLN
Freq: Once | INTRAVENOUS | Status: AC
  Filled 2021-01-19: qty 250

## 2021-01-19 MED ORDER — IRON SUCROSE 20 MG/ML IV SOLN
200.0000 mg | Freq: Once | INTRAVENOUS | Status: AC
  Administered 2021-01-19: 200 mg via INTRAVENOUS
  Filled 2021-01-19: qty 10

## 2021-01-19 MED ORDER — SODIUM CHLORIDE 0.9 % IV SOLN
200.0000 mg | Freq: Once | INTRAVENOUS | Status: DC
  Filled 2021-01-19: qty 10

## 2021-01-19 NOTE — Patient Instructions (Signed)
CANCER CENTER [place] [facility]COLOG[facility]  Discharge Instructions: Thank you for choosing [facility] to provide your oncology and hematology care.  If you have a lab appointment with the Cancer Center, please go directly to the Cancer Center and check in at the registration area.  Wear comfortable clothing and clothing appropriate for easy access to any Portacath or PICC line.   We strive to give you quality time with your provider. You may need to reschedule your appointment if you arrive late (15 or more minutes).  Arriving late affects you and other patients whose appointments are after yours.  Also, if you miss three or more appointments without notifying the office, you may be dismissed from the clinic at the provider's discretion.      For prescription refill requests, have your pharmacy contact our office and allow 72 hours for refills to be completed.    Today you received the following chemotherapy and/or immunotherapy agents venofer       To help prevent nausea and vomiting after your treatment, we encourage you to take your nausea medication as directed.  BELOW ARE SYMPTOMS THAT SHOULD BE REPORTED IMMEDIATELY: *FEVER GREATER THAN 100.4 F (38 C) OR HIGHER *CHILLS OR SWEATING *NAUSEA AND VOMITING THAT IS NOT CONTROLLED WITH YOUR NAUSEA MEDICATION *UNUSUAL SHORTNESS OF BREATH *UNUSUAL BRUISING OR BLEEDING *URINARY PROBLEMS (pain or burning when urinating, or frequent urination) *BOWEL PROBLEMS (unusual diarrhea, constipation, pain near the anus) TENDERNESS IN MOUTH AND THROAT WITH OR WITHOUT PRESENCE OF ULCERS (sore throat, sores in mouth, or a toothache) UNUSUAL RASH, SWELLING OR PAIN  UNUSUAL VAGINAL DISCHARGE OR ITCHING   Items with * indicate a potential emergency and should be followed up as soon as possible or go to the Emergency Department if any problems should occur.  Please show the CHEMOTHERAPY ALERT CARD or IMMUNOTHERAPY ALERT CARD at check-in  to the Emergency Department and triage nurse.  Should you have questions after your visit or need to cancel or reschedule your appointment, please contact [facility]CER CENTER [place] [facility]  [phone] and follow the prompts.  Office hours are 8:00 a.m. to 4:30 p.m. Monday - Friday. Please note that voicemails left after 4:00 p.m. may not be returned until the following business day.  We are closed weekends and major holidays. You have access to a nurse at all times for urgent questions. Please call the main number to the clinic [phone] and follow the prompts.  For any non-urgent questions, you may also contact your provider using MyChart. We now offer e-Visits for anyone 18 and older to request care online for non-urgent symptoms. For details visit [email or web address].   Also download the MyChart app! Go to the app store, search "MyChart", open the app, select [place], and log in with your MyChart username and password.  Due to Covid, a mask is required upon entering the hospital/clinic. If you do not have a mask, one will be given to you upon arrival. For doctor visits, patients may have 1 support person aged 18 or older with them. For treatment visits, patients cannot have anyone with them due to current Covid guidelines and our immunocompromised population.   Iron Sucrose injection What is this medication? IRON SUCROSE (AHY ern SOO krohs) is an iron complex. Iron is used to make healthy red blood cells, which carry oxygen and nutrients throughout the body. This medicine is used to treat iron deficiency anemia in people with chronickidney disease. This medicine may be used for   other purposes; ask your health care provider orpharmacist if you have questions. COMMON BRAND NAME(S): Venofer What should I tell my care team before I take this medication? They need to know if you have any of these conditions: anemia not caused by low iron levels heart disease high levels of  iron in the blood kidney disease liver disease an unusual or allergic reaction to iron, other medicines, foods, dyes, or preservatives pregnant or trying to get pregnant breast-feeding How should I use this medication? This medicine is for infusion into a vein. It is given by a health careprofessional in a hospital or clinic setting. Talk to your pediatrician regarding the use of this medicine in children. While this drug may be prescribed for children as young as 2 years for selectedconditions, precautions do apply. Overdosage: If you think you have taken too much of this medicine contact apoison control center or emergency room at once. NOTE: This medicine is only for you. Do not share this medicine with others. What if I miss a dose? It is important not to miss your dose. Call your doctor or health careprofessional if you are unable to keep an appointment. What may interact with this medication? Do not take this medicine with any of the following medications: deferoxamine dimercaprol other iron products This medicine may also interact with the following medications: chloramphenicol deferasirox This list may not describe all possible interactions. Give your health care provider a list of all the medicines, herbs, non-prescription drugs, or dietary supplements you use. Also tell them if you smoke, drink alcohol, or use illegaldrugs. Some items may interact with your medicine. What should I watch for while using this medication? Visit your doctor or healthcare professional regularly. Tell your doctor or healthcare professional if your symptoms do not start to get better or if theyget worse. You may need blood work done while you are taking this medicine. You may need to follow a special diet. Talk to your doctor. Foods that contain iron include: whole grains/cereals, dried fruits, beans, or peas, leafy greenvegetables, and organ meats (liver, kidney). What side effects may I notice from  receiving this medication? Side effects that you should report to your doctor or health care professionalas soon as possible: allergic reactions like skin rash, itching or hives, swelling of the face, lips, or tongue breathing problems changes in blood pressure cough fast, irregular heartbeat feeling faint or lightheaded, falls fever or chills flushing, sweating, or hot feelings joint or muscle aches/pains seizures swelling of the ankles or feet unusually weak or tired Side effects that usually do not require medical attention (report to yourdoctor or health care professional if they continue or are bothersome): diarrhea feeling achy headache irritation at site where injected nausea, vomiting stomach upset tiredness This list may not describe all possible side effects. Call your doctor for medical advice about side effects. You may report side effects to FDA at1-800-FDA-1088. Where should I keep my medication? This drug is given in a hospital or clinic and will not be stored at home. NOTE: This sheet is a summary. It may not cover all possible information. If you have questions about this medicine, talk to your doctor, pharmacist, orhealth care provider.  2022 Elsevier/Gold Standard (2011-03-15 17:14:35)  

## 2021-01-23 ENCOUNTER — Inpatient Hospital Stay: Payer: Medicare HMO

## 2021-01-23 ENCOUNTER — Other Ambulatory Visit: Payer: Self-pay

## 2021-01-23 VITALS — BP 123/73 | HR 68

## 2021-01-23 DIAGNOSIS — D509 Iron deficiency anemia, unspecified: Secondary | ICD-10-CM

## 2021-01-23 DIAGNOSIS — D649 Anemia, unspecified: Secondary | ICD-10-CM | POA: Diagnosis not present

## 2021-01-23 MED ORDER — SODIUM CHLORIDE 0.9 % IV SOLN
Freq: Once | INTRAVENOUS | Status: AC
  Filled 2021-01-23: qty 250

## 2021-01-23 MED ORDER — IRON SUCROSE 20 MG/ML IV SOLN
200.0000 mg | Freq: Once | INTRAVENOUS | Status: AC
  Administered 2021-01-23: 200 mg via INTRAVENOUS
  Filled 2021-01-23: qty 10

## 2021-01-23 MED ORDER — SODIUM CHLORIDE 0.9 % IV SOLN: 200.0000 mg | Freq: Once | INTRAVENOUS | Status: DC

## 2021-01-23 NOTE — Patient Instructions (Signed)
CANCER CENTER [place] [facility]COLOG[facility]  Discharge Instructions: Thank you for choosing [facility] to provide your oncology and hematology care.  If you have a lab appointment with the Cancer Center, please go directly to the Cancer Center and check in at the registration area.  Wear comfortable clothing and clothing appropriate for easy access to any Portacath or PICC line.   We strive to give you quality time with your provider. You may need to reschedule your appointment if you arrive late (15 or more minutes).  Arriving late affects you and other patients whose appointments are after yours.  Also, if you miss three or more appointments without notifying the office, you may be dismissed from the clinic at the provider's discretion.      For prescription refill requests, have your pharmacy contact our office and allow 72 hours for refills to be completed.    Today you received the following : Venofer   To help prevent nausea and vomiting after your treatment, we encourage you to take your nausea medication as directed.  BELOW ARE SYMPTOMS THAT SHOULD BE REPORTED IMMEDIATELY: . *FEVER GREATER THAN 100.4 F (38 C) OR HIGHER . *CHILLS OR SWEATING . *NAUSEA AND VOMITING THAT IS NOT CONTROLLED WITH YOUR NAUSEA MEDICATION . *UNUSUAL SHORTNESS OF BREATH . *UNUSUAL BRUISING OR BLEEDING . *URINARY PROBLEMS (pain or burning when urinating, or frequent urination) . *BOWEL PROBLEMS (unusual diarrhea, constipation, pain near the anus) . TENDERNESS IN MOUTH AND THROAT WITH OR WITHOUT PRESENCE OF ULCERS (sore throat, sores in mouth, or a toothache) . UNUSUAL RASH, SWELLING OR PAIN  . UNUSUAL VAGINAL DISCHARGE OR ITCHING   Items with * indicate a potential emergency and should be followed up as soon as possible or go to the Emergency Department if any problems should occur.  Please show the CHEMOTHERAPY ALERT CARD or IMMUNOTHERAPY ALERT CARD at check-in to the Emergency  Department and triage nurse.  Should you have questions after your visit or need to cancel or reschedule your appointment, please contact [facility] CENTER [place] [facility]  [phone] and follow the prompts.  Office hours are 8:00 a.m. to 4:30 p.m. Monday - Friday. Please note that voicemails left after 4:00 p.m. may not be returned until the following business day.  We are closed weekends and major holidays. You have access to a nurse at all times for urgent questions. Please call the main number to the clinic [phone] and follow the prompts.  For any non-urgent questions, you may also contact your provider using MyChart. We now offer e-Visits for anyone 18 and older to request care online for non-urgent symptoms. For details visit [email or web address].   Also download the MyChart app! Go to the app store, search "MyChart", open the app, select [place], and log in with your MyChart username and password.  Due to Covid, a mask is required upon entering the hospital/clinic. If you do not have a mask, one will be given to you upon arrival. For doctor visits, patients may have 1 support person aged 18 or older with them. For treatment visits, patients cannot have anyone with them due to current Covid guidelines and our immunocompromised population.  

## 2021-01-26 ENCOUNTER — Inpatient Hospital Stay: Payer: Medicare HMO

## 2021-01-26 VITALS — BP 120/73 | HR 58 | Temp 97.0°F | Resp 19

## 2021-01-26 DIAGNOSIS — D509 Iron deficiency anemia, unspecified: Secondary | ICD-10-CM

## 2021-01-26 DIAGNOSIS — D649 Anemia, unspecified: Secondary | ICD-10-CM | POA: Diagnosis not present

## 2021-01-26 MED ORDER — SODIUM CHLORIDE 0.9 % IV SOLN: 200.0000 mg | Freq: Once | INTRAVENOUS | Status: DC

## 2021-01-26 MED ORDER — IRON SUCROSE 20 MG/ML IV SOLN
200.0000 mg | Freq: Once | INTRAVENOUS | Status: AC
  Administered 2021-01-26: 200 mg via INTRAVENOUS
  Filled 2021-01-26: qty 10

## 2021-01-26 MED ORDER — SODIUM CHLORIDE 0.9 % IV SOLN
Freq: Once | INTRAVENOUS | Status: AC
  Filled 2021-01-26: qty 250

## 2021-01-26 NOTE — Patient Instructions (Signed)
CANCER CENTER [place] [facility] MEDICAL ONCOLOG[facility]  Discharge Instructions: Thank you for choosing [facility] to provide your oncology and hematology care.  If you have a lab appointment with the Cancer Center, please go directly to the Cancer Center and check in at the registration area.  Wear comfortable clothing and clothing appropriate for easy access to any Portacath or PICC line.   We strive to give you quality time with your provider. You may need to reschedule your appointment if you arrive late (15 or more minutes).  Arriving late affects you and other patients whose appointments are after yours.  Also, if you miss three or more appointments without notifying the office, you may be dismissed from the clinic at the provider's discretion.      For prescription refill requests, have your pharmacy contact our office and allow 72 hours for refills to be completed.    Today you received venofer   To help prevent nausea and vomiting after your treatment, we encourage you to take your nausea medication as directed.  BELOW ARE SYMPTOMS THAT SHOULD BE REPORTED IMMEDIATELY: *FEVER GREATER THAN 100.4 F (38 C) OR HIGHER *CHILLS OR SWEATING *NAUSEA AND VOMITING THAT IS NOT CONTROLLED WITH YOUR NAUSEA MEDICATION *UNUSUAL SHORTNESS OF BREATH *UNUSUAL BRUISING OR BLEEDING *URINARY PROBLEMS (pain or burning when urinating, or frequent urination) *BOWEL PROBLEMS (unusual diarrhea, constipation, pain near the anus) TENDERNESS IN MOUTH AND THROAT WITH OR WITHOUT PRESENCE OF ULCERS (sore throat, sores in mouth, or a toothache) UNUSUAL RASH, SWELLING OR PAIN  UNUSUAL VAGINAL DISCHARGE OR ITCHING   Items with * indicate a potential emergency and should be followed up as soon as possible or go to the Emergency Department if any problems should occur.  Please show the CHEMOTHERAPY ALERT CARD or IMMUNOTHERAPY ALERT CARD at check-in to the Emergency Department and triage nurse.  Should you  have questions after your visit or need to cancel or reschedule your appointment, please contact CANCER CENTER [place] [facility]  [phone] and follow the prompts.  Office hours are 8:00 a.m. to 4:30 p.m. Monday - Friday. Please note that voicemails left after 4:00 p.m. may not be returned until the following business day.  We are closed weekends and major holidays. You have access to a nurse at all times for urgent questions. Please call the main number to the clinic [phone] and follow the prompts.  For any non-urgent questions, you may also contact your provider using MyChart. We now offer e-Visits for anyone 18 and older to request care online for non-urgent symptoms. For details visit [email or web address].   Also download the MyChart app! Go to the app store, search "MyChart", open the app, select [place], and log in with your MyChart username and password.  Due to Covid, a mask is required upon entering the hospital/clinic. If you do not have a mask, one will be given to you upon arrival. For doctor visits, patients may have 1 support person aged 18 or older with them. For treatment visits, patients cannot have anyone with them due to current Covid guidelines and our immunocompromised population.  

## 2021-01-27 ENCOUNTER — Other Ambulatory Visit: Payer: Self-pay | Admitting: Family Medicine

## 2021-01-27 NOTE — Telephone Encounter (Signed)
  Notes to clinic:  [facility] filled on 08/09/2020 Review for continued use and refill    Requested Prescriptions  Pending Prescriptions Disp Refills   gabapentin (NEURONTIN) 100 MG capsule [Pharmacy Med Name: GABAPENTIN '100MG'$  CAPSULES] 90 capsule 1    Sig: TAKE 1 CAPSULE(100 MG) BY MOUTH THREE TIMES DAILY     Neurology: Anticonvulsants - gabapentin Passed - 01/27/2021  1:28 PM      Passed - Valid encounter within last 12 months    Recent Outpatient Visits           1 month ago Anemia, unspecified type   [facility] Jerrol Banana., [facility]   2 months ago Seasonal allergic rhinitis due to other allergic trigger   [place], Vickki Muff, PA-C   5 months ago Primary hypertension   [facility] Jerrol Banana., MD   6 months ago Herpes zoster without complication   [facility] Jerrol Banana., MD   7 months ago Herpes zoster without complication   [facility], Dionne Bucy, MD       Future Appointments             In 3 weeks Jerrol Banana., MD [facility], [place]   In 2 months Virgel Manifold, MD [place]

## 2021-02-14 ENCOUNTER — Other Ambulatory Visit: Payer: Self-pay

## 2021-02-14 DIAGNOSIS — D509 Iron deficiency anemia, unspecified: Secondary | ICD-10-CM

## 2021-02-22 ENCOUNTER — Telehealth: Payer: Self-pay | Admitting: Gastroenterology

## 2021-02-22 NOTE — Telephone Encounter (Signed)
Pt. Called to get a copy of her instructions sent to mychart.

## 2021-02-22 NOTE — Telephone Encounter (Signed)
Pt is aware and has received instructions via email

## 2021-02-23 ENCOUNTER — Ambulatory Visit (INDEPENDENT_AMBULATORY_CARE_PROVIDER_SITE_OTHER): Payer: Medicare HMO | Admitting: Family Medicine

## 2021-02-23 ENCOUNTER — Other Ambulatory Visit: Payer: Self-pay

## 2021-02-23 ENCOUNTER — Encounter: Payer: Self-pay | Admitting: Family Medicine

## 2021-02-23 VITALS — BP 115/60 | HR 64 | Temp 98.4°F | Resp 16 | Ht 61.0 in | Wt 121.0 lb

## 2021-02-23 DIAGNOSIS — I10 Essential (primary) hypertension: Secondary | ICD-10-CM

## 2021-02-23 DIAGNOSIS — K219 Gastro-esophageal reflux disease without esophagitis: Secondary | ICD-10-CM

## 2021-02-23 DIAGNOSIS — Z23 Encounter for immunization: Secondary | ICD-10-CM | POA: Diagnosis not present

## 2021-02-23 MED ORDER — SUCRALFATE 1 G PO TABS: 1.0000 g | ORAL_TABLET | Freq: Three times a day (TID) | ORAL | 11 refills | Status: DC

## 2021-02-23 MED ORDER — OMEPRAZOLE 40 MG PO CPDR: 40.0000 mg | DELAYED_RELEASE_CAPSULE | Freq: Two times a day (BID) | ORAL | 5 refills | Status: DC

## 2021-02-23 NOTE — Progress Notes (Signed)
Established patient visit   Patient: Jocelyn Harris   DOB: Jan 17, 1951   70 y.o. Female  MRN: [id number] Visit Date: 02/23/2021  Today's healthcare provider: Wilhemena Durie, MD   Chief Complaint  Patient presents with   Hypertension   Subjective    HPI  Patient comes in today for follow-up.  She is undergoing a GI work-up wants to discuss her upper GI symptoms. Hypertension, follow-up  BP Readings from Last 3 Encounters:  02/23/21 115/60  01/26/21 120/73  01/23/21 123/73   Wt Readings from Last 3 Encounters:  02/23/21 121 lb (54.9 kg)  01/17/21 118 lb 12.8 oz (53.9 kg)  01/05/21 121 lb 6.4 oz (55.1 kg)     She was last seen for hypertension 2 months ago.  BP at that visit was 12/22/2020. Management since that visit includes; Stopped HCTZ and follow-up in a couple of months. She reports good compliance with treatment. She is not having side effects.  She is exercising. She is adherent to low salt diet.   Outside blood pressures are not being checked.  She does not smoke.  Use of agents associated with hypertension: none.       Medications: Outpatient Medications Prior to Visit  Medication Sig   Biotin 1 MG CAPS Take by mouth.   busPIRone (BUSPAR) 10 MG tablet TAKE 1 TABLET(10 MG) BY MOUTH THREE TIMES DAILY   calcium gluconate 500 MG tablet Take 1 tablet by mouth daily.    cetirizine (ZYRTEC) 10 MG tablet Take 1 tablet (10 mg total) by mouth daily.   Cholecalciferol 1000 UNITS capsule Take 1,000 Units by mouth daily.   cyclobenzaprine (FLEXERIL) 10 MG tablet Take 1 tablet (10 mg total) by mouth at bedtime as needed for muscle spasms.   fluticasone (FLONASE) 50 MCG/ACT nasal spray SHAKE LIQUID AND USE 2 SPRAYS IN EACH NOSTRIL DAILY   gabapentin (NEURONTIN) 100 MG capsule TAKE 1 CAPSULE(100 MG) BY MOUTH THREE TIMES DAILY   lisinopril (ZESTRIL) 40 MG tablet TAKE 1 TABLET(40 MG) BY MOUTH DAILY   Lysine 500 MG TABS Take by mouth as needed.    montelukast  (SINGULAIR) 10 MG tablet TAKE 1 TABLET BY MOUTH AT BEDTIME   omeprazole (PRILOSEC) 40 MG capsule Take 1 capsule (40 mg total) by mouth in the morning and at bedtime.   polyethylene glycol powder (GLYCOLAX/MIRALAX) 17 GM/SCOOP powder Take 17 g by mouth daily.   sucralfate (CARAFATE) 1 g tablet TAKE 1 TABLET BY MOUTH FOUR TIMES DAILY BEFORE A MEAL AND AT BEDTIME   traZODone (DESYREL) 150 MG tablet TAKE 1 TABLET(150 MG) BY MOUTH AT BEDTIME   hydrochlorothiazide (HYDRODIURIL) 25 MG tablet Take 1 tablet (25 mg total) by mouth daily. (Patient not taking: No sig reported)   MULTIPLE VITAMINS PO Take by mouth daily.  (Patient not taking: No sig reported)   Omega-3 Fatty Acids (FISH OIL) 1000 MG CAPS Take by mouth daily.  (Patient not taking: No sig reported)   Probiotic Product (PROBIOTIC ADVANCED PO) Take by mouth. (Patient not taking: No sig reported)   psyllium (REGULOID) 0.52 g capsule Take 0.52 g by mouth daily. (Patient not taking: No sig reported)   vitamin E 100 UNIT capsule Take 100 Units by mouth daily.  (Patient not taking: No sig reported)   No facility-administered medications prior to visit.    Review of Systems  Constitutional:  Negative for appetite change, chills, fatigue and fever.  Respiratory:  Negative for chest tightness and  shortness of breath.   Cardiovascular:  Negative for chest pain and palpitations.  Gastrointestinal:  Negative for abdominal pain, nausea and vomiting.  Neurological:  Negative for dizziness and weakness.       Objective    BP 115/60   Pulse 64   Temp 98.4 F (36.9 C)   Resp 16   Ht '5\' 1"'$  (1.549 m)   Wt 121 lb (54.9 kg)   BMI 22.86 kg/m  BP Readings from Last 3 Encounters:  02/23/21 115/60  01/26/21 120/73  01/23/21 123/73   Wt Readings from Last 3 Encounters:  02/23/21 121 lb (54.9 kg)  01/17/21 118 lb 12.8 oz (53.9 kg)  01/05/21 121 lb 6.4 oz (55.1 kg)      Physical Exam Vitals reviewed.  Constitutional:      Appearance: She is  well-developed.  HENT:     Head: Normocephalic and atraumatic.     Right Ear: External ear normal.     Left Ear: External ear normal.     Nose: Nose normal.  Eyes:     Conjunctiva/sclera: Conjunctivae normal.     Pupils: Pupils are equal, round, and reactive to light.  Cardiovascular:     Rate and Rhythm: Normal rate and regular rhythm.     Heart sounds: Normal heart sounds.  Pulmonary:     Effort: Pulmonary effort is normal.     Breath sounds: Normal breath sounds.  Abdominal:     General: Bowel sounds are normal.     Palpations: Abdomen is soft.     Tenderness: There is no abdominal tenderness.  Genitourinary:    Rectum: Internal hemorrhoid present.  Musculoskeletal:     Cervical back: Normal range of motion and neck supple.     Right lower leg: No edema.     Left lower leg: No edema.  Skin:    General: Skin is warm and dry.  Neurological:     General: No focal deficit present.     Mental Status: She is alert and oriented to person, place, and time.  Psychiatric:        Mood and Affect: Mood normal.        Behavior: Behavior normal.        Thought Content: Thought content normal.        Judgment: Judgment normal.      No results found for any visits on 02/23/21.  Assessment & Plan     1. Primary hypertension Good control.  2. Need for influenza vaccination  - Flu Vaccine QUAD High Dose(Fluad)  3. Gastroesophageal reflux disease Patient has GI work-up ongoing.  At this time use omeprazole twice a day and Carafate for her symptoms - omeprazole (PRILOSEC) 40 MG capsule; Take 1 capsule (40 mg total) by mouth in the morning and at bedtime.  Dispense: 60 capsule; Refill: 5 - sucralfate (CARAFATE) 1 g tablet; Take 1 tablet (1 g total) by mouth 4 (four) times daily -  with meals and at bedtime.  Dispense: 120 tablet; Refill: 11   No follow-ups on file.      I, Wilhemena Durie, MD, have reviewed all documentation for this visit. The documentation on 02/26/21 for  the exam, diagnosis, procedures, and orders are all accurate and complete.    Williamson Cavanah Cranford Mon, MD  [facility] [phone] (phone) [phone] (fax)  [place]

## 2021-02-27 ENCOUNTER — Telehealth: Payer: Self-pay | Admitting: Gastroenterology

## 2021-02-27 NOTE — Telephone Encounter (Signed)
Patient wants a returned call to discuss Capsule study. Clinical staff will follow up with patient.

## 2021-02-27 NOTE — Telephone Encounter (Signed)
Capsule study moved to 9/20 from 9/15... I went over instructions for patient and she expressed understanding

## 2021-02-27 NOTE — Telephone Encounter (Signed)
Patient wants to reschedule procedure.

## 2021-03-06 ENCOUNTER — Telehealth: Payer: Self-pay | Admitting: Gastroenterology

## 2021-03-06 NOTE — Telephone Encounter (Signed)
I spoke to pt and went over instructions and that she will need to do Miralax with 2 quarts of gatorade instead due to the recall of magnesium citrate

## 2021-03-06 NOTE — Telephone Encounter (Signed)
Pt. Calling again. Requesting a call back about upcoming procedure

## 2021-03-06 NOTE — Telephone Encounter (Signed)
Pt calling she has a question about her prep for her procedure tomorrow

## 2021-03-07 ENCOUNTER — Encounter
Admission: RE | Disposition: A | Discharge status: Home / Self Care | Payer: Self-pay | Source: Home / Self Care | Attending: Gastroenterology

## 2021-03-07 ENCOUNTER — Ambulatory Visit
Admission: RE | Admit: 2021-03-07 | Discharge: 2021-03-07 | Disposition: A | Discharge status: Home / Self Care | Payer: Medicare HMO | Attending: Gastroenterology | Admitting: Gastroenterology

## 2021-03-07 DIAGNOSIS — D509 Iron deficiency anemia, unspecified: Secondary | ICD-10-CM | POA: Insufficient documentation

## 2021-03-07 HISTORY — PX: GIVENS CAPSULE STUDY: SHX5432

## 2021-03-07 SURGERY — IMAGING PROCEDURE, GI TRACT, INTRALUMINAL, VIA CAPSULE

## 2021-03-08 ENCOUNTER — Encounter: Payer: Self-pay | Admitting: Gastroenterology

## 2021-03-12 ENCOUNTER — Other Ambulatory Visit: Payer: Self-pay | Admitting: Family Medicine

## 2021-03-12 DIAGNOSIS — K219 Gastro-esophageal reflux disease without esophagitis: Secondary | ICD-10-CM

## 2021-03-12 NOTE — Telephone Encounter (Signed)
Discontinued on 01/05/21-Change in therapy. This dosage no longer used-refused.

## 2021-03-27 ENCOUNTER — Other Ambulatory Visit: Payer: Self-pay | Admitting: Family Medicine

## 2021-03-27 DIAGNOSIS — J309 Allergic rhinitis, unspecified: Secondary | ICD-10-CM

## 2021-03-27 NOTE — Telephone Encounter (Signed)
Requested Prescriptions  Pending Prescriptions Disp Refills  . montelukast (SINGULAIR) 10 MG tablet [Pharmacy Med Name: MONTELUKAST 10MG  TABLETS] 90 tablet 1    Sig: TAKE 1 TABLET BY MOUTH EVERY NIGHT AT BEDTIME     Pulmonology:  Leukotriene Inhibitors Passed - 03/27/2021  3:42 AM      Passed - Valid encounter within last 12 months    Recent Outpatient Visits          1 month ago Primary hypertension   [facility] Jerrol Banana., MD   3 months ago Anemia, unspecified type   [facility] Jerrol Banana., MD   4 months ago Seasonal allergic rhinitis due to other allergic trigger   [place], Vickki Muff, [place]   7 months ago Primary hypertension   [facility] Jerrol Banana., MD   8 months ago Herpes zoster without complication   [facility] Jerrol Banana., MD      Future Appointments            In 2 weeks Virgel Manifold, MD [place]   In 5 months Jerrol Banana., MD [facility], [place]

## 2021-03-28 ENCOUNTER — Other Ambulatory Visit: Payer: Self-pay | Admitting: Family Medicine

## 2021-04-13 ENCOUNTER — Other Ambulatory Visit: Payer: Self-pay

## 2021-04-13 ENCOUNTER — Ambulatory Visit: Payer: Medicare HMO | Admitting: Gastroenterology

## 2021-04-13 DIAGNOSIS — K219 Gastro-esophageal reflux disease without esophagitis: Secondary | ICD-10-CM

## 2021-04-13 DIAGNOSIS — R1319 Other dysphagia: Secondary | ICD-10-CM

## 2021-04-13 DIAGNOSIS — K5900 Constipation, unspecified: Secondary | ICD-10-CM

## 2021-04-13 NOTE — Patient Instructions (Signed)
Start taking Omeprazole only one daily  I will place the referral to [facility] for a pH manometry. Their phone number is [phone]  Manometry is a test that measures the pressure in your stomach and records how frequently acidic fluid comes up

## 2021-04-13 NOTE — Progress Notes (Signed)
Jocelyn Antigua, MD [address]  [place]  [place], [place] [address]  Main: [phone]  Fax: [phone]   Primary Care Physician: Jerrol Banana., MD   Chief Complaint  Patient presents with   Follow-up    Pt reports 2-3 BM per week, overall doing better.... Pt reports intermittent epigastric shooting/burning pain and feeling as though her food is getting stuck when she swallows    Medication Refill    Miralax    HPI: Jocelyn Harris is a 70 y.o. female here for follow-up of reflux, dysphagia, constipation.  Is continuing to report dysphagia, to solid foods.  No episodes of food impaction.  No nausea or vomiting.  Does describe some constipation.  Is not taking MiraLAX daily.  No blood in stool.  Describes epigastric abdominal discomfort, 5/10, that may get better after bowel movement.  Please take omeprazole twice a day.  She underwent EGD and colonoscopy in June 2022.  EGD showed salmon-colored mucosa in the distal esophagus.  Gastric polyps.  Colonoscopy done for FOBT positive stool showed a solitary ulcer in the terminal ileum, diverticulosis.  Nonbleeding internal hemorrhoids.  She also underwent capsule study for evaluation of iron deficiency anemia, that did not show any concerning lesions.  The  Previous history: Last colonoscopy was in 2015 by Dr. Candace Cruise for screening which was normal and repeat was recommended in 10 years.     Patient had an EGD in 1995 for abdominal pain that showed gastritis, duodenitis, hiatal hernia.  ROS: All ROS reviewed and negative except as per HPI   Past Medical History:  Diagnosis Date   GERD (gastroesophageal reflux disease)    Hypertension    TMJ (dislocation of temporomandibular joint)     Past Surgical History:  Procedure Laterality Date   COLONOSCOPY WITH PROPOFOL N/A 12/01/2020   Procedure: COLONOSCOPY WITH PROPOFOL;  Surgeon: Virgel Manifold, MD;  Location: [facility];  Service: Endoscopy;   Laterality: N/A;   ESOPHAGOGASTRODUODENOSCOPY (EGD) WITH PROPOFOL N/A 12/01/2020   Procedure: ESOPHAGOGASTRODUODENOSCOPY (EGD) WITH PROPOFOL;  Surgeon: Virgel Manifold, MD;  Location: [facility];  Service: Endoscopy;  Laterality: N/A;   GIVENS CAPSULE STUDY N/A 03/07/2021   Procedure: GIVENS CAPSULE STUDY;  Surgeon: Virgel Manifold, MD;  Location: [facility];  Service: Endoscopy;  Laterality: N/A;   KNEE SURGERY Right    TOE SURGERY     TUBAL LIGATION      Prior to Admission medications   Medication Sig Start Date End Date Taking? Authorizing Provider  Biotin 1 MG CAPS Take by mouth.   Yes [provider]  busPIRone (BUSPAR) 10 MG tablet TAKE 1 TABLET(10 MG) BY MOUTH THREE TIMES DAILY 09/30/20  Yes Jerrol Banana., MD  calcium gluconate 500 MG tablet Take 1 tablet by mouth daily.    Yes [provider]  cetirizine (ZYRTEC) 10 MG tablet Take 1 tablet (10 mg total) by mouth daily. 11/03/20  Yes Chrismon, Vickki Muff, PA-C  Cholecalciferol 1000 UNITS capsule Take 1,000 Units by mouth daily.   Yes [provider]  cyclobenzaprine (FLEXERIL) 10 MG tablet Take 1 tablet (10 mg total) by mouth at bedtime as needed for muscle spasms. 12/22/20  Yes Jerrol Banana., MD  fluticasone [facility]) 50 MCG/ACT nasal spray SHAKE LIQUID AND USE 2 SPRAYS IN [facility] NOSTRIL DAILY 06/30/20  Yes Jerrol Banana., MD  gabapentin (NEURONTIN) 100 MG capsule TAKE 1 CAPSULE(100 MG) BY MOUTH THREE TIMES DAILY 03/28/21  Yes Jerrol Banana., MD  hydrochlorothiazide (HYDRODIURIL) 25 MG tablet Take 1 tablet (25 mg total) by mouth daily. 03/08/20  Yes Jerrol Banana., MD  lisinopril (ZESTRIL) 40 MG tablet TAKE 1 TABLET(40 MG) BY MOUTH DAILY 03/15/20  Yes Jerrol Banana., MD  Lysine 500 MG TABS Take by mouth as needed.    Yes [provider]  montelukast (SINGULAIR) 10 MG tablet TAKE 1 TABLET BY MOUTH EVERY NIGHT AT BEDTIME 03/27/21  Yes Jerrol Banana., MD  MULTIPLE VITAMINS PO Take by mouth daily.   Yes [provider]  Omega-3 Fatty Acids (FISH OIL) 1000 MG CAPS Take by mouth daily.   Yes [provider]  omeprazole (PRILOSEC) 40 MG capsule Take 1 capsule (40 mg total) by mouth in the morning and at bedtime. 02/23/21  Yes Jerrol Banana., MD  polyethylene glycol powder [facility]) 17 GM/SCOOP powder Take 17 g by mouth daily. 01/05/21  Yes Virgel Manifold, MD  Probiotic Product (PROBIOTIC ADVANCED PO) Take by mouth.   Yes [provider]  psyllium (REGULOID) 0.52 g capsule Take 0.52 g by mouth daily.   Yes [provider]  sucralfate (CARAFATE) 1 g tablet Take 1 tablet (1 g total) by mouth 4 (four) times daily -  with meals and at bedtime. 02/23/21  Yes Jerrol Banana., MD  traZODone (DESYREL) 150 MG tablet TAKE 1 TABLET(150 MG) BY MOUTH AT BEDTIME 07/13/20  Yes Jerrol Banana., MD  vitamin E 100 UNIT capsule Take 100 Units by mouth daily.   Yes [provider]    Family History  Problem Relation Age of Onset   Diabetes Mother    Hypertension Mother    Congestive Heart Failure Mother    Hypertension Father    Lung cancer Father    Fibromyalgia Sister    Lupus Sister    Cancer Sister        of blood   Leukemia Paternal Grandmother    Healthy Daughter      Social History   Tobacco Use   Smoking status: Never   Smokeless tobacco: Never  Vaping Use   Vaping Use: Never used  Substance Use Topics   Alcohol use: No    Alcohol/week: 0.0 standard drinks   Drug use: No    Allergies as of 04/13/2021 - Review Complete 04/13/2021  Allergen Reaction Noted   Augmentin [amoxicillin-pot clavulanate] Nausea Only 06/07/2016   Doxycycline Nausea Only and Nausea And Vomiting 01/27/2015    Physical Examination:  Constitutional: General:   Alert,  Well-developed, well-nourished, pleasant and cooperative in NAD There were no vitals taken for this  visit.  Respiratory: Normal respiratory effort  Gastrointestinal:  Soft, non-tender and non-distended without masses, hepatosplenomegaly or hernias noted.  No guarding or rebound tenderness.     Cardiac: No clubbing or edema.  No cyanosis. Normal posterior tibial pedal pulses noted.  Psych:  Alert and cooperative. Normal mood and affect.  Musculoskeletal:  Normal gait. Head normocephalic, atraumatic. Symmetrical without gross deformities. 5/5 Lower extremity strength bilaterally.  Skin: Warm. Intact without significant lesions or rashes. No jaundice.  Neck: Supple, trachea midline  Lymph: No cervical lymphadenopathy  Psych:  Alert and oriented x3, Alert and cooperative. Normal mood and affect.  Labs: CMP     Component Value Date/Time   NA 141 12/22/2020 1531   K 4.2 12/22/2020 1531   CL 102 12/22/2020 1531   CO2 26 12/22/2020  1531   GLUCOSE 83 12/22/2020 1531   BUN 14 12/22/2020 1531   CREATININE 0.81 12/22/2020 1531   CALCIUM 9.8 12/22/2020 1531   PROT 6.6 12/22/2020 1531   ALBUMIN 4.6 12/22/2020 1531   AST 11 12/22/2020 1531   ALT 10 12/22/2020 1531   ALKPHOS 72 12/22/2020 1531   BILITOT 0.2 12/22/2020 1531   GFRNONAA 80 01/28/2020 1001   GFRAA 92 01/28/2020 1001   Lab Results  Component Value Date   WBC 8.2 01/17/2021   HGB 10.8 (L) 01/17/2021   HCT 35.7 (L) 01/17/2021   MCV 91.8 01/17/2021   PLT 261 01/17/2021    Imaging Studies:   Assessment and Plan:   Jocelyn Harris is a 70 y.o. y/o female here for follow-up of reflux, reporting dysphagia  Would recommend manometry with pH study for reflux and dysphagia Patient agreeable Decrease have resolved to once daily at this time If symptoms worsen, can add Pepcid in the evening or at bedtime instead of twice daily PPI, to minimize adverse effects associated with PPI  (Risks of PPI use were discussed with patient including bone loss, C. Diff diarrhea, pneumonia, infections, CKD, electrolyte abnormalities.   Pt. Verbalizes understanding and chooses to continue the medication.)  Abdominal discomfort likely due to underlying constipation  High-fiber diet MiraLAX daily with goal of 1-2 soft bowel movements daily.  If not at goal, patient instructed to increase dose to twice daily.  If loose stools with the medication, patient asked to decrease the medication to every other day, or half dose daily.  Patient verbalized understanding  Patient is not taking MiraLAX regularly at this time and was encouraged to do so  Continue to follow-up with hematology for iron replacement as necessary    Dr Jocelyn Harris

## 2021-04-19 ENCOUNTER — Other Ambulatory Visit: Payer: Self-pay | Admitting: Family Medicine

## 2021-04-19 DIAGNOSIS — I10 Essential (primary) hypertension: Secondary | ICD-10-CM

## 2021-04-19 MED ORDER — LISINOPRIL 40 MG PO TABS: ORAL_TABLET | ORAL | 1 refills | Status: DC

## 2021-04-19 NOTE — Telephone Encounter (Signed)
Requested Prescriptions  Pending Prescriptions Disp Refills  . lisinopril (ZESTRIL) 40 MG tablet 90 tablet 1    Sig: TAKE 1 TABLET(40 MG) BY MOUTH DAILY     Cardiovascular:  ACE Inhibitors Passed - 04/19/2021 12:36 PM      Passed - Cr in normal range and within 180 days    Creatinine, Ser  Date Value Ref Range Status  12/22/2020 0.81 0.57 - 1.00 mg/dL Final         Passed - K in normal range and within 180 days    Potassium  Date Value Ref Range Status  12/22/2020 4.2 3.5 - 5.2 mmol/L Final         Passed - Patient is not pregnant      Passed - Last BP in normal range    BP Readings from Last 1 Encounters:  02/23/21 115/60         Passed - Valid encounter within last 6 months    Recent Outpatient Visits          1 month ago Primary hypertension   [facility] Jerrol Banana., MD   3 months ago Anemia, unspecified type   [facility] Jerrol Banana., MD   5 months ago Seasonal allergic rhinitis due to other allergic trigger   [place], Vickki Muff, PA-C   8 months ago Primary hypertension   [facility] Jerrol Banana., MD   9 months ago Herpes zoster without complication   [facility] Jerrol Banana., MD      Future Appointments            In 4 months Jerrol Banana., MD [facility] [place]

## 2021-04-19 NOTE — Telephone Encounter (Signed)
Medication Refill - Medication:  lisinopril (ZESTRIL) 40 MG tablet   Has the patient contacted their pharmacy? Yes.   Contact PCP  Preferred Pharmacy (with phone number or street name):  [id number] Lorina Rabon, [place]  [address] [place], [place] [address]  Phone:  [phone]  Fax:  [phone]   Has the patient been seen for an appointment in the last year OR does the patient have an upcoming appointment? Yes.    Agent: Please be advised that RX refills may take up to 3 business days. We ask that you follow-up with your pharmacy.

## 2021-04-20 NOTE — Progress Notes (Signed)
[place]  [phone] [phone]  ID: Jocelyn Harris OB: 03/20/1951  MR#: [id number]  UXL#:[id number]  Patient Care Team: Jerrol Banana., MD as PCP - General (Family Medicine) Rubie Maid, MD as Referring Physician (Obstetrics and Gynecology)  CHIEF COMPLAINT: Iron deficiency anemia.  INTERVAL HISTORY: Patient returns to clinic today for repeat laboratory work and further evaluation.  She continues to have chronic weakness and fatigue, but admits this has improved since receiving IV iron several months ago.  She otherwise feels well.  She has no neurologic complaints.  She denies any recent fevers or illnesses.  She has a good appetite and denies weight loss.  She has no chest pain, shortness of breath, cough, or hemoptysis.  She denies any nausea, vomiting, constipation, or diarrhea.  She has no melena or hematochezia.  She has no urinary complaints.  Patient offers no further specific complaints today.  REVIEW OF SYSTEMS:   Review of Systems  Constitutional:  Positive for malaise/fatigue. Negative for fever and weight loss.  Respiratory: Negative.  Negative for cough, hemoptysis and shortness of breath.   Cardiovascular: Negative.  Negative for chest pain and leg swelling.  Gastrointestinal: Negative.  Negative for abdominal pain, blood in stool, constipation, heartburn and melena.  Genitourinary: Negative.  Negative for frequency.  Musculoskeletal: Negative.  Negative for back pain.  Skin: Negative.  Negative for itching and rash.  Neurological: Negative.  Negative for dizziness, speech change, focal weakness and headaches.  Psychiatric/Behavioral: Negative.  The patient is not nervous/anxious.    As per HPI. Otherwise, a complete review of systems is negative.  PAST MEDICAL HISTORY: Past Medical History:  Diagnosis Date   GERD (gastroesophageal reflux disease)    Hypertension    TMJ (dislocation of temporomandibular joint)      PAST SURGICAL HISTORY: Past Surgical History:  Procedure Laterality Date   COLONOSCOPY WITH PROPOFOL N/A 12/01/2020   Procedure: COLONOSCOPY WITH PROPOFOL;  Surgeon: Virgel Manifold, MD;  Location: [facility];  Service: Endoscopy;  Laterality: N/A;   ESOPHAGOGASTRODUODENOSCOPY (EGD) WITH PROPOFOL N/A 12/01/2020   Procedure: ESOPHAGOGASTRODUODENOSCOPY (EGD) WITH PROPOFOL;  Surgeon: Virgel Manifold, MD;  Location: [facility];  Service: Endoscopy;  Laterality: N/A;   GIVENS CAPSULE STUDY N/A 03/07/2021   Procedure: GIVENS CAPSULE STUDY;  Surgeon: Virgel Manifold, MD;  Location: [facility];  Service: Endoscopy;  Laterality: N/A;   KNEE SURGERY Right    TOE SURGERY     TUBAL LIGATION      FAMILY HISTORY: Family History  Problem Relation Age of Onset   Diabetes Mother    Hypertension Mother    Congestive Heart Failure Mother    Hypertension Father    Lung cancer Father    Fibromyalgia Sister    Lupus Sister    Cancer Sister        of blood   Leukemia Paternal Grandmother    Healthy Daughter     ADVANCED DIRECTIVES (Y/N):  N  HEALTH MAINTENANCE: Social History   Tobacco Use   Smoking status: Never   Smokeless tobacco: Never  Vaping Use   Vaping Use: Never used  Substance Use Topics   Alcohol use: No    Alcohol/week: 0.0 standard drinks   Drug use: No     Colonoscopy:  PAP:  Bone density:  Lipid panel:  Allergies  Allergen Reactions   Augmentin [Amoxicillin-Pot Clavulanate] Nausea Only    Patient reports that her reflux was bad when she took medication  Doxycycline Nausea Only and Nausea And Vomiting    Current Outpatient Medications  Medication Sig Dispense Refill   Biotin 1 MG CAPS Take by mouth.     busPIRone (BUSPAR) 10 MG tablet TAKE 1 TABLET(10 MG) BY MOUTH THREE TIMES DAILY 90 tablet 4   calcium gluconate 500 MG tablet Take 1 tablet by mouth daily.      cetirizine (ZYRTEC) 10 MG tablet Take 1 tablet (10 mg total) by mouth  daily. 90 tablet 1   Cholecalciferol 1000 UNITS capsule Take 1,000 Units by mouth daily.     cyclobenzaprine (FLEXERIL) 10 MG tablet Take 1 tablet (10 mg total) by mouth at bedtime as needed for muscle spasms. 30 tablet 1   fluticasone (FLONASE) 50 MCG/ACT nasal spray SHAKE LIQUID AND USE 2 SPRAYS IN EACH NOSTRIL DAILY 16 g 2   gabapentin (NEURONTIN) 100 MG capsule TAKE 1 CAPSULE(100 MG) BY MOUTH THREE TIMES DAILY 90 capsule 1   hydrochlorothiazide (HYDRODIURIL) 25 MG tablet Take 1 tablet (25 mg total) by mouth daily. 30 tablet 12   lisinopril (ZESTRIL) 40 MG tablet TAKE 1 TABLET(40 MG) BY MOUTH DAILY 90 tablet 1   Lysine 500 MG TABS Take by mouth as needed.      montelukast (SINGULAIR) 10 MG tablet TAKE 1 TABLET BY MOUTH EVERY NIGHT AT BEDTIME 90 tablet 1   MULTIPLE VITAMINS PO Take by mouth daily.     Omega-3 Fatty Acids (FISH OIL) 1000 MG CAPS Take by mouth daily.     omeprazole (PRILOSEC) 40 MG capsule Take 1 capsule (40 mg total) by mouth in the morning and at bedtime. 60 capsule 5   polyethylene glycol powder (GLYCOLAX/MIRALAX) 17 GM/SCOOP powder Take 17 g by mouth daily. 507 g 1   Probiotic Product (PROBIOTIC ADVANCED PO) Take by mouth.     psyllium (REGULOID) 0.52 g capsule Take 0.52 g by mouth daily.     sucralfate (CARAFATE) 1 g tablet Take 1 tablet (1 g total) by mouth 4 (four) times daily -  with meals and at bedtime. 120 tablet 11   traZODone (DESYREL) 150 MG tablet TAKE 1 TABLET(150 MG) BY MOUTH AT BEDTIME 30 tablet 4   vitamin E 100 UNIT capsule Take 100 Units by mouth daily.     No current facility-administered medications for this visit.    OBJECTIVE: Vitals:   04/25/21 1258  BP: 124/65  Pulse: 61  Resp: 16  Temp: 98.7 F (37.1 C)  SpO2: 97%     Body mass index is 22.64 kg/m.    ECOG FS:0 - Asymptomatic  General: Well-developed, well-nourished, no acute distress. Eyes: Pink conjunctiva, anicteric sclera. HEENT: Normocephalic, moist mucous membranes. Lungs: No  audible wheezing or coughing. Heart: Regular rate and rhythm. Abdomen: Soft, nontender, no obvious distention. Musculoskeletal: No edema, cyanosis, or clubbing. Neuro: Alert, answering all questions appropriately. Cranial nerves grossly intact. Skin: No rashes or petechiae noted. Psych: Normal affect.  LAB RESULTS:  Lab Results  Component Value Date   NA 141 12/22/2020   K 4.2 12/22/2020   CL 102 12/22/2020   CO2 26 12/22/2020   GLUCOSE 83 12/22/2020   BUN 14 12/22/2020   CREATININE 0.81 12/22/2020   CALCIUM 9.8 12/22/2020   PROT 6.6 12/22/2020   ALBUMIN 4.6 12/22/2020   AST 11 12/22/2020   ALT 10 12/22/2020   ALKPHOS 72 12/22/2020   BILITOT 0.2 12/22/2020   GFRNONAA 80 01/28/2020   GFRAA 92 01/28/2020    Lab Results  Component  Value Date   WBC 7.0 04/25/2021   NEUTROABS 3.7 04/25/2021   HGB 12.5 04/25/2021   HCT 38.6 04/25/2021   MCV 90.6 04/25/2021   PLT 229 04/25/2021   Lab Results  Component Value Date   IRON 62 04/25/2021   TIBC 302 04/25/2021   IRONPCTSAT 21 04/25/2021   Lab Results  Component Value Date   FERRITIN 52 04/25/2021     STUDIES: No results found.  ASSESSMENT: Iron deficiency anemia.    PLAN:    Iron deficiency anemia: Patient's hemoglobin and iron stores are now within normal limits.  Previously, all of her other laboratory work was either negative or within normal limits.  Patient reports constipation with oral iron supplementation, therefore she received 3 doses of 200 mg IV Venofer.  Patient was last treated on January 26, 2021.  No intervention is needed at this time.  Return to clinic in 3 months with repeat laboratory work, further evaluation, and continuation of treatment if needed.     I spent a total of 20 minutes reviewing chart data, face-to-face evaluation with the patient, counseling and coordination of care as detailed above.   Patient expressed understanding and was in agreement with this plan. She also understands that  She can call clinic at any time with any questions, concerns, or complaints.    Lloyd Huger, MD   04/25/2021 6:10 PM

## 2021-04-24 ENCOUNTER — Ambulatory Visit: Payer: Self-pay | Admitting: *Deleted

## 2021-04-24 ENCOUNTER — Other Ambulatory Visit: Payer: Self-pay | Admitting: Emergency Medicine

## 2021-04-24 DIAGNOSIS — D509 Iron deficiency anemia, unspecified: Secondary | ICD-10-CM

## 2021-04-24 NOTE — Telephone Encounter (Signed)
Pt called back she would really like to see Dr. Rosanna Randy this week in the afternoon. There are no appointments available on his schedule.

## 2021-04-24 NOTE — Telephone Encounter (Signed)
Pt states her bp has been up and down. She has not been feeling well. 157/81  137/75 she has been keeping track, but none in the "red"  she wanted appt with Dr Rosanna Randy, but needed late afternoon.   Attempted to contact patient- left message to call office

## 2021-04-25 ENCOUNTER — Inpatient Hospital Stay: Payer: Medicare HMO | Attending: Oncology

## 2021-04-25 ENCOUNTER — Other Ambulatory Visit: Payer: Self-pay

## 2021-04-25 ENCOUNTER — Inpatient Hospital Stay: Payer: Medicare HMO

## 2021-04-25 ENCOUNTER — Inpatient Hospital Stay: Payer: Medicare HMO | Admitting: Oncology

## 2021-04-25 VITALS — BP 124/65 | HR 61 | Temp 98.7°F | Resp 16 | Wt 119.8 lb

## 2021-04-25 DIAGNOSIS — D509 Iron deficiency anemia, unspecified: Secondary | ICD-10-CM | POA: Insufficient documentation

## 2021-04-25 LAB — CBC WITH DIFFERENTIAL/PLATELET
Abs Immature Granulocytes: 0.02 10*3/uL (ref 0.00–0.07)
Basophils Absolute: 0.1 10*3/uL (ref 0.0–0.1)
Basophils Relative: 1 %
Eosinophils Absolute: 0.1 10*3/uL (ref 0.0–0.5)
Eosinophils Relative: 1 %
HCT: 38.6 % (ref 36.0–46.0)
Hemoglobin: 12.5 g/dL (ref 12.0–15.0)
Immature Granulocytes: 0 %
Lymphocytes Relative: 40 %
Lymphs Abs: 2.8 10*3/uL (ref 0.7–4.0)
MCH: 29.3 pg (ref 26.0–34.0)
MCHC: 32.4 g/dL (ref 30.0–36.0)
MCV: 90.6 fL (ref 80.0–100.0)
Monocytes Absolute: 0.4 10*3/uL (ref 0.1–1.0)
Monocytes Relative: 6 %
Neutro Abs: 3.7 10*3/uL (ref 1.7–7.7)
Neutrophils Relative %: 52 %
Platelets: 229 10*3/uL (ref 150–400)
RBC: 4.26 MIL/uL (ref 3.87–5.11)
RDW: 13.1 % (ref 11.5–15.5)
WBC: 7 10*3/uL (ref 4.0–10.5)
nRBC: 0 % (ref 0.0–0.2)

## 2021-04-25 LAB — IRON AND TIBC
Iron: 62 ug/dL (ref 28–170)
Saturation Ratios: 21 % (ref 10.4–31.8)
TIBC: 302 ug/dL (ref 250–450)
UIBC: 240 ug/dL

## 2021-04-25 LAB — FERRITIN: Ferritin: 52 ng/mL (ref 11–307)

## 2021-04-25 NOTE — Progress Notes (Signed)
Pt c/o increased fatigue x 2 months, intermittent high bp, headaches and lightheadedness. Pt states she has appt with PCP to discuss these concerns as well.

## 2021-04-26 ENCOUNTER — Ambulatory Visit: Payer: Medicare HMO | Admitting: Family Medicine

## 2021-04-27 ENCOUNTER — Other Ambulatory Visit: Payer: Self-pay

## 2021-04-27 ENCOUNTER — Ambulatory Visit (INDEPENDENT_AMBULATORY_CARE_PROVIDER_SITE_OTHER): Payer: Medicare HMO | Admitting: Family Medicine

## 2021-04-27 ENCOUNTER — Encounter: Payer: Self-pay | Admitting: Family Medicine

## 2021-04-27 VITALS — BP 125/77 | HR 61 | Temp 97.9°F | Ht 61.0 in | Wt 119.0 lb

## 2021-04-27 DIAGNOSIS — H524 Presbyopia: Secondary | ICD-10-CM | POA: Diagnosis not present

## 2021-04-27 DIAGNOSIS — F419 Anxiety disorder, unspecified: Secondary | ICD-10-CM

## 2021-04-27 DIAGNOSIS — M5441 Lumbago with sciatica, right side: Secondary | ICD-10-CM | POA: Diagnosis not present

## 2021-04-27 DIAGNOSIS — G8929 Other chronic pain: Secondary | ICD-10-CM

## 2021-04-27 DIAGNOSIS — F3341 Major depressive disorder, recurrent, in partial remission: Secondary | ICD-10-CM

## 2021-04-27 DIAGNOSIS — I10 Essential (primary) hypertension: Secondary | ICD-10-CM | POA: Diagnosis not present

## 2021-04-27 DIAGNOSIS — G4709 Other insomnia: Secondary | ICD-10-CM | POA: Diagnosis not present

## 2021-04-27 DIAGNOSIS — M5442 Lumbago with sciatica, left side: Secondary | ICD-10-CM | POA: Diagnosis not present

## 2021-04-27 DIAGNOSIS — K219 Gastro-esophageal reflux disease without esophagitis: Secondary | ICD-10-CM

## 2021-04-27 DIAGNOSIS — M545 Low back pain, unspecified: Secondary | ICD-10-CM | POA: Diagnosis not present

## 2021-04-27 DIAGNOSIS — J3089 Other allergic rhinitis: Secondary | ICD-10-CM | POA: Diagnosis not present

## 2021-04-27 MED ORDER — CETIRIZINE HCL 10 MG PO TABS: 10.0000 mg | ORAL_TABLET | Freq: Every day | ORAL | 1 refills | Status: DC

## 2021-04-27 MED ORDER — PREDNISONE 10 MG PO TABS: 10.0000 mg | ORAL_TABLET | Freq: Every day | ORAL | 0 refills | Status: DC

## 2021-04-27 NOTE — Patient Instructions (Signed)
DO BACK EXERCISES AT HOME.

## 2021-04-27 NOTE — Progress Notes (Signed)
I,[name],acting as a scribe for Wilhemena Durie, MD.,have documented all relevant documentation on the behalf of Wilhemena Durie, MD,as directed by  Wilhemena Durie, MD while in the presence of Wilhemena Durie, MD.   Established patient visit   Patient: Jocelyn Harris   DOB: 09-Mar-1951   70 y.o. Female  MRN: [id number] Visit Date: 04/27/2021  Today's healthcare provider: Wilhemena Durie, MD   Chief Complaint  Patient presents with   Follow-up   Hypertension   Subjective    HPI  Patient has ongoing chronic problems.  Low back pain headache allergies. Gabapentin appears to be helping her back pain.  He has chronic allergies and chronic headache which is unchanged. Hypertension, follow-up  BP Readings from Last 3 Encounters:  04/27/21 125/77  04/25/21 124/65  02/23/21 115/60   Wt Readings from Last 3 Encounters:  04/27/21 119 lb (54 kg)  04/25/21 119 lb 12.8 oz (54.3 kg)  02/23/21 121 lb (54.9 kg)     She was last seen for hypertension 2 months ago.  BP at that visit was 115/60. Management since that visit includes; Good control. She reports good compliance with treatment. She is not having side effects. none She is not exercising. She is not adherent to low salt diet.   Outside blood pressures are 157/67 .  She does not smoke.  Use of agents associated with hypertension: none.   --------------------------------------------------------------------------------------------------- Pt Bp have been up and down and having headaches.    Medications: Outpatient Medications Prior to Visit  Medication Sig   Biotin 1 MG CAPS Take by mouth.   busPIRone (BUSPAR) 10 MG tablet TAKE 1 TABLET(10 MG) BY MOUTH THREE TIMES DAILY   calcium gluconate 500 MG tablet Take 1 tablet by mouth daily.    cetirizine (ZYRTEC) 10 MG tablet Take 1 tablet (10 mg total) by mouth daily.   Cholecalciferol 1000 UNITS capsule Take 1,000 Units by mouth daily.    cyclobenzaprine (FLEXERIL) 10 MG tablet Take 1 tablet (10 mg total) by mouth at bedtime as needed for muscle spasms.   fluticasone (FLONASE) 50 MCG/ACT nasal spray SHAKE LIQUID AND USE 2 SPRAYS IN EACH NOSTRIL DAILY   gabapentin (NEURONTIN) 100 MG capsule TAKE 1 CAPSULE(100 MG) BY MOUTH THREE TIMES DAILY   hydrochlorothiazide (HYDRODIURIL) 25 MG tablet Take 1 tablet (25 mg total) by mouth daily.   lisinopril (ZESTRIL) 40 MG tablet TAKE 1 TABLET(40 MG) BY MOUTH DAILY   Lysine 500 MG TABS Take by mouth as needed.    montelukast (SINGULAIR) 10 MG tablet TAKE 1 TABLET BY MOUTH EVERY NIGHT AT BEDTIME   MULTIPLE VITAMINS PO Take by mouth daily.   Omega-3 Fatty Acids (FISH OIL) 1000 MG CAPS Take by mouth daily.   omeprazole (PRILOSEC) 40 MG capsule Take 1 capsule (40 mg total) by mouth in the morning and at bedtime.   polyethylene glycol powder (GLYCOLAX/MIRALAX) 17 GM/SCOOP powder Take 17 g by mouth daily.   Probiotic Product (PROBIOTIC ADVANCED PO) Take by mouth.   psyllium (REGULOID) 0.52 g capsule Take 0.52 g by mouth daily.   sucralfate (CARAFATE) 1 g tablet Take 1 tablet (1 g total) by mouth 4 (four) times daily -  with meals and at bedtime.   traZODone (DESYREL) 150 MG tablet TAKE 1 TABLET(150 MG) BY MOUTH AT BEDTIME   vitamin E 100 UNIT capsule Take 100 Units by mouth daily.   No facility-administered medications prior to visit.  Review of Systems  Constitutional:  Negative for appetite change, chills, fatigue and fever.  Respiratory:  Negative for chest tightness and shortness of breath.   Cardiovascular:  Negative for chest pain and palpitations.  Gastrointestinal:  Negative for abdominal pain, nausea and vomiting.  Neurological:  Negative for dizziness and weakness.      Objective    BP 125/77 (BP Location: Right Arm, Patient Position: Sitting, Cuff Size: Normal)   Pulse 61   Temp 97.9 F (36.6 C)   Ht 5\' 1"  (1.549 m)   Wt 119 lb (54 kg)   SpO2 98%   BMI 22.48 kg/m  {Show  previous vital signs (optional):23777}  Physical Exam Vitals reviewed.  Constitutional:      Appearance: She is well-developed.  HENT:     Head: Normocephalic and atraumatic.     Right Ear: External ear normal.     Left Ear: External ear normal.     Nose: Nose normal.  Eyes:     Conjunctiva/sclera: Conjunctivae normal.     Pupils: Pupils are equal, round, and reactive to light.  Cardiovascular:     Rate and Rhythm: Normal rate and regular rhythm.     Heart sounds: Normal heart sounds.  Pulmonary:     Effort: Pulmonary effort is normal.     Breath sounds: Normal breath sounds.  Abdominal:     General: Bowel sounds are normal.     Palpations: Abdomen is soft.     Tenderness: There is no abdominal tenderness.  Genitourinary:    Rectum: Internal hemorrhoid present.  Musculoskeletal:     Cervical back: Normal range of motion and neck supple.     Right lower leg: No edema.     Left lower leg: No edema.  Skin:    General: Skin is warm and dry.  Neurological:     General: No focal deficit present.     Mental Status: She is alert and oriented to person, place, and time.     Comments: Straight  leg raise is negative bilaterally and strength is normal in both lower EXTR  Psychiatric:        Mood and Affect: Mood normal.        Behavior: Behavior normal.        Thought Content: Thought content normal.        Judgment: Judgment normal.      No results found for any visits on 04/27/21.  Assessment & Plan     1. Primary hypertension Controlled on HCTZ and lisinopril 40  2. Seasonal allergic rhinitis due to other allergic trigger Refill Zyrtec, also on montelukast - cetirizine (ZYRTEC) 10 MG tablet; Take 1 tablet (10 mg total) by mouth daily.  Dispense: 90 tablet; Refill: 1  3. Acute bilateral low back pain with bilateral sciatica Going issue.  Continue gabapentin.  May need to increase dose in the future.  Physical therapy may help - predniSONE (DELTASONE) 10 MG tablet; Take  1 tablet (10 mg total) by mouth daily with breakfast.  Dispense: 5 tablet; Refill: 0  4. Gastroesophageal reflux disease, unspecified whether esophagitis present Also followed by GI.  Tinea omeprazole indefinitely  5. Recurrent major depressive disorder, in partial remission ([place]) Clinically stable since the death of her fianc.  She is on BuSpar and trazodone  6. Anxiety BuSpar.  Would avoid benzodiazepine  7. Other insomnia Some help with trazodone.  8. Chronic bilateral low back pain without sciatica Consider physical therapy   No follow-ups on  file.      I, Wilhemena Durie, MD, have reviewed all documentation for this visit. The documentation on 04/30/21 for the exam, diagnosis, procedures, and orders are all accurate and complete.    Recardo Linn Cranford Mon, MD  [facility] [phone] (phone) [phone] (fax)  [place]

## 2021-05-03 ENCOUNTER — Telehealth: Payer: Self-pay | Admitting: Family Medicine

## 2021-05-03 NOTE — Telephone Encounter (Signed)
[place] faxed refill request for the following medications:  cetirizine (ZYRTEC) 10 MG tablet   Please advise.

## 2021-05-04 NOTE — Telephone Encounter (Signed)
Rx was sent to pharmacy 04/27/2021.
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
## 2021-05-26 ENCOUNTER — Other Ambulatory Visit: Payer: Self-pay | Admitting: Family Medicine

## 2021-05-26 NOTE — Telephone Encounter (Signed)
Requested Prescriptions  Pending Prescriptions Disp Refills  . gabapentin (NEURONTIN) 100 MG capsule [Pharmacy Med Name: GABAPENTIN 100MG  CAPSULES] 90 capsule 2    Sig: TAKE 1 CAPSULE(100 MG) BY MOUTH THREE TIMES DAILY     Neurology: Anticonvulsants - gabapentin Passed - 05/26/2021  3:41 AM      Passed - Valid encounter within last 12 months    Recent Outpatient Visits          4 weeks ago Primary hypertension   [facility] Jerrol Banana., MD   3 months ago Primary hypertension   [facility] Jerrol Banana., MD   5 months ago Anemia, unspecified type   [facility] Jerrol Banana., MD   6 months ago Seasonal allergic rhinitis due to other allergic trigger   [facility] Chrismon, Vickki Muff, [place]   9 months ago Primary hypertension   [facility] Jerrol Banana., MD      Future Appointments            In 3 months Jerrol Banana., MD [facility], [place]

## 2021-06-14 DIAGNOSIS — Z0100 Encounter for examination of eyes and vision without abnormal findings: Secondary | ICD-10-CM | POA: Diagnosis not present

## 2021-06-20 ENCOUNTER — Ambulatory Visit: Payer: Self-pay

## 2021-06-20 ENCOUNTER — Encounter: Payer: Self-pay | Admitting: Oncology

## 2021-06-20 ENCOUNTER — Telehealth: Payer: Self-pay

## 2021-06-20 NOTE — Telephone Encounter (Signed)
Pt has appt with PCP 06/23/21 f/u also scheduled with Dr Marius Ditch for 06/29/2021

## 2021-06-20 NOTE — Telephone Encounter (Signed)
°  Chief Complaint: abdominal pain Symptoms: abdominal pain and diarrhea Frequency: 2 weeks Pertinent Negatives: NA Disposition: [] ED /[] Urgent Care (no appt availability in office) / [x] Appointment(In office/virtual)/ []  [facility]/ [] Home Care/ [] Refused Recommended Disposition /[] [place] [facility]/ []  Follow-up with PCP Additional Notes: Pt states she has been having stomach pain and diarrhea for 2 weeks now. Stools are very loose and pain is constant. Pt also reports back pain as well. Agent had scheduled appt but pt needed to reschedule so pt has appt on 06/23/21 at 1000. Care advice given and pt verbalized understanding. No other questions/concerns noted.     Reason for Disposition  [1] MODERATE pain (e.g., interferes with normal activities) AND [2] pain comes and goes (cramps) AND [3] present > 24 hours  (Exception: pain with Vomiting or Diarrhea - see that Guideline)  Answer Assessment - Initial Assessment Questions 1. LOCATION: "Where does it hurt?"      Upper in middle  2. RADIATION: "Does the pain shoot anywhere else?" (e.g., chest, back)     No 3. ONSET: "When did the pain begin?" (e.g., minutes, hours or days ago)      2 weeks 4. SUDDEN: "Gradual or sudden onset?"     gradual 5. PATTERN "Does the pain come and go, or is it constant?"    - If constant: "Is it getting better, staying the same, or worsening?"      (Note: Constant means the pain never goes away completely; most serious pain is constant and it progresses)     - If intermittent: "How long does it last?" "Do you have pain now?"     (Note: Intermittent means the pain goes away completely between bouts)     constant 6. SEVERITY: "How bad is the pain?"  (e.g., Scale 1-10; mild, moderate, or severe)   - MILD (1-3): doesn't interfere with normal activities, abdomen soft and not tender to touch    - MODERATE (4-7): interferes with normal activities or awakens from sleep, abdomen tender to touch    -  SEVERE (8-10): excruciating pain, doubled over, unable to do any normal activities      8/10  9. RELIEVING/AGGRAVATING FACTORS: "What makes it better or worse?" (e.g., movement, antacids, bowel movement)     Nothing better, eating worse burning feeling 10. OTHER SYMPTOMS: "Do you have any other symptoms?" (e.g., back pain, diarrhea, fever, urination pain, vomiting)       Increased gas, back pain, diarrhea, no appetite  Protocols used: Abdominal Pain - Female-A-AH

## 2021-06-21 ENCOUNTER — Ambulatory Visit: Payer: Medicare HMO | Admitting: Family Medicine

## 2021-06-22 ENCOUNTER — Other Ambulatory Visit: Payer: Self-pay

## 2021-06-23 ENCOUNTER — Ambulatory Visit (INDEPENDENT_AMBULATORY_CARE_PROVIDER_SITE_OTHER): Payer: No Typology Code available for payment source | Admitting: Physician Assistant

## 2021-06-23 ENCOUNTER — Encounter: Payer: Self-pay | Admitting: Physician Assistant

## 2021-06-23 ENCOUNTER — Encounter: Payer: Self-pay | Admitting: Oncology

## 2021-06-23 ENCOUNTER — Other Ambulatory Visit: Payer: Self-pay

## 2021-06-23 ENCOUNTER — Other Ambulatory Visit: Payer: Self-pay | Admitting: Family Medicine

## 2021-06-23 VITALS — BP 149/63 | HR 86 | Temp 98.6°F | Wt 126.0 lb

## 2021-06-23 DIAGNOSIS — D649 Anemia, unspecified: Secondary | ICD-10-CM | POA: Diagnosis not present

## 2021-06-23 DIAGNOSIS — J069 Acute upper respiratory infection, unspecified: Secondary | ICD-10-CM | POA: Insufficient documentation

## 2021-06-23 DIAGNOSIS — I10 Essential (primary) hypertension: Secondary | ICD-10-CM | POA: Diagnosis not present

## 2021-06-23 DIAGNOSIS — K633 Ulcer of intestine: Secondary | ICD-10-CM | POA: Diagnosis not present

## 2021-06-23 DIAGNOSIS — R159 Full incontinence of feces: Secondary | ICD-10-CM | POA: Insufficient documentation

## 2021-06-23 DIAGNOSIS — F32A Depression, unspecified: Secondary | ICD-10-CM

## 2021-06-23 MED ORDER — AZELASTINE HCL 0.1 % NA SOLN: 2.0000 | Freq: Two times a day (BID) | NASAL | 3 refills | Status: AC

## 2021-06-23 NOTE — Assessment & Plan Note (Addendum)
From history, sounds secondary to Mirilax use. Advised for constipation relief to try Metamucil instead of Mirilax. Has GI appt sched.

## 2021-06-23 NOTE — Patient Instructions (Signed)
Metamucil stop Mirilax

## 2021-06-23 NOTE — Assessment & Plan Note (Signed)
Elevated today but pt feeling unwell. Will monitor and recheck next visit

## 2021-06-23 NOTE — Telephone Encounter (Signed)
Requested Prescriptions  Pending Prescriptions Disp Refills   traZODone (DESYREL) 150 MG tablet [Pharmacy Med Name: TRAZODONE 150MG  (HUNDRED-FIFTY) TAB] 30 tablet 4    Sig: TAKE 1 TABLET(150 MG) BY MOUTH AT BEDTIME     Psychiatry: Antidepressants - Serotonin Modulator Passed - 06/23/2021  3:42 AM      Passed - Completed PHQ-2 or PHQ-9 in the last 360 days      Passed - Valid encounter within last 6 months    Recent Outpatient Visits          Today Sinus pressure   [facility] Mikey Kirschner, PA-C   1 month ago Primary hypertension   [facility] Jerrol Banana., MD   4 months ago Primary hypertension   [facility] Jerrol Banana., MD   6 months ago Anemia, unspecified type   [facility] Jerrol Banana., MD   7 months ago Seasonal allergic rhinitis due to other allergic trigger   [place], PA-C      Future Appointments            In 6 days Vanga, Tally Due, MD [place]   In 2 months Jerrol Banana., MD [facility], [place]

## 2021-06-23 NOTE — Assessment & Plan Note (Signed)
Increased fatigue, was going to recheck in 2-3 months but will recheck d/t history of ulcer and increased pain and fatigue.

## 2021-06-23 NOTE — Progress Notes (Signed)
Acute Office Visit  Subjective:    Patient ID: Jocelyn Harris, female    DOB: [birth year], 71 y.o.   MRN: [id number]     HPI Todd is a 71 y/o female who presents today with multiple concerns.  She reports she has significant nasal congestion, sinus pressure, ear pain, ear fullness, and feels off balance for the last week. She also feels fatigued. Denies fevers, chills, CP, SOB, cough, PND.  She also reports today for evaluation of abdominal pain and diarrhea for 2 weeks. She states she has been having abdominal pains, sometimes sharp and loose stools.  She also states she has been having a lot of very foul smelling gas. Although the stool has been loose she states she feels she is not having them enough.  She has had a little bit of bowel incontinence at night.   The loose stools started occurring after she felt constipated and started taking Mirilax.  She denies any changes in her diet, medicine or activity. Reports Mucinex has been helpful with her symptoms.   Past Medical History:  Diagnosis Date   GERD (gastroesophageal reflux disease)    Hypertension    TMJ (dislocation of temporomandibular joint)     Past Surgical History:  Procedure Laterality Date   COLONOSCOPY WITH PROPOFOL N/A 12/01/2020   Procedure: COLONOSCOPY WITH PROPOFOL;  Surgeon: Virgel Manifold, MD;  Location: [facility] ENDOSCOPY;  Service: Endoscopy;  Laterality: N/A;   ESOPHAGOGASTRODUODENOSCOPY (EGD) WITH PROPOFOL N/A 12/01/2020   Procedure: ESOPHAGOGASTRODUODENOSCOPY (EGD) WITH PROPOFOL;  Surgeon: Virgel Manifold, MD;  Location: [facility] ENDOSCOPY;  Service: Endoscopy;  Laterality: N/A;   GIVENS CAPSULE STUDY N/A 03/07/2021   Procedure: GIVENS CAPSULE STUDY;  Surgeon: Virgel Manifold, MD;  Location: [facility] ENDOSCOPY;  Service: Endoscopy;  Laterality: N/A;   KNEE SURGERY Right    TOE SURGERY     TUBAL LIGATION      Family History  Problem Relation Age of Onset   Diabetes Mother    Hypertension  Mother    Congestive Heart Failure Mother    Hypertension Father    Lung cancer Father    Fibromyalgia Sister    Lupus Sister    Cancer Sister        of blood   Leukemia Paternal Grandmother    Healthy Daughter     Social History   Socioeconomic History   Marital status: Divorced    Spouse name: Not on file   Number of children: 1   Years of education: Not on file   Highest education level: Some college, no degree  Occupational History   Occupation: customer service    Comment: part time  Tobacco Use   Smoking status: Never   Smokeless tobacco: Never  Vaping Use   Vaping Use: Never used  Substance and Sexual Activity   Alcohol use: No    Alcohol/week: 0.0 standard drinks   Drug use: No   Sexual activity: Not Currently    Birth control/protection: None  Other Topics Concern   Not on file  Social History Narrative   Not on file   Social Determinants of Health   Financial Resource Strain: Not on file  Food Insecurity: Not on file  Transportation Needs: Not on file  Physical Activity: Not on file  Stress: Not on file  Social Connections: Not on file  Intimate Partner Violence: Not on file    Outpatient Medications Prior to Visit  Medication Sig Dispense Refill   Biotin 1 MG  CAPS Take by mouth.     busPIRone (BUSPAR) 10 MG tablet TAKE 1 TABLET(10 MG) BY MOUTH THREE TIMES DAILY 90 tablet 4   calcium gluconate 500 MG tablet Take 1 tablet by mouth daily.      cetirizine (ZYRTEC) 10 MG tablet Take 1 tablet (10 mg total) by mouth daily. 90 tablet 1   Cholecalciferol 1000 UNITS capsule Take 1,000 Units by mouth daily.     cyclobenzaprine (FLEXERIL) 10 MG tablet Take 1 tablet (10 mg total) by mouth at bedtime as needed for muscle spasms. 30 tablet 1   fluticasone (FLONASE) 50 MCG/ACT nasal spray SHAKE LIQUID AND USE 2 SPRAYS IN EACH NOSTRIL DAILY 16 g 2   gabapentin (NEURONTIN) 100 MG capsule TAKE 1 CAPSULE(100 MG) BY MOUTH THREE TIMES DAILY 90 capsule 2    hydrochlorothiazide (HYDRODIURIL) 25 MG tablet Take 1 tablet (25 mg total) by mouth daily. 30 tablet 12   lisinopril (ZESTRIL) 40 MG tablet TAKE 1 TABLET(40 MG) BY MOUTH DAILY 90 tablet 1   Lysine 500 MG TABS Take by mouth as needed.      montelukast (SINGULAIR) 10 MG tablet TAKE 1 TABLET BY MOUTH EVERY NIGHT AT BEDTIME 90 tablet 1   MULTIPLE VITAMINS PO Take by mouth daily.     Omega-3 Fatty Acids (FISH OIL) 1000 MG CAPS Take by mouth daily.     omeprazole (PRILOSEC) 40 MG capsule Take 1 capsule (40 mg total) by mouth in the morning and at bedtime. 60 capsule 5   polyethylene glycol powder (GLYCOLAX/MIRALAX) 17 GM/SCOOP powder Take 17 g by mouth daily. 507 g 1   predniSONE (DELTASONE) 10 MG tablet Take 1 tablet (10 mg total) by mouth daily with breakfast. 5 tablet 0   Probiotic Product (PROBIOTIC ADVANCED PO) Take by mouth.     psyllium (REGULOID) 0.52 g capsule Take 0.52 g by mouth daily.     sucralfate (CARAFATE) 1 g tablet Take 1 tablet (1 g total) by mouth 4 (four) times daily -  with meals and at bedtime. 120 tablet 11   traZODone (DESYREL) 150 MG tablet TAKE 1 TABLET(150 MG) BY MOUTH AT BEDTIME 30 tablet 4   vitamin E 100 UNIT capsule Take 100 Units by mouth daily.     No facility-administered medications prior to visit.    Allergies  Allergen Reactions   Augmentin [Amoxicillin-Pot Clavulanate] Nausea Only    Patient reports that her reflux was bad when she took medication   Doxycycline Nausea Only and Nausea And Vomiting    Review of Systems  Constitutional:  Positive for chills, diaphoresis and fatigue. Negative for fever.  HENT:  Positive for ear pain, rhinorrhea, sinus pressure, sinus pain, sneezing and voice change. Negative for nosebleeds, postnasal drip, sore throat and tinnitus.   Gastrointestinal:  Positive for abdominal distention, abdominal pain, diarrhea and nausea. Negative for blood in stool and vomiting.      Objective:    Physical Exam Constitutional:       General: She is awake.     Appearance: She is well-developed.  HENT:     Head: Normocephalic.     Right Ear: Tympanic membrane normal.     Left Ear: Tympanic membrane normal.     Ears:     Comments: Clear fluid behind left TM w/ no sign of erythema, bulging, purulence.     Nose: Nose normal. No congestion or rhinorrhea.     Mouth/Throat:     Mouth: Mucous membranes are moist.  Pharynx: No oropharyngeal exudate or posterior oropharyngeal erythema.  Eyes:     Conjunctiva/sclera: Conjunctivae normal.  Cardiovascular:     Rate and Rhythm: Normal rate and regular rhythm.     Heart sounds: Normal heart sounds.  Pulmonary:     Effort: Pulmonary effort is normal.     Breath sounds: Normal breath sounds.  Abdominal:     General: There is no distension.     Palpations: Abdomen is soft.     Tenderness: There is abdominal tenderness in the epigastric area. There is no guarding.  Skin:    General: Skin is warm.  Neurological:     Mental Status: She is alert and oriented to person, place, and time.  Psychiatric:        Attention and Perception: Attention normal.        Mood and Affect: Mood normal.        Speech: Speech normal.        Behavior: Behavior is cooperative.    BP (!) 156/68 (BP Location: Left Arm, Patient Position: Sitting, Cuff Size: Normal)    Pulse 86    Temp 98.6 F (37 C) (Oral)    Wt 126 lb (57.2 kg)    SpO2 98%    BMI 23.81 kg/m  Wt Readings from Last 3 Encounters:  06/23/21 126 lb (57.2 kg)  04/27/21 119 lb (54 kg)  04/25/21 119 lb 12.8 oz (54.3 kg)   Vitals:   06/23/21 1012 06/23/21 1057  BP: (!) 156/68 (!) 149/63  Pulse: 86   Temp: 98.6 F (37 C)   SpO2: 98%     Health Maintenance Due  Topic Date Due   Zoster Vaccines- Shingrix (1 of 2) Never done   DEXA SCAN  04/30/2007   COVID-19 Vaccine (3 - Moderna risk series) 09/24/2019   Pneumonia Vaccine 80+ Years old (2 - PCV) 03/25/2020    There are no preventive care reminders to display for this  patient.   Lab Results  Component Value Date   TSH 1.790 12/22/2020   Lab Results  Component Value Date   WBC 7.0 04/25/2021   HGB 12.5 04/25/2021   HCT 38.6 04/25/2021   MCV 90.6 04/25/2021   PLT 229 04/25/2021   Lab Results  Component Value Date   NA 141 12/22/2020   K 4.2 12/22/2020   CO2 26 12/22/2020   GLUCOSE 83 12/22/2020   BUN 14 12/22/2020   CREATININE 0.81 12/22/2020   BILITOT 0.2 12/22/2020   ALKPHOS 72 12/22/2020   AST 11 12/22/2020   ALT 10 12/22/2020   PROT 6.6 12/22/2020   ALBUMIN 4.6 12/22/2020   CALCIUM 9.8 12/22/2020   EGFR 80 12/22/2020   Lab Results  Component Value Date   CHOL 235 (H) 01/28/2020   Lab Results  Component Value Date   HDL 58 01/28/2020   Lab Results  Component Value Date   LDLCALC 148 (H) 01/28/2020   Lab Results  Component Value Date   TRIG 161 (H) 01/28/2020   Lab Results  Component Value Date   CHOLHDL 4.1 01/28/2020   No results found for: HGBA1C     Assessment & Plan:   Problem List Items Addressed This Visit       Cardiovascular and Mediastinum   Hypertension    Elevated today but pt feeling unwell. Will monitor and recheck next visit        Respiratory   Viral upper respiratory tract infection - Primary    Rx  azelastine in addition to flonase.  Advised mucinex if helpful, saline nasal spray, humidifer at night. Gave work note for the next two days so she can rest. Encouraged COVID test.        Relevant Medications   azelastine (ASTELIN) 0.1 % nasal spray     Digestive   Ulceration of intestine    Historically on last endoscopy, at time asymptomatic and nonbleeding. I believe her pain is related to his, history of anemia as well.  She can take sulcrafate, f/u with GI as scheduled.       Relevant Orders   CBC     Other   Anemia    Increased fatigue, was going to recheck in 2-3 months but will recheck d/t history of ulcer and increased pain and fatigue.       Relevant Orders   CBC    Iron, TIBC and Ferritin Panel   Incontinence of bowel    From history, sounds secondary to Mirilax use. Advised for constipation relief to try Metamucil instead of Mirilax. Has GI appt sched.      Return in about 2 months (around 08/21/2021) for anemia, as scheduled.  I, Mikey Kirschner, PA-C have reviewed all documentation for this visit. The documentation on 06/23/2021  for the exam, diagnosis, procedures, and orders are all accurate and complete.

## 2021-06-23 NOTE — Assessment & Plan Note (Signed)
Historically on last endoscopy, at time asymptomatic and nonbleeding. I believe her pain is related to his, history of anemia as well.  She can take sulcrafate, f/u with GI as scheduled.

## 2021-06-23 NOTE — Assessment & Plan Note (Signed)
Rx azelastine in addition to flonase.  Advised mucinex if helpful, saline nasal spray, humidifer at night. Gave work note for the next two days so she can rest. Encouraged COVID test.

## 2021-06-24 LAB — CBC
Hematocrit: 37.4 % (ref 34.0–46.6)
Hemoglobin: 12.5 g/dL (ref 11.1–15.9)
MCH: 29.8 pg (ref 26.6–33.0)
MCHC: 33.4 g/dL (ref 31.5–35.7)
MCV: 89 fL (ref 79–97)
Platelets: 266 10*3/uL (ref 150–450)
RBC: 4.2 x10E6/uL (ref 3.77–5.28)
RDW: 13.2 % (ref 11.7–15.4)
WBC: 6.4 10*3/uL (ref 3.4–10.8)

## 2021-06-24 LAB — IRON,TIBC AND FERRITIN PANEL
Ferritin: 137 ng/mL (ref 15–150)
Iron Saturation: 27 % (ref 15–55)
Iron: 78 ug/dL (ref 27–139)
Total Iron Binding Capacity: 284 ug/dL (ref 250–450)
UIBC: 206 ug/dL (ref 118–369)

## 2021-06-29 ENCOUNTER — Encounter: Payer: Self-pay | Admitting: Oncology

## 2021-06-29 ENCOUNTER — Ambulatory Visit (INDEPENDENT_AMBULATORY_CARE_PROVIDER_SITE_OTHER): Payer: No Typology Code available for payment source | Admitting: Gastroenterology

## 2021-06-29 ENCOUNTER — Encounter: Payer: Self-pay | Admitting: Gastroenterology

## 2021-06-29 VITALS — BP 147/62 | HR 56 | Temp 98.1°F | Ht 61.0 in | Wt 125.2 lb

## 2021-06-29 DIAGNOSIS — K529 Noninfective gastroenteritis and colitis, unspecified: Secondary | ICD-10-CM

## 2021-06-29 DIAGNOSIS — R1013 Epigastric pain: Secondary | ICD-10-CM | POA: Diagnosis not present

## 2021-06-29 NOTE — Patient Instructions (Addendum)
Your CT scan is scheduled for 07/13/2021 arrive 3:00pm at outpatient imaging. Nothing to eat or drink 4 hours before the scan. Please pick up contrast before the scan. Address is [address] [place], [place] [address]. Phone number is [phone]

## 2021-06-29 NOTE — Progress Notes (Signed)
ab 

## 2021-06-29 NOTE — Progress Notes (Signed)
Cephas Darby, MD [address]  [place]  [place], [place] [address]  Main: [phone]  Fax: [phone]    Gastroenterology Consultation  Referring Provider:     Jerrol Banana.,* Primary Care Physician:  Jerrol Banana., MD Primary Gastroenterologist:  Dr. Bonna Gains Reason for Consultation:     Epigastric pain, diarrhea        HPI:   Jocelyn Harris is a 71 y.o. female referred by Dr. Rosanna Randy, Retia Passe., MD  for consultation & management of recurrence of epigastric pain associated with nonbloody loose stools, abdominal bloating.  Patient reports that she has history of IBS, diagnosed early years ago and since her boyfriend passed away from pancreatic cancer, she has remained anxious and has been experiencing recurrence of epigastric pain associated with nonbloody loose stools for last few weeks.  Patient denies any weight loss.  Patient has history of iron deficiency anemia, underwent work-up including upper endoscopy as well as colonoscopy which were unremarkable.  She had normal video capsule endoscopy as well.  Her iron deficiency anemia has resolved.  Her iron levels have been normal.  Patient has been on long-term PPI omeprazole 40 mg twice daily for epigastric burning pain as well as dysphagia.  Patient does not smoke or drink alcohol  NSAIDs: None  Antiplts/Anticoagulants/Anti thrombotics: None  GI Procedures:  EGD and colonoscopy 12/01/2020 - Salmon-colored mucosa suspicious for short-segment Barrett's esophagus. Biopsied. - Normal esophagus. Biopsied. - Multiple gastric polyps. Biopsied. - Normal stomach. Biopsied. - Normal duodenal bulb, second portion of the duodenum and examined duodenum. Biopsied. - Biopsies were obtained in the gastric body, at the incisura and in the gastric antrum.  - A single (solitary) ulcer in the terminal ileum. - Diverticulosis in the sigmoid colon. - The examination was otherwise normal. - The rectum,  sigmoid colon, descending colon, transverse colon, ascending colon and cecum are normal. - Non-bleeding internal hemorrhoids. - No specimens collected.  DIAGNOSIS:  A.  DUODENUM; COLD BIOPSY:  - DUODENAL MUCOSA WITH INTACT VILLI.  - NEGATIVE FOR ACTIVE INFLAMMATION, INTRAEPITHELIAL LYMPHOCYTOSIS, AND  INFECTIOUS AGENTS.   B.  STOMACH; COLD BIOPSY:  - MILD NONSPECIFIC CHRONIC GASTRITIS AND REACTIVE FOVEOLAR HYPERPLASIA.  - PROTON PUMP INHIBITOR EFFECT.  - ONE COARSE MUCOSAL CALCIFICATION, POSSIBLE PILL FRAGMENT.  - NEGATIVE FOR ACTIVE INFLAMMATION, H. PYLORI, INTESTINAL METAPLASIA,  DYSPLASIA, AND MALIGNANCY.   C.  STOMACH POLYP; COLD BIOPSY:  - FUNDIC GLAND POLYP.  - NEGATIVE FOR DYSPLASIA AND MALIGNANCY.   D.  GASTROESOPHAGEAL JUNCTION; COLD BIOPSY:  - SQUAMOCOLUMNAR MUCOSA WITH MILD CHRONIC INFLAMMATION.  - NEGATIVE FOR GOBLET CELLS, DYSPLASIA, AND MALIGNANCY.   E.  ESOPHAGUS; COLD BIOPSY:  - STRATIFIED SQUAMOUS EPITHELIUM WITHOUT EOSINOPHILS, NEUTROPHILS, OR  REACTIVE CHANGES.  - NEGATIVE FOR DYSPLASIA AND MALIGNANCY. Past Medical History:  Diagnosis Date   GERD (gastroesophageal reflux disease)    Hypertension    TMJ (dislocation of temporomandibular joint)     Past Surgical History:  Procedure Laterality Date   COLONOSCOPY WITH PROPOFOL N/A 12/01/2020   Procedure: COLONOSCOPY WITH PROPOFOL;  Surgeon: Virgel Manifold, MD;  Location: [facility];  Service: Endoscopy;  Laterality: N/A;   ESOPHAGOGASTRODUODENOSCOPY (EGD) WITH PROPOFOL N/A 12/01/2020   Procedure: ESOPHAGOGASTRODUODENOSCOPY (EGD) WITH PROPOFOL;  Surgeon: Virgel Manifold, MD;  Location: [facility];  Service: Endoscopy;  Laterality: N/A;   GIVENS CAPSULE STUDY N/A 03/07/2021   Procedure: GIVENS CAPSULE STUDY;  Surgeon: Virgel Manifold, MD;  Location: [facility]OS[facility];  Service: Endoscopy;  Laterality: N/A;   KNEE SURGERY Right    TOE SURGERY     TUBAL LIGATION     Current Outpatient  Medications:    azelastine (ASTELIN) 0.1 % nasal spray, Place 2 sprays into both nostrils 2 (two) times daily. Use in each nostril as directed, Disp: 30 mL, Rfl: 3   Biotin 1 MG CAPS, Take by mouth., Disp: , Rfl:    busPIRone (BUSPAR) 10 MG tablet, TAKE 1 TABLET(10 MG) BY MOUTH THREE TIMES DAILY, Disp: 90 tablet, Rfl: 4   calcium gluconate 500 MG tablet, Take 1 tablet by mouth daily. , Disp: , Rfl:    cetirizine (ZYRTEC) 10 MG tablet, Take 1 tablet (10 mg total) by mouth daily., Disp: 90 tablet, Rfl: 1   Cholecalciferol 1000 UNITS capsule, Take 1,000 Units by mouth daily., Disp: , Rfl:    cyclobenzaprine (FLEXERIL) 10 MG tablet, Take 1 tablet (10 mg total) by mouth at bedtime as needed for muscle spasms., Disp: 30 tablet, Rfl: 1   fluticasone (FLONASE) 50 MCG/ACT nasal spray, SHAKE LIQUID AND USE 2 SPRAYS IN EACH NOSTRIL DAILY, Disp: 16 g, Rfl: 2   gabapentin (NEURONTIN) 100 MG capsule, TAKE 1 CAPSULE(100 MG) BY MOUTH THREE TIMES DAILY, Disp: 90 capsule, Rfl: 2   hydrochlorothiazide (HYDRODIURIL) 25 MG tablet, Take 1 tablet (25 mg total) by mouth daily., Disp: 30 tablet, Rfl: 12   lisinopril (ZESTRIL) 40 MG tablet, TAKE 1 TABLET(40 MG) BY MOUTH DAILY, Disp: 90 tablet, Rfl: 1   Lysine 500 MG TABS, Take by mouth as needed. , Disp: , Rfl:    montelukast (SINGULAIR) 10 MG tablet, TAKE 1 TABLET BY MOUTH EVERY NIGHT AT BEDTIME, Disp: 90 tablet, Rfl: 1   MULTIPLE VITAMINS PO, Take by mouth daily., Disp: , Rfl:    Omega-3 Fatty Acids (FISH OIL) 1000 MG CAPS, Take by mouth daily., Disp: , Rfl:    omeprazole (PRILOSEC) 40 MG capsule, Take 1 capsule (40 mg total) by mouth in the morning and at bedtime., Disp: 60 capsule, Rfl: 5   polyethylene glycol powder (GLYCOLAX/MIRALAX) 17 GM/SCOOP powder, Take 17 g by mouth daily., Disp: 507 g, Rfl: 1   psyllium (REGULOID) 0.52 g capsule, Take 0.52 g by mouth daily., Disp: , Rfl:    traZODone (DESYREL) 150 MG tablet, TAKE 1 TABLET(150 MG) BY MOUTH AT BEDTIME, Disp: 30  tablet, Rfl: 4   vitamin E 100 UNIT capsule, Take 100 Units by mouth daily., Disp: , Rfl:   Family History  Problem Relation Age of Onset   Diabetes Mother    Hypertension Mother    Congestive Heart Failure Mother    Hypertension Father    Lung cancer Father    Fibromyalgia Sister    Lupus Sister    Cancer Sister        of blood   Leukemia Paternal Grandmother    Healthy Daughter      Social History   Tobacco Use   Smoking status: Never   Smokeless tobacco: Never  Vaping Use   Vaping Use: Never used  Substance Use Topics   Alcohol use: No    Alcohol/week: 0.0 standard drinks   Drug use: No    Allergies as of 06/29/2021 - Review Complete 06/29/2021  Allergen Reaction Noted   Augmentin [amoxicillin-pot clavulanate] Nausea Only 06/07/2016   Doxycycline Nausea Only and Nausea And Vomiting 01/27/2015    Review of Systems:    All systems reviewed and negative except where noted in  HPI.   Physical Exam:  BP (!) 147/62 (BP Location: Left Arm, Patient Position: Sitting, Cuff Size: Normal)    Pulse (!) 56    Temp 98.1 F (36.7 C) (Oral)    Ht 5\' 1"  (1.549 m)    Wt 125 lb 4 oz (56.8 kg)    BMI 23.67 kg/m  No LMP recorded. Patient is postmenopausal.  General:   Alert,  Well-developed, well-nourished, pleasant and cooperative in NAD Head:  Normocephalic and atraumatic. Eyes:  Sclera clear, no icterus.   Conjunctiva pink. Ears:  Normal auditory acuity. Nose:  No deformity, discharge, or lesions. Mouth:  No deformity or lesions,oropharynx pink & moist. Neck:  Supple; no masses or thyromegaly. Lungs:  Respirations even and unlabored.  Clear throughout to auscultation.   No wheezes, crackles, or rhonchi. No acute distress. Heart:  Regular rate and rhythm; no murmurs, clicks, rubs, or gallops. Abdomen:  Normal bowel sounds. Soft, non-tender and non-distended without masses, hepatosplenomegaly or hernias noted.  No guarding or rebound tenderness.   Rectal: Not performed Msk:   Symmetrical without gross deformities. Good, equal movement & strength bilaterally. Pulses:  Normal pulses noted. Extremities:  No clubbing or edema.  No cyanosis. Neurologic:  Alert and oriented x3;  grossly normal neurologically. Skin:  Intact without significant lesions or rashes. No jaundice. Psych:  Alert and cooperative. Normal mood and affect.  Imaging Studies: No abdominal imaging  Assessment and Plan:   Jocelyn Harris is a 71 y.o. Caucasian female with history of chronic GERD, hypertension is seen in consultation for epigastric pain, worse postprandial associated with diarrhea and abdominal bloating.  Patient had an EGD in 11/2020 which was unremarkable.  No evidence of H. pylori infection  Recommend CT pancreas protocol Recommend stool studies to rule out infection Recommend pancreatic fecal elastase levels Recommend H. pylori stool antigen Continue omeprazole 40 mg twice daily for now If above work-up is negative and symptoms are persistent, recommend EGD to evaluate for peptic ulcer disease   Follow up in 3 months or sooner if needed  Cephas Darby, MD

## 2021-06-30 LAB — BUN+CREAT
BUN/Creatinine Ratio: 26 (ref 12–28)
BUN: 22 mg/dL (ref 8–27)
Creatinine, Ser: 0.86 mg/dL (ref 0.57–1.00)
eGFR: 74 mL/min/{1.73_m2} (ref >59)

## 2021-07-08 LAB — H. PYLORI ANTIGEN, STOOL: H pylori Ag, Stl: NEGATIVE

## 2021-07-10 ENCOUNTER — Encounter: Payer: Self-pay | Admitting: Gastroenterology

## 2021-07-10 ENCOUNTER — Other Ambulatory Visit: Payer: Self-pay | Admitting: Gastroenterology

## 2021-07-10 DIAGNOSIS — A040 Enteropathogenic Escherichia coli infection: Secondary | ICD-10-CM

## 2021-07-10 LAB — GI PROFILE, STOOL, PCR

## 2021-07-10 LAB — PANCREATIC ELASTASE, FECAL: Pancreatic Elastase, Fecal: 448 ug Elast./g (ref >200)

## 2021-07-10 MED ORDER — AZITHROMYCIN 500 MG PO TABS: 500.0000 mg | ORAL_TABLET | Freq: Every day | ORAL | 0 refills | Status: AC

## 2021-07-13 ENCOUNTER — Ambulatory Visit
Admission: RE | Admit: 2021-07-13 | Discharge: 2021-07-13 | Disposition: A | Discharge status: Home / Self Care | Payer: No Typology Code available for payment source | Source: Ambulatory Visit | Attending: Gastroenterology | Admitting: Gastroenterology

## 2021-07-13 ENCOUNTER — Other Ambulatory Visit: Payer: Self-pay

## 2021-07-13 DIAGNOSIS — R1013 Epigastric pain: Secondary | ICD-10-CM | POA: Diagnosis present

## 2021-07-13 MED ORDER — IOHEXOL 300 MG/ML  SOLN
100.0000 mL | Freq: Once | INTRAMUSCULAR | Status: AC | PRN
  Administered 2021-07-13: 100 mL via INTRAVENOUS

## 2021-07-20 ENCOUNTER — Other Ambulatory Visit: Payer: Self-pay | Admitting: *Deleted

## 2021-07-20 DIAGNOSIS — D509 Iron deficiency anemia, unspecified: Secondary | ICD-10-CM

## 2021-07-22 ENCOUNTER — Other Ambulatory Visit: Payer: Self-pay | Admitting: Family Medicine

## 2021-07-22 DIAGNOSIS — I10 Essential (primary) hypertension: Secondary | ICD-10-CM

## 2021-07-24 NOTE — Telephone Encounter (Signed)
Requested medication (s) are due for refill today:   No but pt requesting early prevent missed doses  Requested medication (s) are on the active medication list:   Yes  Future visit scheduled:   Yes in 1 mo.   Last ordered: 04/19/2021 #90, 1 refill  Returned because protocol criteria not met; Potassium not within 180 days and BP out of range.     Requested Prescriptions  Pending Prescriptions Disp Refills   lisinopril (ZESTRIL) 40 MG tablet [Pharmacy Med Name: LISINOPRIL 40MG TABLETS] 90 tablet 1    Sig: TAKE 1 TABLET(40 MG) BY MOUTH DAILY     Cardiovascular:  ACE Inhibitors Failed - 07/22/2021  8:04 AM      Failed - K in normal range and within 180 days    Potassium  Date Value Ref Range Status  12/22/2020 4.2 3.5 - 5.2 mmol/L Final          Failed - Last BP in normal range    BP Readings from Last 1 Encounters:  06/29/21 (!) 147/62          Passed - Cr in normal range and within 180 days    Creatinine, Ser  Date Value Ref Range Status  06/29/2021 0.86 0.57 - 1.00 mg/dL Final          Passed - Patient is not pregnant      Passed - Valid encounter within last 6 months    Recent Outpatient Visits           1 month ago Viral upper respiratory tract infection   [facility] Mikey Kirschner, PA-C   2 months ago Primary hypertension   [facility] Jerrol Banana., MD   5 months ago Primary hypertension   [facility] Jerrol Banana., MD   7 months ago Anemia, unspecified type   [facility] Jerrol Banana., MD   8 months ago Seasonal allergic rhinitis due to other allergic trigger   [place], Vickki Muff, PA-C       Future Appointments             In 1 month Jerrol Banana., MD [facility], [place]   In 2 months V[place], Tally Due, MD [place]

## 2021-07-25 ENCOUNTER — Telehealth: Payer: Self-pay

## 2021-07-25 DIAGNOSIS — K529 Noninfective gastroenteritis and colitis, unspecified: Secondary | ICD-10-CM

## 2021-07-25 NOTE — Telephone Encounter (Signed)
Patient verbalized understanding and states she packs her lunch from home

## 2021-07-25 NOTE — Addendum Note (Signed)
Addended by: Ulyess Blossom L on: 07/25/2021 03:41 PM   Modules accepted: Orders

## 2021-07-25 NOTE — Telephone Encounter (Signed)
Patient left voicemail stating she is having abdominal pain

## 2021-07-25 NOTE — Telephone Encounter (Signed)
Patient states she has been having mid abdominal pain. States Sunday she began to have abdominal cramping. She states she has been having diarrhea 4 times and the 4th time she could not make it to the bathroom. States certain things she eats makes her have diarrhea. She states on Sunday she had chicken and dumplings and then had to go to the bathroom 4 times. She states she has not had blood in stool. She states her mom had crohn's disease and when she was little. Had IBS

## 2021-07-25 NOTE — Telephone Encounter (Signed)
Is she eating out regularly?  She recently had E. coli and treated with azithromycin about 2 weeks ago.  Recommend repeat stool studies to rule out infection  RV

## 2021-07-26 ENCOUNTER — Inpatient Hospital Stay: Payer: No Typology Code available for payment source | Attending: Nurse Practitioner

## 2021-07-26 ENCOUNTER — Other Ambulatory Visit: Payer: Medicare HMO

## 2021-07-26 ENCOUNTER — Other Ambulatory Visit: Payer: Self-pay

## 2021-07-26 DIAGNOSIS — D509 Iron deficiency anemia, unspecified: Secondary | ICD-10-CM | POA: Diagnosis not present

## 2021-07-26 LAB — CBC WITH DIFFERENTIAL/PLATELET
Abs Immature Granulocytes: 0.01 10*3/uL (ref 0.00–0.07)
Basophils Absolute: 0.1 10*3/uL (ref 0.0–0.1)
Basophils Relative: 1 %
Eosinophils Absolute: 0.1 10*3/uL (ref 0.0–0.5)
Eosinophils Relative: 1 %
HCT: 37.5 % (ref 36.0–46.0)
Hemoglobin: 12.1 g/dL (ref 12.0–15.0)
Immature Granulocytes: 0 %
Lymphocytes Relative: 39 %
Lymphs Abs: 2.8 10*3/uL (ref 0.7–4.0)
MCH: 29.1 pg (ref 26.0–34.0)
MCHC: 32.3 g/dL (ref 30.0–36.0)
MCV: 90.1 fL (ref 80.0–100.0)
Monocytes Absolute: 0.5 10*3/uL (ref 0.1–1.0)
Monocytes Relative: 6 %
Neutro Abs: 3.8 10*3/uL (ref 1.7–7.7)
Neutrophils Relative %: 53 %
Platelets: 230 10*3/uL (ref 150–400)
RBC: 4.16 MIL/uL (ref 3.87–5.11)
RDW: 13.2 % (ref 11.5–15.5)
WBC: 7.2 10*3/uL (ref 4.0–10.5)
nRBC: 0 % (ref 0.0–0.2)

## 2021-07-26 LAB — IRON AND TIBC
Iron: 42 ug/dL (ref 28–170)
Saturation Ratios: 13 % (ref 10.4–31.8)
TIBC: 315 ug/dL (ref 250–450)
UIBC: 273 ug/dL

## 2021-07-26 LAB — FERRITIN: Ferritin: 39 ng/mL (ref 11–307)

## 2021-07-27 ENCOUNTER — Encounter: Payer: Self-pay | Admitting: Oncology

## 2021-07-27 ENCOUNTER — Inpatient Hospital Stay (HOSPITAL_BASED_OUTPATIENT_CLINIC_OR_DEPARTMENT_OTHER): Payer: No Typology Code available for payment source | Admitting: Oncology

## 2021-07-27 ENCOUNTER — Inpatient Hospital Stay: Payer: No Typology Code available for payment source

## 2021-07-27 VITALS — BP 165/71 | HR 53 | Temp 98.0°F | Resp 16

## 2021-07-27 VITALS — BP 176/69 | HR 59 | Temp 98.7°F | Resp 16 | Wt 125.0 lb

## 2021-07-27 DIAGNOSIS — D509 Iron deficiency anemia, unspecified: Secondary | ICD-10-CM

## 2021-07-27 DIAGNOSIS — R197 Diarrhea, unspecified: Secondary | ICD-10-CM

## 2021-07-27 MED ORDER — SODIUM CHLORIDE 0.9 % IV SOLN: 200.0000 mg | Freq: Once | INTRAVENOUS | Status: DC

## 2021-07-27 MED ORDER — IRON SUCROSE 20 MG/ML IV SOLN
200.0000 mg | Freq: Once | INTRAVENOUS | Status: AC
  Administered 2021-07-27: 200 mg via INTRAVENOUS
  Filled 2021-07-27: qty 10

## 2021-07-27 MED ORDER — SODIUM CHLORIDE 0.9 % IV SOLN
Freq: Once | INTRAVENOUS | Status: AC
  Filled 2021-07-27: qty 250

## 2021-07-27 NOTE — Patient Instructions (Signed)

## 2021-07-27 NOTE — Progress Notes (Signed)
Pt in for follow up, reports has been having some stomach issues and pain.  Currently taking carafate. Pt states she has been more tired and fatigued.

## 2021-07-27 NOTE — Progress Notes (Signed)
[place]  Telephone:([phone] [phone]) [phone]  ID: Jocelyn Harris OB: Oct 16, 1950  MR#: [id number]  IWL#:[id number]  Patient Care Team: Jerrol Banana., MD as PCP - General (Family Medicine) Rubie Maid, MD as Referring Physician (Obstetrics and Gynecology)  CHIEF COMPLAINT: Iron deficiency anemia.  INTERVAL HISTORY: Jocelyn Harris is a 71 year old female with past medical history significant for hypertension, acid reflux, anxiety, hyperlipidemia, depression and iron deficiency anemia who is followed by Dr. Grayland Ormond.  Received 3 doses of IV Venofer in August 2022.  In the interim she has been evaluated by gastroenterology for diarrhea and epigastric pain.  She recently had a EGD and colonoscopy on 12/01/2020 which showed several polyps that were biopsied, nonbleeding internal hemorrhoids and diverticulosis.  Biopsies were negative for dysplasia or malignancy.  Stool studies were negative for H. pylori.  CT pancreas showed a 7 mm lesion in the right lobe of liver too small to characterize and likely benign.  Stool studies did show E. coli concerning for gastroenteritis.  She was placed on an antibiotic for 3 days.  Symptoms somewhat resolved.  Today she reports intermittent diarrhea and abdominal pain that is worse with certain foods.  States she is supposed to return a stool sample to see if the E. coli infection has resolved. Reports her last episodes of diarrhea were on Sunday.  She has taken Imodium.  Reports some fatigue but denies any bleeding.  States she did have several dark stools over the past few weeks but thought it was something she ate.  Denies any bleeding hemorrhoids.  REVIEW OF SYSTEMS:   Review of Systems  Constitutional:  Positive for malaise/fatigue. Negative for chills, fever and weight loss.  HENT:  Negative for congestion, ear pain and tinnitus.   Eyes: Negative.  Negative for blurred vision and double vision.  Respiratory: Negative.   Negative for cough, sputum production and shortness of breath.   Cardiovascular: Negative.  Negative for chest pain, palpitations and leg swelling.  Gastrointestinal:  Positive for abdominal pain and diarrhea. Negative for constipation, nausea and vomiting.  Genitourinary:  Negative for dysuria, frequency and urgency.  Musculoskeletal:  Negative for back pain and falls.  Skin: Negative.  Negative for rash.  Neurological: Negative.  Negative for weakness and headaches.  Endo/Heme/Allergies: Negative.  Does not bruise/bleed easily.  Psychiatric/Behavioral: Negative.  Negative for depression. The patient is not nervous/anxious and does not have insomnia.    As per HPI. Otherwise, a complete review of systems is negative.  PAST MEDICAL HISTORY: Past Medical History:  Diagnosis Date   Anemia    GERD (gastroesophageal reflux disease)    Hypertension    Iron deficiency anemia 01/17/2021   Positive fecal occult blood test    TMJ (dislocation of temporomandibular joint)     PAST SURGICAL HISTORY: Past Surgical History:  Procedure Laterality Date   COLONOSCOPY WITH PROPOFOL N/A 12/01/2020   Procedure: COLONOSCOPY WITH PROPOFOL;  Surgeon: Virgel Manifold, MD;  Location: [facility] END[facility]COPY;  Service: Endoscopy;  Laterality: N/A;   ESOPHAGOGASTRODUODENOSCOPY (EGD) WITH PROPOFOL N/A 12/01/2020   Procedure: ESOPHAGOGASTRODUODENOSCOPY (EGD) WITH PROPOFOL;  Surgeon: Virgel Manifold, MD;  Location: [facility] END[facility]COPY;  Service: Endoscopy;  Laterality: N/A;   GIVENS CAPSULE STUDY N/A 03/07/2021   Procedure: GIVENS CAPSULE STUDY;  Surgeon: Virgel Manifold, MD;  Location: [facility];  Service: Endoscopy;  Laterality: N/A;   KNEE SURGERY Right    TOE SURGERY     TUBAL LIGATION  FAMILY HISTORY: Family History  Problem Relation Age of Onset   Diabetes Mother    Hypertension Mother    Congestive Heart Failure Mother    Hypertension Father    Lung cancer Father    Fibromyalgia Sister     Lupus Sister    Cancer Sister        of blood   Leukemia Paternal Grandmother    Healthy Daughter     ADVANCED DIRECTIVES (Y/N):  N  HEALTH MAINTENANCE: Social History   Tobacco Use   Smoking status: Never   Smokeless tobacco: Never  Vaping Use   Vaping Use: Never used  Substance Use Topics   Alcohol use: No    Alcohol/week: 0.0 standard drinks   Drug use: No     Colonoscopy:  PAP:  Bone density:  Lipid panel:  Allergies  Allergen Reactions   Augmentin [Amoxicillin-Pot Clavulanate] Nausea Only    Patient reports that her reflux was bad when she took medication   Doxycycline Nausea Only and Nausea And Vomiting    Current Outpatient Medications  Medication Sig Dispense Refill   azelastine (ASTELIN) 0.1 % nasal spray Place 2 sprays into both nostrils 2 (two) times daily. Use in each nostril as directed 30 mL 3   Biotin 1 MG CAPS Take by mouth.     busPIRone (BUSPAR) 10 MG tablet TAKE 1 TABLET(10 MG) BY MOUTH THREE TIMES DAILY 90 tablet 4   calcium gluconate 500 MG tablet Take 1 tablet by mouth daily.      cetirizine (ZYRTEC) 10 MG tablet Take 1 tablet (10 mg total) by mouth daily. 90 tablet 1   Cholecalciferol 1000 UNITS capsule Take 1,000 Units by mouth daily.     cyclobenzaprine (FLEXERIL) 10 MG tablet Take 1 tablet (10 mg total) by mouth at bedtime as needed for muscle spasms. 30 tablet 1   fluticasone (FLONASE) 50 MCG/ACT nasal spray SHAKE LIQUID AND USE 2 SPRAYS IN EACH NOSTRIL DAILY 16 g 2   gabapentin (NEURONTIN) 100 MG capsule TAKE 1 CAPSULE(100 MG) BY MOUTH THREE TIMES DAILY 90 capsule 2   hydrochlorothiazide (HYDRODIURIL) 25 MG tablet Take 1 tablet (25 mg total) by mouth daily. 30 tablet 12   lisinopril (ZESTRIL) 40 MG tablet TAKE 1 TABLET(40 MG) BY MOUTH DAILY 90 tablet 1   Lysine 500 MG TABS Take by mouth as needed.      montelukast (SINGULAIR) 10 MG tablet TAKE 1 TABLET BY MOUTH EVERY NIGHT AT BEDTIME 90 tablet 1   MULTIPLE VITAMINS PO Take by mouth daily.      Omega-3 Fatty Acids (FISH OIL) 1000 MG CAPS Take by mouth daily.     omeprazole (PRILOSEC) 40 MG capsule Take 1 capsule (40 mg total) by mouth in the morning and at bedtime. 60 capsule 5   polyethylene glycol powder (GLYCOLAX/MIRALAX) 17 GM/SCOOP powder Take 17 g by mouth daily. 507 g 1   psyllium (REGULOID) 0.52 g capsule Take 0.52 g by mouth daily.     traZODone (DESYREL) 150 MG tablet TAKE 1 TABLET(150 MG) BY MOUTH AT BEDTIME 30 tablet 4   vitamin E 100 UNIT capsule Take 100 Units by mouth daily.     No current facility-administered medications for this visit.    OBJECTIVE: There were no vitals filed for this visit.    There is no height or weight on file to calculate BMI.    ECOG FS:0 - Asymptomatic  Physical Exam Constitutional:      Appearance: Normal  appearance.  HENT:     Head: Normocephalic and atraumatic.  Eyes:     Pupils: Pupils are equal, round, and reactive to light.  Cardiovascular:     Rate and Rhythm: Normal rate and regular rhythm.     Heart sounds: Normal heart sounds. No murmur heard. Pulmonary:     Effort: Pulmonary effort is normal.     Breath sounds: Normal breath sounds. No wheezing.  Abdominal:     General: Bowel sounds are normal. There is no distension.     Palpations: Abdomen is soft.     Tenderness: There is no abdominal tenderness.  Musculoskeletal:        General: Normal range of motion.     Cervical back: Normal range of motion.  Skin:    General: Skin is warm and dry.     Findings: No rash.  Neurological:     Mental Status: She is alert and oriented to person, place, and time.  Psychiatric:        Judgment: Judgment normal.    LAB RESULTS:  Lab Results  Component Value Date   NA 141 12/22/2020   K 4.2 12/22/2020   CL 102 12/22/2020   CO2 26 12/22/2020   GLUCOSE 83 12/22/2020   BUN 22 06/29/2021   CREATININE 0.86 06/29/2021   CALCIUM 9.8 12/22/2020   PROT 6.6 12/22/2020   ALBUMIN 4.6 12/22/2020   AST 11 12/22/2020   ALT 10  12/22/2020   ALKPHOS 72 12/22/2020   BILITOT 0.2 12/22/2020   GFRNONAA 80 01/28/2020   GFRAA 92 01/28/2020    Lab Results  Component Value Date   WBC 7.2 07/26/2021   NEUTROABS 3.8 07/26/2021   HGB 12.1 07/26/2021   HCT 37.5 07/26/2021   MCV 90.1 07/26/2021   PLT 230 07/26/2021   Lab Results  Component Value Date   IRON 42 07/26/2021   TIBC 315 07/26/2021   IRONPCTSAT 13 07/26/2021   Lab Results  Component Value Date   FERRITIN 39 07/26/2021     STUDIES: CT PANCREAS ABD W/WO  Result Date: 07/15/2021 CLINICAL DATA:  Epigastric pain, occasional diarrhea and nausea. EXAM: CT ABDOMEN WITHOUT AND WITH CONTRAST TECHNIQUE: Multidetector CT imaging of the abdomen was performed following the standard protocol before and following the bolus administration of intravenous contrast. RADIATION DOSE REDUCTION: This exam was performed according to the departmental dose-optimization program which includes automated exposure control, adjustment of the mA and/or kV according to patient size and/or use of iterative reconstruction technique. CONTRAST:  155mL OMNIPAQUE IOHEXOL 300 MG/ML  SOLN COMPARISON:  None. FINDINGS: Lower chest: Hypoventilatory change in lung bases. Hepatobiliary: Subcentimeter hypodense lesion in the right lobe of the liver on image 15/11 measures 7 mm to small to accurately characterize but statistically likely to reflect a benign etiology such as a hemangioma or cyst. Gallbladder is unremarkable. No biliary ductal dilation. Pancreas: No pancreatic ductal dilation or evidence of acute inflammation. Spleen: Normal in size without focal abnormality. Adrenals/Urinary Tract: Bilateral adrenal gland appears normal. No hydronephrosis. Bilateral extrarenal pelvis. No nephrolithiasis. The kidneys demonstrate symmetric enhancement and excretion of contrast material. No suspicious filling defect visualized within the opacified portions of the collecting systems or proximal ureters on delayed  imaging. No solid enhancing renal mass. Stomach/Bowel: Stomach is unremarkable for degree of distension. No pathologic dilation or evidence of acute inflammation involving loops of large or small bowel in the abdomen. Vascular/Lymphatic: No abdominal aortic aneurysm. No pathologically enlarged abdominal lymph nodes. Other: No significant  abdominopelvic free fluid. Musculoskeletal: Spondylosis.  No acute osseous abnormality. IMPRESSION: 1. No CT findings to explain the patient's epigastric pain. 2. Hypodense 7 mm lesion in the right lobe of the liver, to small to accurately characterize but statistically likely to reflect a benign etiology such as a hemangioma or cyst. Electronically Signed   By: Dahlia Bailiff M.D.   On: 07/15/2021 09:06    ASSESSMENT: Iron deficiency anemia.    PLAN:    Clinically patient is feeling fair.  Labs from today show a hemoglobin of 12.1 and MCV of 90.1.  Ferritin is 39 and iron saturation is 13%.  She last received IV iron back in August 2023 for ferritin of 11.  Labs appear to be trending down and she is symptomatic with fatigue and weakness.  She is unable to tolerate oral iron secondary to constipation.  Would recommend 1 dose of IV iron today.  Return to clinic in 3 months for follow-up with lab work and possible IV iron.  Abdominal pain/diarrhea-continue follow up with GI.  Disposition-iron today and see Dr. Grayland Ormond back in 3 months with lab work and possible IV iron.  I spent 25 minutes dedicated to the care of this patient (face-to-face and non-face-to-face) on the date of the encounter to include what is described in the assessment and plan.  Patient expressed understanding and was in agreement with this plan. She also understands that She can call clinic at any time with any questions, concerns, or complaints.   Jacquelin Hawking, NP   07/27/2021 2:47 PM

## 2021-07-28 ENCOUNTER — Ambulatory Visit: Payer: Medicare HMO

## 2021-07-28 ENCOUNTER — Ambulatory Visit: Payer: Medicare HMO | Admitting: Nurse Practitioner

## 2021-08-03 LAB — GI PROFILE, STOOL, PCR

## 2021-08-11 ENCOUNTER — Ambulatory Visit: Payer: Self-pay | Admitting: *Deleted

## 2021-08-11 NOTE — Telephone Encounter (Signed)
Summary: elevated BP   Pt called saying her blood pressure has been a little high lately.. today 144/86 Tuesday 151/80.  She has an appt on 3/9 .  Please advise.      Reason for Disposition  [8] Systolic BP  >= 329 OR Diastolic >= 80 AND [1] taking BP medications  Answer Assessment - Initial Assessment Questions 1. BLOOD PRESSURE: "What is the blood pressure?" "Did you take at least two measurements 5 minutes apart?"     yes 2. ONSET: "When did you take your blood pressure?"     At home and at office 3. HOW: "How did you obtain the blood pressure?" (e.g., visiting nurse, automatic home BP monitor)     Had at home with cuff 4. HISTORY: "Do you have a history of high blood pressure?"     On Lisinopril 40mg  for years 5. MEDICATIONS: "Are you taking any medications for blood pressure?" "Have you missed any doses recently?"     Takes regularly 6. OTHER SYMPTOMS: "Do you have any symptoms?" (e.g., headache, chest pain, blurred vision, difficulty breathing, weakness)     Headaches, congestion 7. PREGNANCY: "Is there any chance you are pregnant?" "When was your last menstrual period?"     na  Protocols used: Blood Pressure - High-A-AH

## 2021-08-11 NOTE — Telephone Encounter (Signed)
Summary: elevated BP    Pt called saying her blood pressure has been a little high lately.. today 144/86 Tuesday 151/80.  She has an appt on 3/9 .  Please advise.       Chief Complaint: htn Symptoms: headache Frequency: off and on for weeks Pertinent Negatives: Patient denies numbness tingling on one side Disposition: [] ED /[] Urgent Care (no appt availability in office) / [x] Appointment(In office/virtual)/ []  [facility]/ [] Home Care/ [] Refused Recommended Disposition /[]  Mobile Bus/ []  Follow-up with PCP Additional Notes: Pt already has visit scheduled with Dr. Rosanna Randy for less than 2 weeks, could not find earlier appt and she specically wants to see him.  Reason for Disposition  [4] Systolic BP  >= 268 OR Diastolic >= 80 AND [3] taking BP medications  Answer Assessment - Initial Assessment Questions 1. BLOOD PRESSURE: "What is the blood pressure?" "Did you take at least two measurements 5 minutes apart?"     yes 2. ONSET: "When did you take your blood pressure?"     At home and at office 3. HOW: "How did you obtain the blood pressure?" (e.g., visiting nurse, automatic home BP monitor)     Had at home with cuff 4. HISTORY: "Do you have a history of high blood pressure?"     On Lisinopril 40mg  for years 5. MEDICATIONS: "Are you taking any medications for blood pressure?" "Have you missed any doses recently?"     Takes regularly 6. OTHER SYMPTOMS: "Do you have any symptoms?" (e.g., headache, chest pain, blurred vision, difficulty breathing, weakness)     Headaches, congestion 7. PREGNANCY: "Is there any chance you are pregnant?" "When was your last menstrual period?"     na  Protocols used: Blood Pressure - High-A-AH

## 2021-08-20 ENCOUNTER — Other Ambulatory Visit: Payer: Self-pay | Admitting: Family Medicine

## 2021-08-22 ENCOUNTER — Other Ambulatory Visit: Payer: Self-pay | Admitting: Family Medicine

## 2021-08-22 DIAGNOSIS — K219 Gastro-esophageal reflux disease without esophagitis: Secondary | ICD-10-CM

## 2021-08-24 ENCOUNTER — Encounter: Payer: Self-pay | Admitting: Oncology

## 2021-08-24 ENCOUNTER — Other Ambulatory Visit: Payer: Self-pay

## 2021-08-24 ENCOUNTER — Ambulatory Visit (INDEPENDENT_AMBULATORY_CARE_PROVIDER_SITE_OTHER): Payer: No Typology Code available for payment source | Admitting: Family Medicine

## 2021-08-24 ENCOUNTER — Encounter: Payer: Self-pay | Admitting: Family Medicine

## 2021-08-24 VITALS — BP 127/69 | HR 64 | Temp 98.7°F | Resp 16 | Wt 121.0 lb

## 2021-08-24 DIAGNOSIS — I10 Essential (primary) hypertension: Secondary | ICD-10-CM | POA: Diagnosis not present

## 2021-08-24 DIAGNOSIS — K219 Gastro-esophageal reflux disease without esophagitis: Secondary | ICD-10-CM

## 2021-08-24 DIAGNOSIS — M545 Low back pain, unspecified: Secondary | ICD-10-CM

## 2021-08-24 DIAGNOSIS — K580 Irritable bowel syndrome with diarrhea: Secondary | ICD-10-CM

## 2021-08-24 DIAGNOSIS — G8929 Other chronic pain: Secondary | ICD-10-CM

## 2021-08-24 DIAGNOSIS — F419 Anxiety disorder, unspecified: Secondary | ICD-10-CM | POA: Diagnosis not present

## 2021-08-24 MED ORDER — TRIAMTERENE-HCTZ 37.5-25 MG PO CAPS: 1.0000 | ORAL_CAPSULE | Freq: Every day | ORAL | 11 refills | Status: DC

## 2021-08-24 NOTE — Progress Notes (Unsigned)
Established patient visit  I,Jocelyn Harris,acting as a scribe for Jocelyn Durie, MD.,have documented all relevant documentation on the behalf of Jocelyn Durie, MD,as directed by  Jocelyn Durie, MD while in the presence of Jocelyn Durie, MD.   Patient: Jocelyn Harris   DOB: 10-Aug-1950   71 y.o. Female  MRN: [id number] Visit Date: 08/24/2021  Today's healthcare provider: Wilhemena Durie, MD   Chief Complaint  Patient presents with   Follow-up   Hypertension   Subjective    HPI   Hypertension, follow-up  BP Readings from Last 3 Encounters:  08/24/21 127/69  07/27/21 (!) 165/71  07/27/21 (!) 176/69   Wt Readings from Last 3 Encounters:  08/24/21 121 lb (54.9 kg)  07/27/21 125 lb (56.7 kg)  06/29/21 125 lb 4 oz (56.8 kg)     She was last seen for hypertension 4 months ago.  BP at that visit was 125/77.  Management since that visit includes; Controlled on HCTZ and lisinopril 40.  She reports good compliance with treatment. She is not having side effects. none She is following a Regular diet. She is exercising. She does not smoke.  Use of agents associated with hypertension: none.   Outside blood pressures are up and down at home.  Pertinent labs: Lab Results  Component Value Date   CHOL 235 (H) 01/28/2020   HDL 58 01/28/2020   LDLCALC 148 (H) 01/28/2020   TRIG 161 (H) 01/28/2020   CHOLHDL 4.1 01/28/2020   Lab Results  Component Value Date   NA 141 12/22/2020   K 4.2 12/22/2020   CREATININE 0.86 06/29/2021   EGFR 74 06/29/2021   GLUCOSE 83 12/22/2020   TSH 1.790 12/22/2020     The 10-year ASCVD risk score (Arnett DK, et al., 2019) is: 10.5%   --------------------------------------------------------------------------------------------------- Depression, Follow-up  She  was last seen for this 4 months ago. Changes made at last visit include; Clinically stable since the death of her fianc.  She is on BuSpar and  trazodone.   She reports good compliance with treatment. She is not having side effects. none  She reports good tolerance of treatment. Current symptoms include:  n/a She feels she is Improved since last visit.  Depression screen [facility] 2/9 07/01/2020 03/08/2020 11/26/2019  Decreased Interest 0 0 0  Down, Depressed, Hopeless 0 0 0  PHQ - 2 Score 0 0 0  Altered sleeping 0 1 -  Tired, decreased energy 0 1 -  Change in appetite 0 0 -  Feeling bad or failure about yourself  0 0 -  Trouble concentrating 0 0 -  Moving slowly or fidgety/restless 0 0 -  Suicidal thoughts 0 0 -  PHQ-9 Score 0 2 -  Difficult doing work/chores Not difficult at all Not difficult at all -    ----------------------------------------------------------------------------------------- Anxiety, Follow-up  She was last seen for anxiety 4 months ago. Changes made at last visit include; BuSpar.  Would avoid benzodiazepine.   She reports good compliance with treatment. She reports good tolerance of treatment. She is not having side effects. none  She feels her anxiety is mild and Improved since last visit.  GAD-7 Results No flowsheet data found.  PHQ-9 Scores PHQ9 SCORE ONLY 07/01/2020 03/08/2020 11/26/2019  PHQ-9 Total Score 0 2 0    ---------------------------------------------------------------------------------------------------   Medications: Outpatient Medications Prior to Visit  Medication Sig   azelastine (ASTELIN) 0.1 % nasal spray Place 2 sprays into both nostrils 2 (  two) times daily. Use in each nostril as directed   Biotin 1 MG CAPS Take by mouth.   busPIRone (BUSPAR) 10 MG tablet TAKE 1 TABLET(10 MG) BY MOUTH THREE TIMES DAILY   calcium gluconate 500 MG tablet Take 1 tablet by mouth daily.    cetirizine (ZYRTEC) 10 MG tablet Take 1 tablet (10 mg total) by mouth daily.   cyclobenzaprine (FLEXERIL) 10 MG tablet Take 1 tablet (10 mg total) by mouth at bedtime as needed for muscle spasms.    fluticasone (FLONASE) 50 MCG/ACT nasal spray SHAKE LIQUID AND USE 2 SPRAYS IN EACH NOSTRIL DAILY   gabapentin (NEURONTIN) 100 MG capsule TAKE 1 CAPSULE(100 MG) BY MOUTH THREE TIMES DAILY   lisinopril (ZESTRIL) 40 MG tablet TAKE 1 TABLET(40 MG) BY MOUTH DAILY   Lysine 500 MG TABS Take by mouth as needed.    montelukast (SINGULAIR) 10 MG tablet TAKE 1 TABLET BY MOUTH EVERY NIGHT AT BEDTIME   MULTIPLE VITAMINS PO Take by mouth daily.   Omega-3 Fatty Acids (FISH OIL) 1000 MG CAPS Take by mouth daily.   omeprazole (PRILOSEC) 40 MG capsule TAKE 1 CAPSULE(40 MG) BY MOUTH IN THE MORNING AND AT BEDTIME   polyethylene glycol powder (GLYCOLAX/MIRALAX) 17 GM/SCOOP powder Take 17 g by mouth daily.   sucralfate (CARAFATE) 1 g tablet Take 1 g by mouth 4 (four) times daily.   traZODone (DESYREL) 150 MG tablet TAKE 1 TABLET(150 MG) BY MOUTH AT BEDTIME   vitamin E 100 UNIT capsule Take 100 Units by mouth daily.   Cholecalciferol 1000 UNITS capsule Take 1,000 Units by mouth daily. (Patient not taking: Reported on 07/27/2021)   No facility-administered medications prior to visit.    Review of Systems  Constitutional:  Negative for appetite change, chills, fatigue and fever.  Respiratory:  Negative for chest tightness and shortness of breath.   Cardiovascular:  Negative for chest pain and palpitations.  Gastrointestinal:  Negative for abdominal pain, nausea and vomiting.  Neurological:  Negative for dizziness and weakness.   {Labs   Heme   Chem   Endocrine   Serology   Results Review (optional):23779}   Objective    BP 127/69 (BP Location: Left Arm, Patient Position: Sitting, Cuff Size: Normal)    Pulse 64    Temp 98.7 F (37.1 C) (Temporal)    Resp 16    Wt 121 lb (54.9 kg)    SpO2 96%    BMI 22.86 kg/m  {Show previous vital signs (optional):23777}  Physical Exam  ***  No results found for any visits on 08/24/21.  Assessment & Plan     ***  No follow-ups on file.      {provider  attestation***:1}   Jocelyn Durie, MD  [facility] [phone] (phone) [phone] (fax)  [place]

## 2021-08-24 NOTE — Patient Instructions (Addendum)
Try a dose of Metamucil every day for bowel health. ?

## 2021-09-20 ENCOUNTER — Encounter: Payer: Self-pay | Admitting: Oncology

## 2021-09-21 ENCOUNTER — Other Ambulatory Visit: Payer: Self-pay | Admitting: Family Medicine

## 2021-09-21 DIAGNOSIS — J309 Allergic rhinitis, unspecified: Secondary | ICD-10-CM

## 2021-09-27 ENCOUNTER — Ambulatory Visit: Payer: No Typology Code available for payment source | Admitting: Gastroenterology

## 2021-10-22 ENCOUNTER — Other Ambulatory Visit: Payer: Self-pay | Admitting: Family Medicine

## 2021-10-26 ENCOUNTER — Ambulatory Visit: Payer: No Typology Code available for payment source | Admitting: Oncology

## 2021-10-26 ENCOUNTER — Encounter: Payer: Self-pay | Admitting: Oncology

## 2021-10-26 ENCOUNTER — Other Ambulatory Visit: Payer: No Typology Code available for payment source

## 2021-10-26 ENCOUNTER — Inpatient Hospital Stay: Payer: Medicare HMO

## 2021-10-26 ENCOUNTER — Inpatient Hospital Stay: Payer: Medicare HMO | Attending: Oncology

## 2021-10-26 ENCOUNTER — Ambulatory Visit: Payer: No Typology Code available for payment source

## 2021-10-26 ENCOUNTER — Inpatient Hospital Stay (HOSPITAL_BASED_OUTPATIENT_CLINIC_OR_DEPARTMENT_OTHER): Payer: Medicare HMO | Admitting: Oncology

## 2021-10-26 VITALS — BP 139/69 | HR 61 | Temp 98.7°F | Resp 20 | Wt 124.3 lb

## 2021-10-26 VITALS — BP 115/58 | HR 53 | Resp 18

## 2021-10-26 DIAGNOSIS — R197 Diarrhea, unspecified: Secondary | ICD-10-CM | POA: Insufficient documentation

## 2021-10-26 DIAGNOSIS — R531 Weakness: Secondary | ICD-10-CM | POA: Insufficient documentation

## 2021-10-26 DIAGNOSIS — K5900 Constipation, unspecified: Secondary | ICD-10-CM | POA: Diagnosis not present

## 2021-10-26 DIAGNOSIS — E785 Hyperlipidemia, unspecified: Secondary | ICD-10-CM | POA: Diagnosis not present

## 2021-10-26 DIAGNOSIS — D509 Iron deficiency anemia, unspecified: Secondary | ICD-10-CM | POA: Diagnosis not present

## 2021-10-26 DIAGNOSIS — R5383 Other fatigue: Secondary | ICD-10-CM | POA: Diagnosis not present

## 2021-10-26 DIAGNOSIS — I10 Essential (primary) hypertension: Secondary | ICD-10-CM | POA: Diagnosis not present

## 2021-10-26 LAB — CBC WITH DIFFERENTIAL/PLATELET
Abs Immature Granulocytes: 0.02 10*3/uL (ref 0.00–0.07)
Basophils Absolute: 0.1 10*3/uL (ref 0.0–0.1)
Basophils Relative: 1 %
Eosinophils Absolute: 0.1 10*3/uL (ref 0.0–0.5)
Eosinophils Relative: 2 %
HCT: 37.4 % (ref 36.0–46.0)
Hemoglobin: 12.3 g/dL (ref 12.0–15.0)
Immature Granulocytes: 0 %
Lymphocytes Relative: 37 %
Lymphs Abs: 3.1 10*3/uL (ref 0.7–4.0)
MCH: 29.9 pg (ref 26.0–34.0)
MCHC: 32.9 g/dL (ref 30.0–36.0)
MCV: 90.8 fL (ref 80.0–100.0)
Monocytes Absolute: 0.5 10*3/uL (ref 0.1–1.0)
Monocytes Relative: 6 %
Neutro Abs: 4.5 10*3/uL (ref 1.7–7.7)
Neutrophils Relative %: 54 %
Platelets: 230 10*3/uL (ref 150–400)
RBC: 4.12 MIL/uL (ref 3.87–5.11)
RDW: 13.1 % (ref 11.5–15.5)
WBC: 8.4 10*3/uL (ref 4.0–10.5)
nRBC: 0 % (ref 0.0–0.2)

## 2021-10-26 LAB — IRON AND TIBC
Iron: 63 ug/dL (ref 28–170)
Saturation Ratios: 21 % (ref 10.4–31.8)
TIBC: 305 ug/dL (ref 250–450)
UIBC: 242 ug/dL

## 2021-10-26 LAB — FERRITIN: Ferritin: 67 ng/mL (ref 11–307)

## 2021-10-26 MED ORDER — SODIUM CHLORIDE 0.9 % IV SOLN
Freq: Once | INTRAVENOUS | Status: AC
  Filled 2021-10-26: qty 250

## 2021-10-26 MED ORDER — IRON SUCROSE 20 MG/ML IV SOLN
200.0000 mg | Freq: Once | INTRAVENOUS | Status: AC
  Administered 2021-10-26: 200 mg via INTRAVENOUS
  Filled 2021-10-26: qty 10

## 2021-10-26 MED ORDER — SODIUM CHLORIDE 0.9 % IV SOLN: 200.0000 mg | Freq: Once | INTRAVENOUS | Status: DC

## 2021-10-26 NOTE — Progress Notes (Signed)
?[place]  ?[phone] [phone] ? ?ID: Jocelyn Harris OB: Jan 09, 1951  MR#: [id number]  WCB#:[id number] ? ?Patient Care Team: ?Jerrol Banana., MD as PCP - General (Family Medicine) ?Rubie Maid, MD as Referring Physician (Obstetrics and Gynecology) ? ?CHIEF COMPLAINT: Iron deficiency anemia. ? ?INTERVAL HISTORY: Jocelyn Harris is a 71 year old female with past medical history significant for hypertension, acid reflux, anxiety, hyperlipidemia, depression and iron deficiency anemia who is followed by Dr. Grayland Ormond.  Received 3 doses of IV Venofer in August 2022. ? ?In the interim she has been doing well.  Has occasional diarrhea and epigastric pain but this has improved from previous.  Needs to schedule follow-up with her GI physician.  Missed an appointment recently due to a work conflict.  States after receiving IV Venofer back in February she noted some improvement in her energy level but now feels that it is starting to decline again.  Denies any bleeding. ? ?REVIEW OF SYSTEMS:   ?Review of Systems  ?Constitutional:  Positive for malaise/fatigue. Negative for chills, fever and weight loss.  ?HENT:  Negative for congestion, ear pain and tinnitus.   ?Eyes: Negative.  Negative for blurred vision and double vision.  ?Respiratory: Negative.  Negative for cough, sputum production and shortness of breath.   ?Cardiovascular: Negative.  Negative for chest pain, palpitations and leg swelling.  ?Gastrointestinal: Negative.  Negative for abdominal pain, constipation, diarrhea, nausea and vomiting.  ?Genitourinary:  Negative for dysuria, frequency and urgency.  ?Musculoskeletal:  Negative for back pain and falls.  ?Skin: Negative.  Negative for rash.  ?Neurological:  Positive for weakness. Negative for headaches.  ?Endo/Heme/Allergies: Negative.  Does not bruise/bleed easily.  ?Psychiatric/Behavioral: Negative.  Negative for depression. The patient is not nervous/anxious and  does not have insomnia.   ? ?As per HPI. Otherwise, a complete review of systems is negative. ? ?PAST MEDICAL HISTORY: ?Past Medical History:  ?Diagnosis Date  ? Anemia   ? GERD (gastroesophageal reflux disease)   ? Hypertension   ? Iron deficiency anemia 01/17/2021  ? Positive fecal occult blood test   ? TMJ (dislocation of temporomandibular joint)   ? ? ?PAST SURGICAL HISTORY: ?Past Surgical History:  ?Procedure Laterality Date  ? COLONOSCOPY WITH PROPOFOL N/A 12/01/2020  ? Procedure: COLONOSCOPY WITH PROPOFOL;  Surgeon: Virgel Manifold, MD;  Location: [facility] ENDOSCOPY;  Service: Endoscopy;  Laterality: N/A;  ? ESOPHAGOGASTRODUODENOSCOPY (EGD) WITH PROPOFOL N/A 12/01/2020  ? Procedure: ESOPHAGOGASTRODUODENOSCOPY (EGD) WITH PROPOFOL;  Surgeon: Virgel Manifold, MD;  Location: [facility] ENDOSCOPY;  Service: Endoscopy;  Laterality: N/A;  ? GIVENS CAPSULE STUDY N/A 03/07/2021  ? Procedure: GIVENS CAPSULE STUDY;  Surgeon: Virgel Manifold, MD;  Location: [facility] ENDOSCOPY;  Service: Endoscopy;  Laterality: N/A;  ? KNEE SURGERY Right   ? TOE SURGERY    ? TUBAL LIGATION    ? ? ?FAMILY HISTORY: ?Family History  ?Problem Relation Age of Onset  ? Diabetes Mother   ? Hypertension Mother   ? Congestive Heart Failure Mother   ? Hypertension Father   ? Lung cancer Father   ? Fibromyalgia Sister   ? Lupus Sister   ? Cancer Sister   ?     of blood  ? Leukemia Paternal Grandmother   ? Healthy Daughter   ? ? ?ADVANCED DIRECTIVES (Y/N):  N ? ?HEALTH MAINTENANCE: ?Social History  ? ?Tobacco Use  ? Smoking status: Never  ? Smokeless tobacco: Never  ?Vaping Use  ? Vaping Use: Never used  ?  Substance Use Topics  ? Alcohol use: No  ?  Alcohol/week: 0.0 standard drinks  ? Drug use: No  ? ? ? Colonoscopy: ? PAP: ? Bone density: ? Lipid panel: ? ?Allergies  ?Allergen Reactions  ? Augmentin [Amoxicillin-Pot Clavulanate] Nausea Only  ?  Patient reports that her reflux was bad when she took medication  ? Doxycycline Nausea Only and Nausea And  Vomiting  ? ? ?Current Outpatient Medications  ?Medication Sig Dispense Refill  ? azelastine (ASTELIN) 0.1 % nasal spray Place 2 sprays into both nostrils 2 (two) times daily. Use in each nostril as directed 30 mL 3  ? Biotin 1 MG CAPS Take by mouth.    ? busPIRone (BUSPAR) 10 MG tablet TAKE 1 TABLET(10 MG) BY MOUTH THREE TIMES DAILY 90 tablet 4  ? calcium gluconate 500 MG tablet Take 1 tablet by mouth daily.     ? cetirizine (ZYRTEC) 10 MG tablet Take 1 tablet (10 mg total) by mouth daily. 90 tablet 1  ? cyclobenzaprine (FLEXERIL) 10 MG tablet Take 1 tablet (10 mg total) by mouth at bedtime as needed for muscle spasms. 30 tablet 1  ? fluticasone (FLONASE) 50 MCG/ACT nasal spray SHAKE LIQUID AND USE 2 SPRAYS IN EACH NOSTRIL DAILY 16 g 2  ? gabapentin (NEURONTIN) 100 MG capsule TAKE 1 CAPSULE(100 MG) BY MOUTH THREE TIMES DAILY 90 capsule 2  ? lisinopril (ZESTRIL) 40 MG tablet TAKE 1 TABLET(40 MG) BY MOUTH DAILY 90 tablet 1  ? Lysine 500 MG TABS Take by mouth as needed.     ? montelukast (SINGULAIR) 10 MG tablet TAKE 1 TABLET BY MOUTH EVERY NIGHT AT BEDTIME 90 tablet 1  ? MULTIPLE VITAMINS PO Take by mouth daily.    ? Omega-3 Fatty Acids (FISH OIL) 1000 MG CAPS Take by mouth daily.    ? omeprazole (PRILOSEC) 40 MG capsule TAKE 1 CAPSULE(40 MG) BY MOUTH IN THE MORNING AND AT BEDTIME 60 capsule 5  ? polyethylene glycol powder (GLYCOLAX/MIRALAX) 17 GM/SCOOP powder Take 17 g by mouth daily. 507 g 1  ? sucralfate (CARAFATE) 1 g tablet Take 1 g by mouth 4 (four) times daily.    ? traZODone (DESYREL) 150 MG tablet TAKE 1 TABLET(150 MG) BY MOUTH AT BEDTIME 30 tablet 4  ? triamterene-hydrochlorothiazide (DYAZIDE) 37.5-25 MG capsule Take 1 each (1 capsule total) by mouth daily. Each morning 30 capsule 11  ? vitamin E 100 UNIT capsule Take 100 Units by mouth daily.    ? Cholecalciferol 1000 UNITS capsule Take 1,000 Units by mouth daily. (Patient not taking: Reported on 07/27/2021)    ? ?No current facility-administered medications  for this visit.  ? ? ?OBJECTIVE: ?Vitals:  ? 10/26/21 1434  ?BP: 139/69  ?Pulse: 61  ?Resp: 20  ?Temp: 98.7 ?F (37.1 ?C)  ?SpO2: 100%  ? ?   Body mass index is 23.49 kg/m?Marland Kitchen    ECOG FS:0 - Asymptomatic ? ?Physical Exam ?Constitutional:   ?   Appearance: Normal appearance.  ?HENT:  ?   Head: Normocephalic and atraumatic.  ?Eyes:  ?   Pupils: Pupils are equal, round, and reactive to light.  ?Cardiovascular:  ?   Rate and Rhythm: Normal rate and regular rhythm.  ?   Heart sounds: Normal heart sounds. No murmur heard. ?Pulmonary:  ?   Effort: Pulmonary effort is normal.  ?   Breath sounds: Normal breath sounds. No wheezing.  ?Abdominal:  ?   General: Bowel sounds are normal. There is no distension.  ?  Palpations: Abdomen is soft.  ?   Tenderness: There is no abdominal tenderness.  ?Musculoskeletal:     ?   General: Normal range of motion.  ?   Cervical back: Normal range of motion.  ?Skin: ?   General: Skin is warm and dry.  ?   Findings: No rash.  ?Neurological:  ?   Mental Status: She is alert and oriented to person, place, and time.  ?Psychiatric:     ?   Judgment: Judgment normal.  ?  ?LAB RESULTS: ? ?Lab Results  ?Component Value Date  ? NA 141 12/22/2020  ? K 4.2 12/22/2020  ? CL 102 12/22/2020  ? CO2 26 12/22/2020  ? GLUCOSE 83 12/22/2020  ? BUN 22 06/29/2021  ? CREATININE 0.86 06/29/2021  ? CALCIUM 9.8 12/22/2020  ? PROT 6.6 12/22/2020  ? ALBUMIN 4.6 12/22/2020  ? AST 11 12/22/2020  ? ALT 10 12/22/2020  ? ALKPHOS 72 12/22/2020  ? BILITOT 0.2 12/22/2020  ? GFRNONAA 80 01/28/2020  ? GFRAA 92 01/28/2020  ? ? ?Lab Results  ?Component Value Date  ? WBC 8.4 10/26/2021  ? NEUTROABS 4.5 10/26/2021  ? HGB 12.3 10/26/2021  ? HCT 37.4 10/26/2021  ? MCV 90.8 10/26/2021  ? PLT 230 10/26/2021  ? ?Lab Results  ?Component Value Date  ? IRON 42 07/26/2021  ? TIBC 315 07/26/2021  ? IRONPCTSAT 13 07/26/2021  ? ?Lab Results  ?Component Value Date  ? FERRITIN 39 07/26/2021  ? ? ? ?STUDIES: ?No results found. ? ?ASSESSMENT: Iron  deficiency anemia.   ? ?PLAN:   ? ?Clinically patient is feeling fair.  Labs from today show a hemoglobin of 12.3 and MCV of 90.8.  Last received IV iron on 07/27/2021.  Labs are stable.  She continues to b

## 2021-10-30 ENCOUNTER — Other Ambulatory Visit: Payer: Self-pay | Admitting: Family Medicine

## 2021-10-30 DIAGNOSIS — J3089 Other allergic rhinitis: Secondary | ICD-10-CM

## 2021-11-01 NOTE — Telephone Encounter (Signed)
Requested Prescriptions  ?Pending Prescriptions Disp Refills  ?? cetirizine (ZYRTEC) 10 MG tablet [Pharmacy Med Name: CETIRIZINE '10MG'$  TABLETS] 90 tablet 1  ?  Sig: TAKE 1 TABLET(10 MG) BY MOUTH DAILY  ?  ? Ear, Nose, and Throat:  Antihistamines 2 Passed - 10/30/2021  7:43 PM  ?  ?  Passed - Cr in normal range and within 360 days  ?  Creatinine, Ser  ?Date Value Ref Range Status  ?06/29/2021 0.86 0.57 - 1.00 mg/dL Final  ?   ?  ?  Passed - Valid encounter within last 12 months  ?  Recent Outpatient Visits   ?      ? 2 months ago Primary hypertension  ? [facility] Jerrol Banana., MD  ? 4 months ago Viral upper respiratory tract infection  ? [facility], Ria Comment, PA-C  ? 6 months ago Primary hypertension  ? [facility] Jerrol Banana., MD  ? 8 months ago Primary hypertension  ? [facility] Jerrol Banana., MD  ? 10 months ago Anemia, unspecified type  ? [facility] Jerrol Banana., MD  ?  ?  ?Future Appointments   ?        ? In 2 weeks Jerrol Banana., MD [facility]  ?  ? ?  ?  ?  ? ? ?

## 2021-11-02 ENCOUNTER — Ambulatory Visit: Payer: No Typology Code available for payment source | Admitting: Family Medicine

## 2021-11-02 ENCOUNTER — Telehealth: Payer: Self-pay | Admitting: Family Medicine

## 2021-11-02 NOTE — Telephone Encounter (Signed)
Pt calling back to schedule AWV. Please advise.

## 2021-11-02 NOTE — Telephone Encounter (Signed)
Copied from [place]. Topic: Medicare AWV >> Nov 02, 2021 10:26 AM Cher Nakai R wrote: Reason for CRM:  Left message for patient to call back and schedule Medicare Annual Wellness Visit (AWV) in office.   If unable to come into the office for AWV,  please offer to do virtually or by telephone.  Last AWV: 11/26/2019  Please schedule at anytime with [facility] Health Advisor.  30 minute appointment for Virtual or phone 45 minute appointment for in office or Initial virtual/phone  Any questions, please contact me at [phone]

## 2021-11-08 ENCOUNTER — Ambulatory Visit: Payer: No Typology Code available for payment source | Admitting: Family Medicine

## 2021-11-17 ENCOUNTER — Other Ambulatory Visit: Payer: Self-pay | Admitting: Family Medicine

## 2021-11-17 DIAGNOSIS — F32A Depression, unspecified: Secondary | ICD-10-CM

## 2021-11-21 ENCOUNTER — Ambulatory Visit (INDEPENDENT_AMBULATORY_CARE_PROVIDER_SITE_OTHER): Payer: Medicare HMO | Admitting: Family Medicine

## 2021-11-21 ENCOUNTER — Encounter: Payer: Self-pay | Admitting: Family Medicine

## 2021-11-21 VITALS — BP 134/70 | HR 71 | Resp 16 | Wt 125.0 lb

## 2021-11-21 DIAGNOSIS — F419 Anxiety disorder, unspecified: Secondary | ICD-10-CM | POA: Diagnosis not present

## 2021-11-21 DIAGNOSIS — F3341 Major depressive disorder, recurrent, in partial remission: Secondary | ICD-10-CM | POA: Diagnosis not present

## 2021-11-21 DIAGNOSIS — G8929 Other chronic pain: Secondary | ICD-10-CM

## 2021-11-21 DIAGNOSIS — J3089 Other allergic rhinitis: Secondary | ICD-10-CM

## 2021-11-21 DIAGNOSIS — M545 Low back pain, unspecified: Secondary | ICD-10-CM

## 2021-11-21 DIAGNOSIS — I10 Essential (primary) hypertension: Secondary | ICD-10-CM

## 2021-11-21 NOTE — Progress Notes (Signed)
Established patient visit  I,[name],acting as a scribe for Jocelyn Durie, MD.,have documented all relevant documentation on the behalf of Jocelyn Durie, MD,as directed by  Jocelyn Durie, MD while in the presence of Jocelyn Durie, MD.   Patient: Jocelyn Harris   DOB: Oct 13, 1950   71 y.o. Female  MRN: [id number] Visit Date: 11/21/2021  Today's healthcare provider: Wilhemena Durie, MD   Chief Complaint  Patient presents with  . Follow-up  . Hypertension   Subjective    HPI  Today for routine follow-up.  He is taking medications and all chronic problems are stable She does complain of chronic low back pain that radiates to both hips and is in the LS spine region bilaterally.  She states this is starting to bother her on a daily basis.  Claudication no leg weakness  Hypertension, follow-up  BP Readings from Last 3 Encounters:  11/21/21 134/70  10/26/21 (!) 115/58  10/26/21 139/69   Wt Readings from Last 3 Encounters:  11/21/21 125 lb (56.7 kg)  10/26/21 124 lb 4.8 oz (56.4 kg)  08/24/21 121 lb (54.9 kg)     She was last seen for hypertension 3 months ago.  Management since that visit includes; Make sure patient is taking regular medications and add Dyazide daily.   Outside blood pressures are not checking.  Pertinent labs Lab Results  Component Value Date   CHOL 235 (H) 01/28/2020   HDL 58 01/28/2020   LDLCALC 148 (H) 01/28/2020   TRIG 161 (H) 01/28/2020   CHOLHDL 4.1 01/28/2020   Lab Results  Component Value Date   NA 141 12/22/2020   K 4.2 12/22/2020   CREATININE 0.86 06/29/2021   EGFR 74 06/29/2021   GLUCOSE 83 12/22/2020   TSH 1.790 12/22/2020     The 10-year ASCVD risk score (Arnett DK, et al., 2019) is: 11.7%  ---------------------------------------------------------------------------------------------------   Medications: Outpatient Medications Prior to Visit  Medication Sig  . azelastine (ASTELIN) 0.1 % nasal  spray Place 2 sprays into both nostrils 2 (two) times daily. Use in each nostril as directed  . Biotin 1 MG CAPS Take by mouth.  . busPIRone (BUSPAR) 10 MG tablet TAKE 1 TABLET(10 MG) BY MOUTH THREE TIMES DAILY  . calcium gluconate 500 MG tablet Take 1 tablet by mouth daily.   . cetirizine (ZYRTEC) 10 MG tablet TAKE 1 TABLET(10 MG) BY MOUTH DAILY  . cyclobenzaprine (FLEXERIL) 10 MG tablet Take 1 tablet (10 mg total) by mouth at bedtime as needed for muscle spasms.  . fluticasone (FLONASE) 50 MCG/ACT nasal spray SHAKE LIQUID AND USE 2 SPRAYS IN EACH NOSTRIL DAILY  . gabapentin (NEURONTIN) 100 MG capsule TAKE 1 CAPSULE(100 MG) BY MOUTH THREE TIMES DAILY  . lisinopril (ZESTRIL) 40 MG tablet TAKE 1 TABLET(40 MG) BY MOUTH DAILY  . Lysine 500 MG TABS Take by mouth as needed.   . montelukast (SINGULAIR) 10 MG tablet TAKE 1 TABLET BY MOUTH EVERY NIGHT AT BEDTIME  . MULTIPLE VITAMINS PO Take by mouth daily.  . Omega-3 Fatty Acids (FISH OIL) 1000 MG CAPS Take by mouth daily.  Marland Kitchen omeprazole (PRILOSEC) 40 MG capsule TAKE 1 CAPSULE(40 MG) BY MOUTH IN THE MORNING AND AT BEDTIME  . polyethylene glycol powder (GLYCOLAX/MIRALAX) 17 GM/SCOOP powder Take 17 g by mouth daily.  . sucralfate (CARAFATE) 1 g tablet Take 1 g by mouth 4 (four) times daily.  . traZODone (DESYREL) 150 MG tablet TAKE 1 TABLET(150  MG) BY MOUTH AT BEDTIME  . triamterene-hydrochlorothiazide (DYAZIDE) 37.5-25 MG capsule Take 1 each (1 capsule total) by mouth daily. Each morning  . vitamin E 100 UNIT capsule Take 100 Units by mouth daily.  . Cholecalciferol 1000 UNITS capsule Take 1,000 Units by mouth daily. (Patient not taking: Reported on 07/27/2021)   No facility-administered medications prior to visit.    Review of Systems  Constitutional:  Negative for appetite change, chills, fatigue and fever.  Respiratory:  Negative for chest tightness and shortness of breath.   Cardiovascular:  Negative for chest pain and palpitations.   Gastrointestinal:  Negative for abdominal pain, nausea and vomiting.  Neurological:  Negative for dizziness and weakness.   {Labs  Heme  Chem  Endocrine  Serology  Results Review (optional):23779}   Objective    BP 134/70 (BP Location: Right Arm, Patient Position: Sitting, Cuff Size: Normal)   Pulse 71   Resp 16   Wt 125 lb (56.7 kg)   SpO2 96%   BMI 23.62 kg/m  {Show previous vital signs (optional):23777}  Physical Exam  ***  No results found for any visits on 11/21/21.  Assessment & Plan     1. Hypertension, unspecified type    Return in about 6 months (around 05/23/2022).      {provider attestation***:1}   Jocelyn Durie, MD  [facility] [phone] (phone) [phone] (fax)  [place]

## 2021-12-21 ENCOUNTER — Telehealth: Payer: Self-pay

## 2021-12-21 NOTE — Telephone Encounter (Signed)
Copied from [place] [id number]. Topic: General - Inquiry >> Dec 21, 2021 12:39 PM Marcellus Scott wrote: Reason for CRM: Pt would like to cancel upcoming AWV and is requesting a call back to reschedule.  Please advise

## 2021-12-28 ENCOUNTER — Other Ambulatory Visit: Payer: Self-pay | Admitting: Family Medicine

## 2021-12-28 DIAGNOSIS — J309 Allergic rhinitis, unspecified: Secondary | ICD-10-CM

## 2022-01-03 ENCOUNTER — Ambulatory Visit (INDEPENDENT_AMBULATORY_CARE_PROVIDER_SITE_OTHER): Payer: Medicare HMO

## 2022-01-03 VITALS — Wt 125.0 lb

## 2022-01-03 DIAGNOSIS — Z780 Asymptomatic menopausal state: Secondary | ICD-10-CM | POA: Diagnosis not present

## 2022-01-03 DIAGNOSIS — Z1231 Encounter for screening mammogram for malignant neoplasm of breast: Secondary | ICD-10-CM

## 2022-01-03 DIAGNOSIS — Z0000 Encounter for general adult medical examination without abnormal findings: Secondary | ICD-10-CM

## 2022-01-03 NOTE — Patient Instructions (Signed)
Jocelyn Harris , Thank you for taking time to come for your Medicare Wellness Visit. I appreciate your ongoing commitment to your health goals. Please review the following plan we discussed and let me know if I can assist you in the future.   Screening recommendations/referrals: Colonoscopy: 12/01/20 Mammogram: 07/16/19, referral sent Bone Density: referral sent Recommended yearly ophthalmology/optometry visit for glaucoma screening and checkup Recommended yearly dental visit for hygiene and checkup  Vaccinations: Influenza vaccine: 02/23/21 Pneumococcal vaccine: 03/26/19, needs 2nd shot Tdap vaccine: 12/23/13 Shingles vaccine: Zostavax 01/20/14   Covid-19:07/30/19, 08/27/19  Advanced directives: no  Conditions/risks identified: none  Next appointment: Follow up in one year for your annual wellness visit 01/07/23 @ 3:30 pm by phone   Preventive Care 65 Years and Older, Female Preventive care refers to lifestyle choices and visits with your health care provider that can promote health and wellness. What does preventive care include? A yearly physical exam. This is also called an annual well check. Dental exams once or twice a year. Routine eye exams. Ask your health care provider how often you should have your eyes checked. Personal lifestyle choices, including: Daily care of your teeth and gums. Regular physical activity. Eating a healthy diet. Avoiding tobacco and drug use. Limiting alcohol use. Practicing safe sex. Taking low-dose aspirin every day. Taking vitamin and mineral supplements as recommended by your health care provider. What happens during an annual well check? The services and screenings done by your health care provider during your annual well check will depend on your age, overall health, lifestyle risk factors, and family history of disease. Counseling  Your health care provider may ask you questions about your: Alcohol use. Tobacco use. Drug use. Emotional  well-being. Home and relationship well-being. Sexual activity. Eating habits. History of falls. Memory and ability to understand (cognition). Work and work Statistician. Reproductive health. Screening  You may have the following tests or measurements: Height, weight, and BMI. Blood pressure. Lipid and cholesterol levels. These may be checked every 5 years, or more frequently if you are over 36 years old. Skin check. Lung cancer screening. You may have this screening every year starting at age 76 if you have a 30-pack-year history of smoking and currently smoke or have quit within the past 15 years. Fecal occult blood test (FOBT) of the stool. You may have this test every year starting at age 50. Flexible sigmoidoscopy or colonoscopy. You may have a sigmoidoscopy every 5 years or a colonoscopy every 10 years starting at age 67. Hepatitis C blood test. Hepatitis B blood test. Sexually transmitted disease (STD) testing. Diabetes screening. This is done by checking your blood sugar (glucose) after you have not eaten for a while (fasting). You may have this done every 1-3 years. Bone density scan. This is done to screen for osteoporosis. You may have this done starting at age 62. Mammogram. This may be done every 1-2 years. Talk to your health care provider about how often you should have regular mammograms. Talk with your health care provider about your test results, treatment options, and if necessary, the need for more tests. Vaccines  Your health care provider may recommend certain vaccines, such as: Influenza vaccine. This is recommended every year. Tetanus, diphtheria, and acellular pertussis (Tdap, Td) vaccine. You may need a Td booster every 10 years. Zoster vaccine. You may need this after age 65. Pneumococcal 13-valent conjugate (PCV13) vaccine. One dose is recommended after age 77. Pneumococcal polysaccharide (PPSV23) vaccine. One dose is recommended after age  64. Talk to your  health care provider about which screenings and vaccines you need and how often you need them. This information is not intended to replace advice given to you by your health care provider. Make sure you discuss any questions you have with your health care provider. Document Released: 07/01/2015 Document Revised: 02/22/2016 Document Reviewed: 04/05/2015 Elsevier Interactive Patient Education  2017 [place] Prevention in the Home Falls can cause injuries. They can happen to people of all ages. There are many things you can do to make your home safe and to help prevent falls. What can I do on the outside of my home? Regularly fix the edges of walkways and driveways and fix any cracks. Remove anything that might make you trip as you walk through a door, such as a raised step or threshold. Trim any bushes or trees on the path to your home. Use bright outdoor lighting. Clear any walking paths of anything that might make someone trip, such as rocks or tools. Regularly check to see if handrails are loose or broken. Make sure that both sides of any steps have handrails. Any raised decks and porches should have guardrails on the edges. Have any leaves, snow, or ice cleared regularly. Use sand or salt on walking paths during winter. Clean up any spills in your garage right away. This includes oil or grease spills. What can I do in the bathroom? Use night lights. Install grab bars by the toilet and in the tub and shower. Do not use towel bars as grab bars. Use non-skid mats or decals in the tub or shower. If you need to sit down in the shower, use a plastic, non-slip stool. Keep the floor dry. Clean up any water that spills on the floor as soon as it happens. Remove soap buildup in the tub or shower regularly. Attach bath mats securely with double-sided non-slip rug tape. Do not have throw rugs and other things on the floor that can make you trip. What can I do in the bedroom? Use night  lights. Make sure that you have a light by your bed that is easy to reach. Do not use any sheets or blankets that are too big for your bed. They should not hang down onto the floor. Have a firm chair that has side arms. You can use this for support while you get dressed. Do not have throw rugs and other things on the floor that can make you trip. What can I do in the kitchen? Clean up any spills right away. Avoid walking on wet floors. Keep items that you use a lot in easy-to-reach places. If you need to reach something above you, use a strong step stool that has a grab bar. Keep electrical cords out of the way. Do not use floor polish or wax that makes floors slippery. If you must use wax, use non-skid floor wax. Do not have throw rugs and other things on the floor that can make you trip. What can I do with my stairs? Do not leave any items on the stairs. Make sure that there are handrails on both sides of the stairs and use them. Fix handrails that are broken or loose. Make sure that handrails are as long as the stairways. Check any carpeting to make sure that it is firmly attached to the stairs. Fix any carpet that is loose or worn. Avoid having throw rugs at the top or bottom of the stairs. If you do have  throw rugs, attach them to the floor with carpet tape. Make sure that you have a light switch at the top of the stairs and the bottom of the stairs. If you do not have them, ask someone to add them for you. What else can I do to help prevent falls? Wear shoes that: Do not have high heels. Have rubber bottoms. Are comfortable and fit you well. Are closed at the toe. Do not wear sandals. If you use a stepladder: Make sure that it is fully opened. Do not climb a closed stepladder. Make sure that both sides of the stepladder are locked into place. Ask someone to hold it for you, if possible. Clearly mark and make sure that you can see: Any grab bars or handrails. First and last  steps. Where the edge of each step is. Use tools that help you move around (mobility aids) if they are needed. These include: Canes. Walkers. Scooters. Crutches. Turn on the lights when you go into a dark area. Replace any light bulbs as soon as they burn out. Set up your furniture so you have a clear path. Avoid moving your furniture around. If any of your floors are uneven, fix them. If there are any pets around you, be aware of where they are. Review your medicines with your doctor. Some medicines can make you feel dizzy. This can increase your chance of falling. Ask your doctor what other things that you can do to help prevent falls. This information is not intended to replace advice given to you by your health care provider. Make sure you discuss any questions you have with your health care provider. Document Released: 03/31/2009 Document Revised: 11/10/2015 Document Reviewed: 07/09/2014 Elsevier Interactive Patient Education  2017 [employer].

## 2022-01-03 NOTE — Progress Notes (Signed)
Virtual Visit via Telephone Note  I connected with  Jocelyn Harris on 01/03/22 at  3:30 PM EDT by telephone and verified that I am speaking with the correct person using two identifiers.  Location: Patient: home Provider: [facility] Persons participating in the virtual visit: [place]   I discussed the limitations, risks, security and privacy concerns of performing an evaluation and management service by telephone and the availability of in person appointments. The patient expressed understanding and agreed to proceed.  Interactive audio and video telecommunications were attempted between this nurse and patient, however failed, due to patient having technical difficulties OR patient did not have access to video capability.  We continued and completed visit with audio only.  Some vital signs may be absent or patient reported.   Dionisio David, LPN  Subjective:   Jocelyn Harris is a 71 y.o. female who presents for Medicare Annual (Subsequent) preventive examination.  Review of Systems     Cardiac Risk Factors include: advanced age (>9mn, >>33women);dyslipidemia;hypertension     Objective:    There were no vitals filed for this visit. There is no height or weight on file to calculate BMI.     01/03/2022    3:39 PM 10/26/2021    2:36 PM 07/27/2021    3:04 PM 04/25/2021   12:55 PM 01/19/2021    7:00 AM 01/17/2021    1:48 PM 12/01/2020    6:39 AM  Advanced Directives  Does Patient Have a Medical Advance Directive? No No No No No No No  Would patient like information on creating a medical advance directive? No - Patient declined No - Patient declined  No - Patient declined No - Patient declined No - Patient declined     Current Medications (verified) Outpatient Encounter Medications as of 01/03/2022  Medication Sig   azelastine (ASTELIN) 0.1 % nasal spray Place 2 sprays into both nostrils 2 (two) times daily. Use in each nostril as directed   Biotin 1 MG CAPS Take  by mouth.   busPIRone (BUSPAR) 10 MG tablet TAKE 1 TABLET(10 MG) BY MOUTH THREE TIMES DAILY   calcium gluconate 500 MG tablet Take 1 tablet by mouth daily.    cetirizine (ZYRTEC) 10 MG tablet TAKE 1 TABLET(10 MG) BY MOUTH DAILY   Cholecalciferol 1000 UNITS capsule Take 1,000 Units by mouth daily.   cyclobenzaprine (FLEXERIL) 10 MG tablet Take 1 tablet (10 mg total) by mouth at bedtime as needed for muscle spasms.   fluticasone (FLONASE) 50 MCG/ACT nasal spray SHAKE LIQUID AND USE 2 SPRAYS IN EACH NOSTRIL DAILY   gabapentin (NEURONTIN) 100 MG capsule TAKE 1 CAPSULE(100 MG) BY MOUTH THREE TIMES DAILY   lisinopril (ZESTRIL) 40 MG tablet TAKE 1 TABLET(40 MG) BY MOUTH DAILY   montelukast (SINGULAIR) 10 MG tablet TAKE 1 TABLET BY MOUTH EVERY NIGHT AT BEDTIME   omeprazole (PRILOSEC) 40 MG capsule TAKE 1 CAPSULE(40 MG) BY MOUTH IN THE MORNING AND AT BEDTIME   polyethylene glycol powder (GLYCOLAX/MIRALAX) 17 GM/SCOOP powder Take 17 g by mouth daily.   sucralfate (CARAFATE) 1 g tablet Take 1 tablet (1 g total) by mouth 4 (four) times daily.   traZODone (DESYREL) 150 MG tablet TAKE 1 TABLET(150 MG) BY MOUTH AT BEDTIME   Lysine 500 MG TABS Take by mouth as needed.  (Patient not taking: Reported on 01/03/2022)   MULTIPLE VITAMINS PO Take by mouth daily. (Patient not taking: Reported on 01/03/2022)   Omega-3 Fatty Acids (FISH OIL) 1000  MG CAPS Take by mouth daily. (Patient not taking: Reported on 01/03/2022)   triamterene-hydrochlorothiazide (DYAZIDE) 37.5-25 MG capsule Take 1 each (1 capsule total) by mouth daily. Each morning (Patient not taking: Reported on 01/03/2022)   vitamin E 100 UNIT capsule Take 100 Units by mouth daily. (Patient not taking: Reported on 01/03/2022)   No facility-administered encounter medications on file as of 01/03/2022.    Allergies (verified) Augmentin [amoxicillin-pot clavulanate] and Doxycycline   History: Past Medical History:  Diagnosis Date   Anemia    GERD  (gastroesophageal reflux disease)    Hypertension    Iron deficiency anemia 01/17/2021   Positive fecal occult blood test    TMJ (dislocation of temporomandibular joint)    Past Surgical History:  Procedure Laterality Date   COLONOSCOPY WITH PROPOFOL N/A 12/01/2020   Procedure: COLONOSCOPY WITH PROPOFOL;  Surgeon: Virgel Manifold, MD;  Location: [facility] ENDOSCOPY;  Service: Endoscopy;  Laterality: N/A;   ESOPHAGOGASTRODUODENOSCOPY (EGD) WITH PROPOFOL N/A 12/01/2020   Procedure: ESOPHAGOGASTRODUODENOSCOPY (EGD) WITH PROPOFOL;  Surgeon: Virgel Manifold, MD;  Location: [facility] ENDOSCOPY;  Service: Endoscopy;  Laterality: N/A;   GIVENS CAPSULE STUDY N/A 03/07/2021   Procedure: GIVENS CAPSULE STUDY;  Surgeon: Virgel Manifold, MD;  Location: [facility] ENDOSCOPY;  Service: Endoscopy;  Laterality: N/A;   KNEE SURGERY Right    TOE SURGERY     TUBAL LIGATION     Family History  Problem Relation Age of Onset   Diabetes Mother    Hypertension Mother    Congestive Heart Failure Mother    Hypertension Father    Lung cancer Father    Fibromyalgia Sister    Lupus Sister    Cancer Sister        of blood   Leukemia Paternal Grandmother    Healthy Daughter    Social History   Socioeconomic History   Marital status: Divorced    Spouse name: Not on file   Number of children: 1   Years of education: Not on file   Highest education level: Some college, no degree  Occupational History   Occupation: customer service    Comment: part time  Tobacco Use   Smoking status: Never   Smokeless tobacco: Never  Vaping Use   Vaping Use: Never used  Substance and Sexual Activity   Alcohol use: No    Alcohol/week: 0.0 standard drinks of alcohol   Drug use: No   Sexual activity: Not Currently    Birth control/protection: None  Other Topics Concern   Not on file  Social History Narrative   Not on file   Social Determinants of Health   Financial Resource Strain: Low Risk  (01/03/2022)   Overall  Financial Resource Strain (CARDIA)    Difficulty of Paying Living Expenses: Not hard at all  Food Insecurity: No Food Insecurity (01/03/2022)   Hunger Vital Sign    Worried About Running Out of Food in the Last Year: Never true    Ran Out of Food in the Last Year: Never true  Transportation Needs: No Transportation Needs (01/03/2022)   PRAPARE - Hydrologist (Medical): No    Lack of Transportation (Non-Medical): No  Physical Activity: Insufficiently Active (01/03/2022)   Exercise Vital Sign    Days of Exercise per Week: 3 days    Minutes of Exercise per Session: 30 min  Stress: No Stress Concern Present (01/03/2022)   [place]  Feeling of Stress : Not at all  Social Connections: Moderately Integrated (01/03/2022)   Social Connection and Isolation Panel [NHANES]    Frequency of Communication with Friends and Family: More than three times a week    Frequency of Social Gatherings with Friends and Family: Once a week    Attends Religious Services: More than 4 times per year    Active Member of [employer] or Organizations: Yes    Attends Archivist Meetings: Never    Marital Status: Divorced    Tobacco Counseling Counseling given: Not Answered   Clinical Intake:  Pre-visit preparation completed: Yes  Pain : No/denies pain     Nutritional Risks: None Diabetes: No  How often do you need to have someone help you when you read instructions, pamphlets, or other written materials from your doctor or pharmacy?: 1 - Never  Diabetic?no  Interpreter Needed?: No  Information entered by :: Kirke Shaggy, LPN   Activities of Daily Living    01/03/2022    3:40 PM  In your present state of health, do you have any difficulty performing the following activities:  Hearing? 0  Vision? 0  Difficulty concentrating or making decisions? 0  Walking or climbing stairs? 0  Dressing or  bathing? 0  Doing errands, shopping? 0  Preparing Food and eating ? N  Using the Toilet? N  In the past six months, have you accidently leaked urine? N  Do you have problems with loss of bowel control? N  Managing your Medications? N  Managing your Finances? N  Housekeeping or managing your Housekeeping? N    Patient Care Team: Jerrol Banana., MD as PCP - General (Family Medicine) Rubie Maid, MD as Referring Physician (Obstetrics and Gynecology)  Indicate any recent Medical Services you may have received from other than [facility] providers in the past year (date may be approximate).     Assessment:   This is a routine wellness examination for Jocelyn Harris.  Hearing/Vision screen Hearing Screening - Comments:: No aids Vision Screening - Comments:: Wears glasses- [place]  Dietary issues and exercise activities discussed: Current Exercise Habits: Home exercise routine, Type of exercise: walking, Time (Minutes): 30, Frequency (Times/Week): 3, Weekly Exercise (Minutes/Week): 90, Intensity: Mild   Goals Addressed             This Visit's Progress    DIET - EAT MORE FRUITS AND VEGETABLES         Depression Screen    01/03/2022    3:37 PM 07/01/2020    9:27 AM 03/08/2020   10:25 AM 11/26/2019    9:51 AM 11/20/2018   11:13 AM 10/24/2018    9:11 AM 08/06/2018   10:37 AM  PHQ 2/9 Scores  PHQ - 2 Score 0 0 0 0 0 0 0  PHQ- 9 Score 0 0 2        Fall Risk    01/03/2022    3:40 PM 03/08/2020   10:25 AM 11/26/2019    9:56 AM 11/20/2018   11:13 AM 08/06/2018   10:37 AM  Fall Risk   Falls in the past year? 0 0 0 0 0  Number falls in past yr: 0 0 0    Injury with Fall? 0 0 0 0   Risk for fall due to : No Fall Risks      Follow up Falls evaluation completed Falls evaluation completed  Falls evaluation completed     FALL RISK PREVENTION PERTAINING TO  THE HOME:  Any stairs in or around the home? No  If so, are there any without handrails? No  Home free of loose throw  rugs in walkways, pet beds, electrical cords, etc? Yes  Adequate lighting in your home to reduce risk of falls? Yes   ASSISTIVE DEVICES UTILIZED TO PREVENT FALLS:  Life alert? No  Use of a cane, walker or w/c? No  Grab bars in the bathroom? Yes  Shower chair or bench in shower? No  Elevated toilet seat or a handicapped toilet? No   Cognitive Function:        01/03/2022    3:42 PM 11/26/2019    9:57 AM  6CIT Screen  What Year? 0 points 0 points  What month? 0 points 0 points  What time? 0 points 0 points  Count back from 20 0 points 0 points  Months in reverse 0 points 0 points  Repeat phrase 0 points 0 points  Total Score 0 points 0 points    Immunizations Immunization History  Administered Date(s) Administered   Fluad Quad(high Dose 65+) 03/08/2020, 02/23/2021   Influenza Split 04/27/2006, 06/06/2011   Influenza, High Dose Seasonal PF 03/17/2018, 03/12/2019   Influenza,inj,Quad PF,6+ Mos 03/11/2017   Influenza-Unspecified 06/16/2015   Moderna Sars-Covid-2 Vaccination 07/30/2019, 08/27/2019   Pneumococcal Polysaccharide-23 03/26/2019   Tdap 12/23/2013   Zoster, Live 01/20/2014    TDAP status: Up to date  Flu Vaccine status: Up to date  Pneumococcal vaccine status: Due, Education has been provided regarding the importance of this vaccine. Advised may receive this vaccine at local pharmacy or Health Dept. Aware to provide a copy of the vaccination record if obtained from local pharmacy or Health Dept. Verbalized acceptance and understanding.  Covid-19 vaccine status: Completed vaccines  Qualifies for Shingles Vaccine? Yes   Zostavax completed Yes   Shingrix Completed?: No.    Education has been provided regarding the importance of this vaccine. Patient has been advised to call insurance company to determine out of pocket expense if they have not yet received this vaccine. Advised may also receive vaccine at local pharmacy or Health Dept. Verbalized acceptance and  understanding.  Screening Tests Health Maintenance  Topic Date Due   Zoster Vaccines- Shingrix (1 of 2) Never done   DEXA SCAN  04/30/2007   COVID-19 Vaccine (3 - Moderna risk series) 09/24/2019   Pneumonia Vaccine 66+ Years old (2 - PCV) 03/25/2020   MAMMOGRAM  07/08/2021   INFLUENZA VACCINE  01/16/2022   TETANUS/TDAP  12/24/2023   COLONOSCOPY (Pts 45-14yr Insurance coverage will need to be confirmed)  12/02/2030   Hepatitis C Screening  Completed   HPV VACCINES  Aged Out    Health Maintenance  Health Maintenance Due  Topic Date Due   Zoster Vaccines- Shingrix (1 of 2) Never done   DEXA SCAN  04/30/2007   COVID-19 Vaccine (3 - Moderna risk series) 09/24/2019   Pneumonia Vaccine 69 Years old (2 - PCV) 03/25/2020   MAMMOGRAM  07/08/2021    Colorectal cancer screening: Type of screening: Colonoscopy. Completed 12/01/20. Repeat every 10 years  Mammogram status: Completed 07/16/19. Repeat every year- referral sent  Bone Density status: Ordered today. Pt provided with contact info and advised to call to schedule appt.  Lung Cancer Screening: (Low Dose CT Chest recommended if Age 69-80years, 30 pack-year currently smoking OR have quit w/in 15years.) does not qualify.   Additional Screening:  Hepatitis C Screening: does qualify; Completed 05/12/18  Vision Screening: Recommended  annual ophthalmology exams for early detection of glaucoma and other disorders of the eye. Is the patient up to date with their annual eye exam?  Yes  Who is the provider or what is the name of the office in which the patient attends annual eye exams? [facility] If pt is not established with a provider, would they like to be referred to a provider to establish care? No .   Dental Screening: Recommended annual dental exams for proper oral hygiene  Community Resource Referral / Chronic Care Management: CRR required this visit?  No   CCM required this visit?  No      Plan:     I have  personally reviewed and noted the following in the patient's chart:   Medical and social history Use of alcohol, tobacco or illicit drugs  Current medications and supplements including opioid prescriptions.  Functional ability and status Nutritional status Physical activity Advanced directives List of other physicians Hospitalizations, surgeries, and ER visits in previous 12 months Vitals Screenings to include cognitive, depression, and falls Referrals and appointments  In addition, I have reviewed and discussed with patient certain preventive protocols, quality metrics, and best practice recommendations. A written personalized care plan for preventive services as well as general preventive health recommendations were provided to patient.     Dionisio David, LPN   0/21/1155   Nurse Notes: none

## 2022-01-12 ENCOUNTER — Other Ambulatory Visit: Payer: Self-pay | Admitting: Family Medicine

## 2022-01-12 DIAGNOSIS — I10 Essential (primary) hypertension: Secondary | ICD-10-CM

## 2022-01-12 NOTE — Telephone Encounter (Signed)
Requested Prescriptions  Pending Prescriptions Disp Refills  . lisinopril (ZESTRIL) 40 MG tablet [Pharmacy Med Name: LISINOPRIL '40MG'$  TABLETS] 90 tablet 0    Sig: TAKE 1 TABLET(40 MG) BY MOUTH DAILY     Cardiovascular:  ACE Inhibitors Failed - 01/12/2022  8:04 AM      Failed - Cr in normal range and within 180 days    Creatinine, Ser  Date Value Ref Range Status  06/29/2021 0.86 0.57 - 1.00 mg/dL Final         Failed - K in normal range and within 180 days    Potassium  Date Value Ref Range Status  12/22/2020 4.2 3.5 - 5.2 mmol/L Final         Passed - Patient is not pregnant      Passed - Last BP in normal range    BP Readings from Last 1 Encounters:  11/21/21 134/70         Passed - Valid encounter within last 6 months    Recent Outpatient Visits          1 month ago Hypertension, unspecified type   [facility] Jerrol Banana., MD   4 months ago Primary hypertension   [facility] Jerrol Banana., MD   6 months ago Viral upper respiratory tract infection   [facility] Thedore Mins, [place], PA-C   8 months ago Primary hypertension   [facility] Jerrol Banana., MD   10 months ago Primary hypertension   [facility] Jerrol Banana., MD      Future Appointments            In 4 months Jerrol Banana., MD [facility], [place]

## 2022-01-23 ENCOUNTER — Telehealth: Payer: Self-pay | Admitting: Family Medicine

## 2022-01-23 ENCOUNTER — Other Ambulatory Visit: Payer: Self-pay | Admitting: Family Medicine

## 2022-01-23 DIAGNOSIS — Z780 Asymptomatic menopausal state: Secondary | ICD-10-CM | POA: Diagnosis not present

## 2022-01-23 DIAGNOSIS — M810 Age-related osteoporosis without current pathological fracture: Secondary | ICD-10-CM | POA: Diagnosis not present

## 2022-01-23 DIAGNOSIS — I10 Essential (primary) hypertension: Secondary | ICD-10-CM

## 2022-01-23 DIAGNOSIS — F32A Depression, unspecified: Secondary | ICD-10-CM

## 2022-01-23 LAB — HM DEXA SCAN

## 2022-01-23 NOTE — Telephone Encounter (Signed)
Reason for CRM: Medication Refill - Medication: lisinopril (ZESTRIL) 40 MG tablet [[id number]]    Has the patient contacted their pharmacy? No. The patient was uncertain of the medication's status .   (Agent: If no, request that the patient contact the pharmacy for the refill. If patient does not wish to contact the pharmacy document the reason why and proceed with request.) (Agent: If yes, when and what did the pharmacy advise?)   Preferred Pharmacy (with phone number or street name): [id number] [address], [place] [address] Phone: [phone] Fax: [phone] Hours: Not open 24 hours     Has the patient been seen for an appointment in the last year OR does the patient have an upcoming appointment? Yes.     Agent: Please be advised that RX refills may take up to 3 business days. We ask that you follow-up with your pharmacy.

## 2022-01-23 NOTE — Telephone Encounter (Signed)
Copied from [place] [id number]. Topic: General - Other >> Jan 23, 2022  3:48 PM Everette C wrote: Reason for CRM: Medication Refill - Medication: lisinopril (ZESTRIL) 40 MG tablet [[id number]]   Has the patient contacted their pharmacy? No. The patient was uncertain of the medication's status .   (Agent: If no, request that the patient contact the pharmacy for the refill. If patient does not wish to contact the pharmacy document the reason why and proceed with request.) (Agent: If yes, when and what did the pharmacy advise?)  Preferred Pharmacy (with phone number or street name): [facility] #[id number] [address], [place] [address] Phone: [phone] Fax: [phone] Hours: Not open 24 hours   Has the patient been seen for an appointment in the last year OR does the patient have an upcoming appointment? Yes.    Agent: Please be advised that RX refills may take up to 3 business days. We ask that you follow-up with your pharmacy.

## 2022-01-24 ENCOUNTER — Telehealth: Payer: Self-pay

## 2022-01-24 DIAGNOSIS — I10 Essential (primary) hypertension: Secondary | ICD-10-CM

## 2022-01-24 MED ORDER — LISINOPRIL 40 MG PO TABS: 40.0000 mg | ORAL_TABLET | Freq: Every day | ORAL | 1 refills | Status: DC

## 2022-01-24 NOTE — Telephone Encounter (Signed)
last RF 01/12/22 #90 1 RF  Requested Prescriptions  Refused Prescriptions Disp Refills  . lisinopril (ZESTRIL) 40 MG tablet 90 tablet 1     Cardiovascular:  ACE Inhibitors Failed - 01/23/2022  3:53 PM      Failed - Cr in normal range and within 180 days    Creatinine, Ser  Date Value Ref Range Status  06/29/2021 0.86 0.57 - 1.00 mg/dL Final         Failed - K in normal range and within 180 days    Potassium  Date Value Ref Range Status  12/22/2020 4.2 3.5 - 5.2 mmol/L Final         Passed - Patient is not pregnant      Passed - Last BP in normal range    BP Readings from Last 1 Encounters:  11/21/21 134/70         Passed - Valid encounter within last 6 months    Recent Outpatient Visits          2 months ago Hypertension, unspecified type   [facility] Jerrol Banana., MD   5 months ago Primary hypertension   [facility] Jerrol Banana., MD   7 months ago Viral upper respiratory tract infection   [facility] Thedore Mins, [place], PA-C   9 months ago Primary hypertension   [facility] Jerrol Banana., MD   11 months ago Primary hypertension   [facility] Jerrol Banana., MD      Future Appointments            In 4 months Jerrol Banana., MD [facility], [place]

## 2022-01-24 NOTE — Telephone Encounter (Signed)
Patient advised rx was sent to pharmacy. Also advised her bone density results are not back yet.

## 2022-01-24 NOTE — Telephone Encounter (Signed)
Copied from [place] [phone] [id number]. Topic: General - Other >> Jan 23, 2022  3:46 PM Everette C wrote: Reason for CRM: The patient would like to speak with a member of clinical staff regarding their refill of busPIRone (BUSPAR) 10 MG tablet [[id number]]   Please contact further when possible

## 2022-01-24 NOTE — Telephone Encounter (Signed)
LMOVM for pt to return call 

## 2022-01-24 NOTE — Telephone Encounter (Signed)
LMOVM for pt to return call. Okay for pec to ask patient what she needs. Thank you.

## 2022-01-24 NOTE — Telephone Encounter (Signed)
Patient returning call  Attempted to call [facility] flow line, call was unsuccessful   Please fu w/ patient

## 2022-02-14 MED FILL — Iron Sucrose Inj 20 MG/ML (Fe Equiv): INTRAVENOUS | Qty: 10 | Status: AC

## 2022-02-14 NOTE — Progress Notes (Signed)
[place]  [phone] [phone]  ID: Jocelyn Harris OB: 12-20-1950  MR#: [id number]  VOJ#:[id number]  Patient Care Team: Jerrol Banana., MD as PCP - General (Family Medicine) Rubie Maid, MD as Referring Physician (Obstetrics and Gynecology)  CHIEF COMPLAINT: Iron deficiency anemia.  INTERVAL HISTORY: Patient returns to clinic today for repeat laboratory work, further evaluation, and consideration of IV Venofer if needed.  She currently feels well and is asymptomatic.  She does not complain of weakness or fatigue today.  She has no neurologic complaints.  She denies any recent fevers or illnesses.  She has a good appetite and denies weight loss.  She has no chest pain, shortness of breath, cough, or hemoptysis.  She denies any nausea, vomiting, constipation, or diarrhea.  She has no melena or hematochezia.  She has no urinary complaints.  Patient offers no specific complaints today.  REVIEW OF SYSTEMS:   Review of Systems  Constitutional: Negative.  Negative for fever, malaise/fatigue and weight loss.  Respiratory: Negative.  Negative for cough, hemoptysis and shortness of breath.   Cardiovascular: Negative.  Negative for chest pain and leg swelling.  Gastrointestinal: Negative.  Negative for abdominal pain, blood in stool, constipation, heartburn and melena.  Genitourinary: Negative.  Negative for frequency.  Musculoskeletal: Negative.  Negative for back pain.  Skin: Negative.  Negative for itching and rash.  Neurological: Negative.  Negative for dizziness, speech change, focal weakness and headaches.  Psychiatric/Behavioral: Negative.  The patient is not nervous/anxious.     As per HPI. Otherwise, a complete review of systems is negative.  PAST MEDICAL HISTORY: Past Medical History:  Diagnosis Date   Anemia    GERD (gastroesophageal reflux disease)    Hypertension    Iron deficiency anemia 01/17/2021   Positive fecal occult  blood test    TMJ (dislocation of temporomandibular joint)     PAST SURGICAL HISTORY: Past Surgical History:  Procedure Laterality Date   COLONOSCOPY WITH PROPOFOL N/A 12/01/2020   Procedure: COLONOSCOPY WITH PROPOFOL;  Surgeon: Virgel Manifold, MD;  Location: [facility] ENDOSCOPY;  Service: Endoscopy;  Laterality: N/A;   ESOPHAGOGASTRODUODENOSCOPY (EGD) WITH PROPOFOL N/A 12/01/2020   Procedure: ESOPHAGOGASTRODUODENOSCOPY (EGD) WITH PROPOFOL;  Surgeon: Virgel Manifold, MD;  Location: [facility] ENDOSCOPY;  Service: Endoscopy;  Laterality: N/A;   GIVENS CAPSULE STUDY N/A 03/07/2021   Procedure: GIVENS CAPSULE STUDY;  Surgeon: Virgel Manifold, MD;  Location: [facility] ENDOSCOPY;  Service: Endoscopy;  Laterality: N/A;   KNEE SURGERY Right    TOE SURGERY     TUBAL LIGATION      FAMILY HISTORY: Family History  Problem Relation Age of Onset   Diabetes Mother    Hypertension Mother    Congestive Heart Failure Mother    Hypertension Father    Lung cancer Father    Fibromyalgia Sister    Lupus Sister    Cancer Sister        of blood   Leukemia Paternal Grandmother    Healthy Daughter     ADVANCED DIRECTIVES (Y/N):  N  HEALTH MAINTENANCE: Social History   Tobacco Use   Smoking status: Never   Smokeless tobacco: Never  Vaping Use   Vaping Use: Never used  Substance Use Topics   Alcohol use: No    Alcohol/week: 0.0 standard drinks of alcohol   Drug use: No     Colonoscopy:  PAP:  Bone density:  Lipid panel:  Allergies  Allergen Reactions   Augmentin [Amoxicillin-Pot Clavulanate]  Nausea Only    Patient reports that her reflux was bad when she took medication   Doxycycline Nausea Only and Nausea And Vomiting    Current Outpatient Medications  Medication Sig Dispense Refill   azelastine (ASTELIN) 0.1 % nasal spray Place 2 sprays into both nostrils 2 (two) times daily. Use in each nostril as directed 30 mL 3   Biotin 1 MG CAPS Take by mouth.     busPIRone (BUSPAR) 10 MG  tablet TAKE 1 TABLET(10 MG) BY MOUTH THREE TIMES DAILY 90 tablet 4   calcium gluconate 500 MG tablet Take 1 tablet by mouth daily.      cetirizine (ZYRTEC) 10 MG tablet TAKE 1 TABLET(10 MG) BY MOUTH DAILY 90 tablet 1   Cholecalciferol 1000 UNITS capsule Take 1,000 Units by mouth daily.     cyclobenzaprine (FLEXERIL) 10 MG tablet Take 1 tablet (10 mg total) by mouth at bedtime as needed for muscle spasms. 30 tablet 1   fluticasone (FLONASE) 50 MCG/ACT nasal spray SHAKE LIQUID AND USE 2 SPRAYS IN EACH NOSTRIL DAILY 16 g 2   gabapentin (NEURONTIN) 100 MG capsule TAKE 1 CAPSULE(100 MG) BY MOUTH THREE TIMES DAILY 90 capsule 2   lisinopril (ZESTRIL) 40 MG tablet Take 1 tablet (40 mg total) by mouth daily. 90 tablet 1   montelukast (SINGULAIR) 10 MG tablet TAKE 1 TABLET BY MOUTH EVERY NIGHT AT BEDTIME 90 tablet 2   Multiple Vitamins-Minerals (HAIR SKIN AND NAILS FORMULA PO) Take by mouth.     polyethylene glycol powder (GLYCOLAX/MIRALAX) 17 GM/SCOOP powder Take 17 g by mouth daily. 507 g 1   sucralfate (CARAFATE) 1 g tablet Take 1 tablet (1 g total) by mouth 4 (four) times daily. 120 tablet 0   traZODone (DESYREL) 150 MG tablet TAKE 1 TABLET(150 MG) BY MOUTH AT BEDTIME 30 tablet 4   Lysine 500 MG TABS Take by mouth as needed.  (Patient not taking: Reported on 01/03/2022)     MULTIPLE VITAMINS PO Take by mouth daily. (Patient not taking: Reported on 01/03/2022)     Omega-3 Fatty Acids (FISH OIL) 1000 MG CAPS Take by mouth daily. (Patient not taking: Reported on 01/03/2022)     omeprazole (PRILOSEC) 40 MG capsule TAKE 1 CAPSULE(40 MG) BY MOUTH IN THE MORNING AND AT BEDTIME 60 capsule 2   triamterene-hydrochlorothiazide (DYAZIDE) 37.5-25 MG capsule Take 1 each (1 capsule total) by mouth daily. Each morning (Patient not taking: Reported on 01/03/2022) 30 capsule 11   vitamin E 100 UNIT capsule Take 100 Units by mouth daily. (Patient not taking: Reported on 01/03/2022)     No current facility-administered  medications for this visit.    OBJECTIVE: Vitals:   02/15/22 1424  BP: (!) 122/91  Pulse: 63  Resp: 16  Temp: (!) 97.4 F (36.3 C)  SpO2: 100%     Body mass index is 23.92 kg/m.    ECOG FS:0 - Asymptomatic  General: Well-developed, well-nourished, no acute distress. Eyes: Pink conjunctiva, anicteric sclera. HEENT: Normocephalic, moist mucous membranes. Lungs: No audible wheezing or coughing. Heart: Regular rate and rhythm. Abdomen: Soft, nontender, no obvious distention. Musculoskeletal: No edema, cyanosis, or clubbing. Neuro: Alert, answering all questions appropriately. Cranial nerves grossly intact. Skin: No rashes or petechiae noted. Psych: Normal affect.   LAB RESULTS:  Lab Results  Component Value Date   NA 141 12/22/2020   K 4.2 12/22/2020   CL 102 12/22/2020   CO2 26 12/22/2020   GLUCOSE 83 12/22/2020   BUN  22 06/29/2021   CREATININE 0.86 06/29/2021   CALCIUM 9.8 12/22/2020   PROT 6.6 12/22/2020   ALBUMIN 4.6 12/22/2020   AST 11 12/22/2020   ALT 10 12/22/2020   ALKPHOS 72 12/22/2020   BILITOT 0.2 12/22/2020   GFRNONAA 80 01/28/2020   GFRAA 92 01/28/2020    Lab Results  Component Value Date   WBC 9.1 02/15/2022   NEUTROABS 5.0 02/15/2022   HGB 13.1 02/15/2022   HCT 40.2 02/15/2022   MCV 92.0 02/15/2022   PLT 243 02/15/2022   Lab Results  Component Value Date   IRON 74 02/15/2022   TIBC 304 02/15/2022   IRONPCTSAT 24 02/15/2022   Lab Results  Component Value Date   FERRITIN 110 02/15/2022     STUDIES: No results found.  ASSESSMENT: Iron deficiency anemia.    PLAN:    Iron deficiency anemia: Patient's hemoglobin and iron stores continue to be within normal limits.  Previously, all of her other laboratory work was either negative or within normal limits.  Patient reports constipation with oral iron supplementation.  Patient last received IV Venofer on Oct 26, 2021.  She has not required additional treatment today.  Return to clinic in 6  months with repeat laboratory work and further evaluation.  If patient's laboratory work continues to be within normal limits and she does not require treatment, she could possibly be discharged from clinic.    I spent a total of 20 minutes reviewing chart data, face-to-face evaluation with the patient, counseling and coordination of care as detailed above.   Patient expressed understanding and was in agreement with this plan. She also understands that She can call clinic at any time with any questions, concerns, or complaints.    Lloyd Huger, MD   02/18/2022 7:40 AM

## 2022-02-15 ENCOUNTER — Inpatient Hospital Stay: Payer: Medicare HMO | Attending: Oncology

## 2022-02-15 ENCOUNTER — Inpatient Hospital Stay (HOSPITAL_BASED_OUTPATIENT_CLINIC_OR_DEPARTMENT_OTHER): Payer: Medicare HMO | Admitting: Oncology

## 2022-02-15 ENCOUNTER — Encounter: Payer: Self-pay | Admitting: Oncology

## 2022-02-15 ENCOUNTER — Inpatient Hospital Stay: Payer: Medicare HMO

## 2022-02-15 VITALS — BP 122/91 | HR 63 | Temp 97.4°F | Resp 16 | Wt 126.6 lb

## 2022-02-15 DIAGNOSIS — D509 Iron deficiency anemia, unspecified: Secondary | ICD-10-CM | POA: Insufficient documentation

## 2022-02-15 LAB — CBC WITH DIFFERENTIAL/PLATELET
Abs Immature Granulocytes: 0.02 10*3/uL (ref 0.00–0.07)
Basophils Absolute: 0.1 10*3/uL (ref 0.0–0.1)
Basophils Relative: 1 %
Eosinophils Absolute: 0.1 10*3/uL (ref 0.0–0.5)
Eosinophils Relative: 1 %
HCT: 40.2 % (ref 36.0–46.0)
Hemoglobin: 13.1 g/dL (ref 12.0–15.0)
Immature Granulocytes: 0 %
Lymphocytes Relative: 38 %
Lymphs Abs: 3.4 10*3/uL (ref 0.7–4.0)
MCH: 30 pg (ref 26.0–34.0)
MCHC: 32.6 g/dL (ref 30.0–36.0)
MCV: 92 fL (ref 80.0–100.0)
Monocytes Absolute: 0.6 10*3/uL (ref 0.1–1.0)
Monocytes Relative: 6 %
Neutro Abs: 5 10*3/uL (ref 1.7–7.7)
Neutrophils Relative %: 54 %
Platelets: 243 10*3/uL (ref 150–400)
RBC: 4.37 MIL/uL (ref 3.87–5.11)
RDW: 12.2 % (ref 11.5–15.5)
WBC: 9.1 10*3/uL (ref 4.0–10.5)
nRBC: 0 % (ref 0.0–0.2)

## 2022-02-15 LAB — IRON AND TIBC
Iron: 74 ug/dL (ref 28–170)
Saturation Ratios: 24 % (ref 10.4–31.8)
TIBC: 304 ug/dL (ref 250–450)
UIBC: 230 ug/dL

## 2022-02-15 LAB — FERRITIN: Ferritin: 110 ng/mL (ref 11–307)

## 2022-02-16 ENCOUNTER — Other Ambulatory Visit: Payer: Self-pay | Admitting: Family Medicine

## 2022-02-16 DIAGNOSIS — K219 Gastro-esophageal reflux disease without esophagitis: Secondary | ICD-10-CM

## 2022-02-16 NOTE — Telephone Encounter (Signed)
Requested Prescriptions  Pending Prescriptions Disp Refills  . omeprazole (PRILOSEC) 40 MG capsule [Pharmacy Med Name: OMEPRAZOLE '40MG'$  CAPSULES] 60 capsule 2    Sig: TAKE 1 CAPSULE(40 MG) BY MOUTH IN THE MORNING AND AT BEDTIME     Gastroenterology: Proton Pump Inhibitors Passed - 02/16/2022  3:40 AM      Passed - Valid encounter within last 12 months    Recent Outpatient Visits          2 months ago Hypertension, unspecified type   [facility] Jerrol Banana., MD   5 months ago Primary hypertension   [facility] Jerrol Banana., MD   7 months ago Viral upper respiratory tract infection   [facility] Mikey Kirsch[place], PA-C   9 months ago Primary hypertension   [facility] Jerrol Banana., MD   11 months ago Primary hypertension   [facility] Jerrol Banana., MD      Future Appointments            In 3 months Jerrol Banana., MD [facility], [place]

## 2022-02-18 ENCOUNTER — Encounter: Payer: Self-pay | Admitting: Oncology

## 2022-03-06 ENCOUNTER — Ambulatory Visit (INDEPENDENT_AMBULATORY_CARE_PROVIDER_SITE_OTHER): Payer: Medicare HMO | Admitting: Family Medicine

## 2022-03-06 ENCOUNTER — Encounter: Payer: Self-pay | Admitting: Family Medicine

## 2022-03-06 VITALS — BP 146/68 | HR 65 | Temp 98.7°F | Resp 16 | Wt 128.8 lb

## 2022-03-06 DIAGNOSIS — H6690 Otitis media, unspecified, unspecified ear: Secondary | ICD-10-CM | POA: Diagnosis not present

## 2022-03-06 DIAGNOSIS — J0100 Acute maxillary sinusitis, unspecified: Secondary | ICD-10-CM

## 2022-03-06 MED ORDER — AMOXICILLIN-POT CLAVULANATE 875-125 MG PO TABS: 1.0000 | ORAL_TABLET | Freq: Two times a day (BID) | ORAL | 0 refills | Status: AC

## 2022-03-06 NOTE — Progress Notes (Signed)
   SUBJECTIVE:   CHIEF COMPLAINT / HPI:   EAR COMPLAINT Duration: few days ago Involved ear(s): R>L Quality:  sharp Fever: no Otorrhea: yes Upper respiratory infection symptoms: yes Hearing loss: no Water immersion no Using Q-tips: no Recurrent otitis media: no Status: worse Treatments attempted: Ear drops for ear ache, tylenol, flonase  OBJECTIVE:   BP (!) 146/68 (BP Location: Left Arm, Cuff Size: Large)   Pulse 65   Temp 98.7 F (37.1 C) (Oral)   Resp 16   Wt 128 lb 12.8 oz (58.4 kg)   BMI 24.34 kg/m   Gen: well appearing, in NAD HEENT: orophyarynx clear without exudate or erythema. Uvula midline. No tonsillar enlargement. Good dentition. TM visible b/l with bulging of R TM. L TM within nomral limits without bulging, erythema, purulence. TTP over maxillary sinuses. Card: RRR Lungs: CTAB Ext: WWP, no edema   ASSESSMENT/PLAN:   AOM  Likely 2/2 sinusitis, bulging seen on exam. Will treat with augmentin x5 days. F/u prn.    Myles Gip, DO

## 2022-03-07 ENCOUNTER — Telehealth: Payer: Self-pay

## 2022-03-07 NOTE — Telephone Encounter (Signed)
Copied from [place] [phone]. Topic: General - Other >> Mar 07, 2022 10:43 AM Jocelyn Harris wrote: Pt states she lost a pair of blue anchor earrings at yesterdays (9-19) vist  If found please call pt

## 2022-03-12 ENCOUNTER — Ambulatory Visit
Admission: RE | Admit: 2022-03-12 | Discharge: 2022-03-12 | Disposition: A | Discharge status: Home / Self Care | Payer: Medicare HMO | Source: Ambulatory Visit | Attending: Family Medicine | Admitting: Family Medicine

## 2022-03-12 DIAGNOSIS — Z1231 Encounter for screening mammogram for malignant neoplasm of breast: Secondary | ICD-10-CM | POA: Insufficient documentation

## 2022-03-13 ENCOUNTER — Other Ambulatory Visit: Payer: Self-pay | Admitting: Family Medicine

## 2022-03-13 DIAGNOSIS — I10 Essential (primary) hypertension: Secondary | ICD-10-CM

## 2022-03-13 DIAGNOSIS — J309 Allergic rhinitis, unspecified: Secondary | ICD-10-CM

## 2022-03-13 NOTE — Telephone Encounter (Signed)
Has refills on both at same pharm. Requested Prescriptions  Pending Prescriptions Disp Refills  . montelukast (SINGULAIR) 10 MG tablet 90 tablet 2    Sig: Take 1 tablet (10 mg total) by mouth at bedtime.     Pulmonology:  Leukotriene Inhibitors Passed - 03/13/2022  4:49 PM      Passed - Valid encounter within last 12 months    Recent Outpatient Visits          1 week ago Acute otitis media, unspecified otitis media type   [place], DO   3 months ago Hypertension, unspecified type   [facility] Jerrol Banana., MD   6 months ago Primary hypertension   [facility] Jerrol Banana., MD   8 months ago Viral upper respiratory tract infection   [facility] Thedore Mins, [place], PA-C   10 months ago Primary hypertension   [facility] Jerrol Banana., MD      Future Appointments            In 2 months Simmons-Robinson, Riki Sheer, MD [facility], [place]           . lisinopril (ZESTRIL) 40 MG tablet 90 tablet 1    Sig: Take 1 tablet (40 mg total) by mouth daily.     Cardiovascular:  ACE Inhibitors Failed - 03/13/2022  4:49 PM      Failed - Cr in normal range and within 180 days    Creatinine, Ser  Date Value Ref Range Status  06/29/2021 0.86 0.57 - 1.00 mg/dL Final         Failed - K in normal range and within 180 days    Potassium  Date Value Ref Range Status  12/22/2020 4.2 3.5 - 5.2 mmol/L Final         Failed - Last BP in normal range    BP Readings from Last 1 Encounters:  03/06/22 (!) 146/68         Passed - Patient is not pregnant      Passed - Valid encounter within last 6 months    Recent Outpatient Visits          1 week ago Acute otitis media, unspecified otitis media type   [place], DO   3 months ago Hypertension, unspecified type   [facility] Jerrol Banana., MD   6 months ago  Primary hypertension   [facility] Jerrol Banana., MD   8 months ago Viral upper respiratory tract infection   [facility] Thedore Mins, [place], PA-C   10 months ago Primary hypertension   [facility] Jerrol Banana., MD      Future Appointments            In 2 months Simmons-Robinson, Riki Sheer, MD [facility], [place]

## 2022-03-13 NOTE — Telephone Encounter (Signed)
Medication Refill - Medication: montelukast (SINGULAIR) 10 MG tablet, lisinopril (ZESTRIL) 40 MG tablet  Has the patient contacted their pharmacy? Yes.     Preferred Pharmacy (with phone number or street name):  [id number], [place] Phone:  [phone]  Fax:  [phone]      Has the patient been seen for an appointment in the last year OR does the patient have an upcoming appointment? Yes.    Please assist patient further

## 2022-03-14 ENCOUNTER — Other Ambulatory Visit: Payer: Self-pay | Admitting: Family Medicine

## 2022-03-14 NOTE — Telephone Encounter (Signed)
Requested medication (s) are due for refill today: no  Requested medication (s) are on the active medication list: no  Last refill:  03/06/22  Future visit scheduled: yes  Notes to clinic:  pt completed course of abx. Requested refill. Please advise     Requested Prescriptions  Pending Prescriptions Disp Refills   amoxicillin-clavulanate (AUGMENTIN) 875-125 MG tablet [Pharmacy Med Name: AMOX-CLAV 875-'125MG'$  TABLETS] 10 tablet 0    Sig: TAKE 1 TABLET BY MOUTH TWICE DAILY FOR 5 DAYS     Off-Protocol Failed - 03/14/2022  8:46 AM      Failed - Medication not assigned to a protocol, review manually.      Passed - Valid encounter within last 12 months    Recent Outpatient Visits           1 week ago Acute otitis media, unspecified otitis media type   [place], DO   3 months ago Hypertension, unspecified type   [facility] Jerrol Banana., MD   6 months ago Primary hypertension   [facility] Jerrol Banana., MD   8 months ago Viral upper respiratory tract infection   [facility] Thedore Mins, [place], PA-C   10 months ago Primary hypertension   [facility] Jerrol Banana., MD       Future Appointments             In 2 months Simmons-Robinson, Riki Sheer, MD [facility], [place]

## 2022-03-15 ENCOUNTER — Ambulatory Visit (INDEPENDENT_AMBULATORY_CARE_PROVIDER_SITE_OTHER): Payer: Medicare HMO | Admitting: Physician Assistant

## 2022-03-15 ENCOUNTER — Encounter: Payer: Self-pay | Admitting: Physician Assistant

## 2022-03-15 VITALS — BP 196/69 | HR 62 | Temp 97.8°F | Ht 61.0 in | Wt 128.9 lb

## 2022-03-15 DIAGNOSIS — I10 Essential (primary) hypertension: Secondary | ICD-10-CM | POA: Diagnosis not present

## 2022-03-15 DIAGNOSIS — J0110 Acute frontal sinusitis, unspecified: Secondary | ICD-10-CM

## 2022-03-15 DIAGNOSIS — M542 Cervicalgia: Secondary | ICD-10-CM

## 2022-03-15 MED ORDER — LEVOFLOXACIN 500 MG PO TABS: 500.0000 mg | ORAL_TABLET | Freq: Every day | ORAL | 0 refills | Status: AC

## 2022-03-15 MED ORDER — HYDROCHLOROTHIAZIDE 12.5 MG PO TABS: 12.5000 mg | ORAL_TABLET | Freq: Every day | ORAL | 3 refills | Status: DC

## 2022-03-15 NOTE — Progress Notes (Signed)
I,[name],acting as a Education administrator for [employer], PA-C.,have documented all relevant documentation on the behalf of Mikey Kirschner, PA-C,as directed by  Mikey Kirschner, PA-C while in the presence of Mikey Kirschner, PA-C.   Established patient visit   Patient: Jocelyn Harris   DOB: 04/06/1951   71 y.o. Female  MRN: [id number] Visit Date: 03/15/2022  Today's healthcare provider: Mikey Kirschner, PA-C   Cc. Sinus congestion/pressure/pain   Subjective    HPI  Pt was seen 9/19 for right ear pain, nasal congestion. Given augmentin 875 mg bid x 5 days for right otitis media. Pt reports her symptoms have not changed-- worsening nasal congestion, pressure, headaches, tooth pain. Reports neck stiffness, denies fevers.  Medications: Outpatient Medications Prior to Visit  Medication Sig   azelastine (ASTELIN) 0.1 % nasal spray Place 2 sprays into both nostrils 2 (two) times daily. Use in each nostril as directed   Biotin 1 MG CAPS Take by mouth.   busPIRone (BUSPAR) 10 MG tablet TAKE 1 TABLET(10 MG) BY MOUTH THREE TIMES DAILY   calcium gluconate 500 MG tablet Take 1 tablet by mouth daily.    cetirizine (ZYRTEC) 10 MG tablet TAKE 1 TABLET(10 MG) BY MOUTH DAILY   Cholecalciferol 1000 UNITS capsule Take 1,000 Units by mouth daily.   cyclobenzaprine (FLEXERIL) 10 MG tablet Take 1 tablet (10 mg total) by mouth at bedtime as needed for muscle spasms.   fluticasone (FLONASE) 50 MCG/ACT nasal spray SHAKE LIQUID AND USE 2 SPRAYS IN EACH NOSTRIL DAILY   gabapentin (NEURONTIN) 100 MG capsule TAKE 1 CAPSULE(100 MG) BY MOUTH THREE TIMES DAILY   lisinopril (ZESTRIL) 40 MG tablet Take 1 tablet (40 mg total) by mouth daily.   Lysine 500 MG TABS Take by mouth as needed.   montelukast (SINGULAIR) 10 MG tablet TAKE 1 TABLET BY MOUTH EVERY NIGHT AT BEDTIME   MULTIPLE VITAMINS PO Take by mouth daily.   Multiple Vitamins-Minerals (HAIR SKIN AND NAILS FORMULA PO) Take by mouth.   Omega-3 Fatty Acids  (FISH OIL) 1000 MG CAPS Take by mouth daily.   omeprazole (PRILOSEC) 40 MG capsule TAKE 1 CAPSULE(40 MG) BY MOUTH IN THE MORNING AND AT BEDTIME   polyethylene glycol powder (GLYCOLAX/MIRALAX) 17 GM/SCOOP powder Take 17 g by mouth daily.   sucralfate (CARAFATE) 1 g tablet Take 1 tablet (1 g total) by mouth 4 (four) times daily.   traZODone (DESYREL) 150 MG tablet TAKE 1 TABLET(150 MG) BY MOUTH AT BEDTIME   vitamin E 100 UNIT capsule Take 100 Units by mouth daily.   [DISCONTINUED] triamterene-hydrochlorothiazide (DYAZIDE) 37.5-25 MG capsule Take 1 each (1 capsule total) by mouth daily. Each morning   No facility-administered medications prior to visit.    Review of Systems  Constitutional:  Positive for fatigue. Negative for fever.  HENT:  Positive for congestion, ear pain, postnasal drip, rhinorrhea, sinus pressure and sinus pain.   Respiratory:  Negative for cough and shortness of breath.   Cardiovascular:  Negative for chest pain and leg swelling.  Gastrointestinal:  Negative for abdominal pain.  Musculoskeletal:  Positive for neck pain.  Neurological:  Negative for dizziness and headaches.      Objective    BP (!) 196/69 (BP Location: Left Arm, Patient Position: Sitting, Cuff Size: Normal)   Pulse 62   Temp 97.8 F (36.6 C) (Oral)   Ht '5\' 1"'$  (1.549 m)   Wt 128 lb 14.4 oz (58.5 kg)   SpO2 100%   BMI 24.36  kg/m  BP Readings from Last 3 Encounters:  03/15/22 (!) 196/69  03/06/22 (!) 146/68  02/15/22 (!) 122/91   Wt Readings from Last 3 Encounters:  03/15/22 128 lb 14.4 oz (58.5 kg)  03/06/22 128 lb 12.8 oz (58.4 kg)  02/15/22 126 lb 9.6 oz (57.4 kg)   Physical Exam Constitutional:      General: She is awake.     Appearance: She is well-developed.  HENT:     Head: Normocephalic.     Right Ear: Tympanic membrane normal.     Left Ear: Tympanic membrane normal.     Nose: Rhinorrhea present. No congestion.     Mouth/Throat:     Pharynx: Posterior oropharyngeal erythema  present. No oropharyngeal exudate.  Eyes:     Conjunctiva/sclera: Conjunctivae normal.  Cardiovascular:     Rate and Rhythm: Normal rate and regular rhythm.     Heart sounds: Normal heart sounds.  Pulmonary:     Effort: Pulmonary effort is normal.     Breath sounds: Normal breath sounds. No stridor. No wheezing, rhonchi or rales.  Musculoskeletal:     Cervical back: Normal range of motion.  Skin:    General: Skin is warm.  Neurological:     Mental Status: She is alert and oriented to person, place, and time.  Psychiatric:        Attention and Perception: Attention normal.        Mood and Affect: Mood normal.        Speech: Speech normal.        Behavior: Behavior is cooperative.     No results found for any visits on 03/15/22.  Assessment & Plan     Acute sinusitis Pt has been without augmentin x 4 days; intolerance to doxy Advised levaquin 500 mg daily x  7 days Increase  fluids, saline nasal spray, rest, tylenol  2. Elevated bp w/ dx of htn Pt was taking maxide but d/c due to frequent urination. Rx hctz 12.5 mg Recommended f/u in office 4 weeks htn and to check at home.   3. Neck pain Tension, no fevers, full rom.  Advised warm compresses  Return in about 4 weeks (around 04/12/2022) for hypertension.      I, Mikey Kirschner, PA-C have reviewed all documentation for this visit. The documentation on  03/15/2022 for the exam, diagnosis, procedures, and orders are all accurate and complete.  Mikey Kirschner, PA-C [facility] [address] [place], [place], [address] Office: [phone] Fax: [place]

## 2022-03-30 ENCOUNTER — Other Ambulatory Visit: Payer: Self-pay | Admitting: Family Medicine

## 2022-03-30 DIAGNOSIS — F32A Depression, unspecified: Secondary | ICD-10-CM

## 2022-03-30 NOTE — Telephone Encounter (Signed)
Requested by interface surescripts. Last refill doc. 03/25/22 Requested Prescriptions  Refused Prescriptions Disp Refills  . traZODone (DESYREL) 150 MG tablet [Pharmacy Med Name: TRAZODONE '150MG'$  (HUNDRED-FIFTY) TAB] 30 tablet 4    Sig: TAKE 1 TABLET(150 MG) BY MOUTH AT BEDTIME     Psychiatry: Antidepressants - Serotonin Modulator Passed - 03/30/2022  8:04 AM      Passed - Completed PHQ-2 or PHQ-9 in the last 360 days      Passed - Valid encounter within last 6 months    Recent Outpatient Visits          2 weeks ago Primary hypertension   [facility] Thedore Mins, [place], PA-C   3 weeks ago Acute otitis media, unspecified otitis media type   [place], DO   4 months ago Hypertension, unspecified type   [facility] Jerrol Banana., MD   7 months ago Primary hypertension   [facility] Jerrol Banana., MD   9 months ago Viral upper respiratory tract infection   [facility] Mikey Kirschner, PA-C      Future Appointments            In 1 week Drubel, Ria Comment, PA-C [facility], [place]   In 1 month Simmons-Robinson, [place], MD [facility], [place]

## 2022-04-12 ENCOUNTER — Ambulatory Visit (INDEPENDENT_AMBULATORY_CARE_PROVIDER_SITE_OTHER): Payer: Medicare HMO | Admitting: Physician Assistant

## 2022-04-12 ENCOUNTER — Encounter: Payer: Self-pay | Admitting: Physician Assistant

## 2022-04-12 VITALS — BP 139/58 | HR 59 | Wt 127.6 lb

## 2022-04-12 DIAGNOSIS — Z23 Encounter for immunization: Secondary | ICD-10-CM

## 2022-04-12 DIAGNOSIS — J0111 Acute recurrent frontal sinusitis: Secondary | ICD-10-CM | POA: Diagnosis not present

## 2022-04-12 DIAGNOSIS — I10 Essential (primary) hypertension: Secondary | ICD-10-CM

## 2022-04-12 MED ORDER — PREDNISONE 20 MG PO TABS: 20.0000 mg | ORAL_TABLET | Freq: Every day | ORAL | 0 refills | Status: DC

## 2022-04-12 NOTE — Assessment & Plan Note (Addendum)
Failed diuretic 2/2 increased urination Now only only on lisinopril 40 mg  Advised pt to check BP daily to see what her average at home is -- if >130/90, advised trying the hctz for at least two weeks to see if she can get used to the increased urination.  If not, we can consider amlodipine.   F/u 8 weeks

## 2022-04-12 NOTE — Progress Notes (Signed)
I,Jocelyn Harris,acting as a Education administrator for [employer], PA-C.,have documented all relevant documentation on the behalf of Jocelyn Kirschner, PA-C,as directed by  Jocelyn Kirschner, PA-C while in the presence of Jocelyn Kirschner, PA-C.   Established patient visit   Patient: Jocelyn Harris   DOB: 09-28-1950   71 y.o. Female  MRN: [id number] Visit Date: 04/12/2022  Today's healthcare provider: Mikey Kirschner, PA-C  Cc. htn  Subjective    HPI  Pt also reports continued sinus pressure, L ear pain. Denies fevers,chills. She is using nasal sprays and taking antihistamines.  Hypertension, follow-up  BP Readings from Last 3 Encounters:  04/12/22 (!) 139/58  03/15/22 (!) 196/69  03/06/22 (!) 146/68   Wt Readings from Last 3 Encounters:  04/12/22 127 lb 9.6 oz (57.9 kg)  03/15/22 128 lb 14.4 oz (58.5 kg)  03/06/22 128 lb 12.8 oz (58.4 kg)     She was last seen for hypertension 4 weeks ago.  BP at that visit was 196/69. Management since that visit includes rx hctz 12.5 mg. Pt reports trying hctz once but was going to the bathroom too often.   She reports fair compliance with treatment. She is not having side effects.  She is following a Low Sodium diet. She is not exercising. She does not smoke.   Outside blood pressures are not being checked.  Symptoms: No chest pain No chest pressure  No palpitations No syncope  No dyspnea No orthopnea  No paroxysmal nocturnal dyspnea Yes lower extremity edema   Pertinent labs Lab Results  Component Value Date   CHOL 235 (H) 01/28/2020   HDL 58 01/28/2020   LDLCALC 148 (H) 01/28/2020   TRIG 161 (H) 01/28/2020   CHOLHDL 4.1 01/28/2020   Lab Results  Component Value Date   NA 141 12/22/2020   K 4.2 12/22/2020   CREATININE 0.86 06/29/2021   EGFR 74 06/29/2021   GLUCOSE 83 12/22/2020   TSH 1.790 12/22/2020     The 10-year ASCVD risk score (Arnett DK, et al., 2019) is:  13.8%  ---------------------------------------------------------------------------------------------------   Medications: Outpatient Medications Prior to Visit  Medication Sig   azelastine (ASTELIN) 0.1 % nasal spray Place 2 sprays into both nostrils 2 (two) times daily. Use in each nostril as directed   Biotin 1 MG CAPS Take by mouth.   busPIRone (BUSPAR) 10 MG tablet TAKE 1 TABLET(10 MG) BY MOUTH THREE TIMES DAILY   calcium gluconate 500 MG tablet Take 1 tablet by mouth daily.    cetirizine (ZYRTEC) 10 MG tablet TAKE 1 TABLET(10 MG) BY MOUTH DAILY   Cholecalciferol 1000 UNITS capsule Take 1,000 Units by mouth daily.   cyclobenzaprine (FLEXERIL) 10 MG tablet Take 1 tablet (10 mg total) by mouth at bedtime as needed for muscle spasms.   fluticasone (FLONASE) 50 MCG/ACT nasal spray SHAKE LIQUID AND USE 2 SPRAYS IN EACH NOSTRIL DAILY   gabapentin (NEURONTIN) 100 MG capsule TAKE 1 CAPSULE(100 MG) BY MOUTH THREE TIMES DAILY   lisinopril (ZESTRIL) 40 MG tablet Take 1 tablet (40 mg total) by mouth daily.   Lysine 500 MG TABS Take by mouth as needed.   montelukast (SINGULAIR) 10 MG tablet TAKE 1 TABLET BY MOUTH EVERY NIGHT AT BEDTIME   MULTIPLE VITAMINS PO Take by mouth daily.   Multiple Vitamins-Minerals (HAIR SKIN AND NAILS FORMULA PO) Take by mouth.   Omega-3 Fatty Acids (FISH OIL) 1000 MG CAPS Take by mouth daily.   omeprazole (PRILOSEC) 40 MG capsule TAKE 1  CAPSULE(40 MG) BY MOUTH IN THE MORNING AND AT BEDTIME   polyethylene glycol powder (GLYCOLAX/MIRALAX) 17 GM/SCOOP powder Take 17 g by mouth daily.   sucralfate (CARAFATE) 1 g tablet Take 1 tablet (1 g total) by mouth 4 (four) times daily.   traZODone (DESYREL) 150 MG tablet TAKE 1 TABLET(150 MG) BY MOUTH AT BEDTIME   vitamin E 100 UNIT capsule Take 100 Units by mouth daily.   [DISCONTINUED] hydrochlorothiazide (HYDRODIURIL) 12.5 MG tablet Take 1 tablet (12.5 mg total) by mouth daily. (Patient not taking: Reported on 04/12/2022)   No  facility-administered medications prior to visit.    Review of Systems  Constitutional:  Negative for fatigue and fever.  HENT:  Positive for ear pain and sinus pressure.   Respiratory:  Negative for cough and shortness of breath.   Cardiovascular:  Negative for chest pain and leg swelling.  Gastrointestinal:  Negative for abdominal pain.  Neurological:  Negative for dizziness and headaches.      Objective    Blood pressure (!) 139/58, pulse (!) 59, weight 127 lb 9.6 oz (57.9 kg).   Physical Exam Constitutional:      General: She is awake.     Appearance: She is well-developed.  HENT:     Head: Normocephalic.     Right Ear: Tympanic membrane normal.     Left Ear: Tympanic membrane normal.  Eyes:     Conjunctiva/sclera: Conjunctivae normal.  Cardiovascular:     Rate and Rhythm: Normal rate and regular rhythm.     Heart sounds: Normal heart sounds.  Pulmonary:     Effort: Pulmonary effort is normal.  Skin:    General: Skin is warm.  Neurological:     Mental Status: She is alert and oriented to person, place, and time.  Psychiatric:        Attention and Perception: Attention normal.        Mood and Affect: Mood normal.        Speech: Speech normal.        Behavior: Behavior is cooperative.     No results found for any visits on 04/12/22.  Assessment & Plan     Problem List Items Addressed This Visit       Cardiovascular and Mediastinum   Hypertension - Primary    Failed diuretic 2/2 increased urination Now only only on lisinopril 40 mg  Advised pt to check BP daily to see what her average at home is -- if >130/90, advised trying the hctz for at least two weeks to see if she can get used to the increased urination.  If not, we can consider amlodipine.   F/u 8 weeks         Respiratory   Acute recurrent frontal sinusitis    May be allergic in etiology Advised trying prednisone burst, 20 mg x 5 days At this point no suppurative infection is present       Relevant Medications   predniSONE (DELTASONE) 20 MG tablet   Other Visit Diagnoses     Need for immunization against influenza       Relevant Orders   Flu Vaccine QUAD High Dose(Fluad)   Need for pneumococcal vaccine       Relevant Orders   Pneumococcal conjugate vaccine 20-valent (Prevnar 20)       Return in about 8 weeks (around 06/07/2022) for hypertension.      I, Jocelyn Kirschner, PA-C have reviewed all documentation for this visit. The documentation on  04/12/2022  for the exam, diagnosis, procedures, and orders are all accurate and complete.  Jocelyn Kirschner, PA-C [facility] [address] [place], [place], [address] Office: [phone] Fax: [place]

## 2022-04-12 NOTE — Assessment & Plan Note (Signed)
May be allergic in etiology Advised trying prednisone burst, 20 mg x 5 days At this point no suppurative infection is present

## 2022-04-23 ENCOUNTER — Encounter: Payer: Self-pay | Admitting: Family Medicine

## 2022-04-23 ENCOUNTER — Ambulatory Visit (INDEPENDENT_AMBULATORY_CARE_PROVIDER_SITE_OTHER): Payer: Medicare HMO | Admitting: Family Medicine

## 2022-04-23 VITALS — BP 132/67 | HR 59 | Temp 98.0°F | Resp 14 | Wt 128.0 lb

## 2022-04-23 DIAGNOSIS — J3089 Other allergic rhinitis: Secondary | ICD-10-CM

## 2022-04-23 DIAGNOSIS — J0111 Acute recurrent frontal sinusitis: Secondary | ICD-10-CM | POA: Diagnosis not present

## 2022-04-23 MED ORDER — PREDNISONE 5 MG PO TABS: ORAL_TABLET | ORAL | 0 refills | Status: AC

## 2022-04-23 MED ORDER — AZITHROMYCIN 250 MG PO TABS: ORAL_TABLET | ORAL | 0 refills | Status: AC

## 2022-04-23 MED ORDER — PREDNISONE 10 MG PO TABS: ORAL_TABLET | ORAL | 0 refills | Status: DC

## 2022-04-23 NOTE — Progress Notes (Signed)
I,Roshena L Chambers,acting as a scribe for Lelon Huh, MD.,have documented all relevant documentation on the behalf of Lelon Huh, MD,as directed by  Lelon Huh, MD while in the presence of Lelon Huh, MD.   Established patient visit   Patient: Jocelyn Harris   DOB: April 24, 1951   71 y.o. Female  MRN: [id number] Visit Date: 04/23/2022  Today's healthcare provider: Lelon Huh, MD   Chief Complaint  Patient presents with   Sinus Problem   Subjective    Sinus Problem Associated symptoms include congestion, headaches and sinus pressure. Pertinent negatives include no chills or shortness of breath.  She was last seen on 04/12/2022 by Mikey Kirschner, PA-C for recurrent frontal sinusitis. During that visit she was prescribed a prednisone burst, 20 mg x 5 days.Patient completed all doses of medication. Her symptoms improved, but she still has sinus pressure. He also reports having elevated blood pressure readings at home (150/80's).  Initially seen Dr. Ky Barban 03/06/2022 and prescribed Augmentin, never resolved. Seen Ria Comment on 10/26 and prescribed levofloxacin with minimal improvement. Seen 10/26 and prescribed prednisone '20mg'$  x 5 days which did help, but symptoms returned as soon as she finished and did have a little bit of insomnia. She has been using steroid nasal spray consistently the last few months.   Medications: Outpatient Medications Prior to Visit  Medication Sig   azelastine (ASTELIN) 0.1 % nasal spray Place 2 sprays into both nostrils 2 (two) times daily. Use in each nostril as directed   Biotin 1 MG CAPS Take by mouth.   busPIRone (BUSPAR) 10 MG tablet TAKE 1 TABLET(10 MG) BY MOUTH THREE TIMES DAILY   calcium gluconate 500 MG tablet Take 1 tablet by mouth daily.    cetirizine (ZYRTEC) 10 MG tablet TAKE 1 TABLET(10 MG) BY MOUTH DAILY   Cholecalciferol 1000 UNITS capsule Take 1,000 Units by mouth daily.   cyclobenzaprine (FLEXERIL) 10 MG tablet Take 1 tablet  (10 mg total) by mouth at bedtime as needed for muscle spasms.   fluticasone (FLONASE) 50 MCG/ACT nasal spray SHAKE LIQUID AND USE 2 SPRAYS IN EACH NOSTRIL DAILY   gabapentin (NEURONTIN) 100 MG capsule TAKE 1 CAPSULE(100 MG) BY MOUTH THREE TIMES DAILY   lisinopril (ZESTRIL) 40 MG tablet Take 1 tablet (40 mg total) by mouth daily.   Lysine 500 MG TABS Take by mouth as needed.   montelukast (SINGULAIR) 10 MG tablet TAKE 1 TABLET BY MOUTH EVERY NIGHT AT BEDTIME   MULTIPLE VITAMINS PO Take by mouth daily.   Multiple Vitamins-Minerals (HAIR SKIN AND NAILS FORMULA PO) Take by mouth.   Omega-3 Fatty Acids (FISH OIL) 1000 MG CAPS Take by mouth daily.   omeprazole (PRILOSEC) 40 MG capsule TAKE 1 CAPSULE(40 MG) BY MOUTH IN THE MORNING AND AT BEDTIME   polyethylene glycol powder (GLYCOLAX/MIRALAX) 17 GM/SCOOP powder Take 17 g by mouth daily.   predniSONE (DELTASONE) 20 MG tablet Take 1 tablet (20 mg total) by mouth daily with breakfast.   sucralfate (CARAFATE) 1 g tablet Take 1 tablet (1 g total) by mouth 4 (four) times daily.   traZODone (DESYREL) 150 MG tablet TAKE 1 TABLET(150 MG) BY MOUTH AT BEDTIME   vitamin E 100 UNIT capsule Take 100 Units by mouth daily.   No facility-administered medications prior to visit.    Review of Systems  Constitutional:  Negative for appetite change, chills, fatigue and fever.  HENT:  Positive for congestion, sinus pressure and sinus pain.   Respiratory:  Negative for  chest tightness and shortness of breath.   Cardiovascular:  Negative for chest pain and palpitations.  Gastrointestinal:  Negative for abdominal pain, nausea and vomiting.  Neurological:  Positive for headaches. Negative for dizziness and weakness.       Objective    BP 132/67 (BP Location: Left Arm, Patient Position: Sitting, Cuff Size: Normal)   Pulse (!) 59   Temp 98 F (36.7 C) (Oral)   Resp 14   Wt 128 lb (58.1 kg)   SpO2 100% Comment: room air  BMI 24.19 kg/m    Today's Vitals    04/23/22 1616 04/23/22 1619  BP: (!) 144/68 132/67  Pulse: (!) 58 (!) 59  Resp: 14   Temp: 98 F (36.7 C)   TempSrc: Oral   SpO2: 100%   Weight: 128 lb (58.1 kg)    Body mass index is 24.19 kg/m.   Physical Exam  General Appearance:    Well developed, well nourished female, alert, cooperative, in no acute distress  HENT:   neck has left anterior cervical nodes enlarged, frontal sinus tenderness, L>R, post nasal drip noted, and nasal mucosa congested  Eyes:    PERRL, conjunctiva/corneas clear, EOM's intact       Lungs:     Clear to auscultation bilaterally, respirations unlabored  Heart:    Bradycardic. Normal rhythm. No murmurs, rubs, or gallops.    Neurologic:   Awake, alert, oriented x 3. No apparent focal neurological           defect.         Assessment & Plan     1. Seasonal allergic rhinitis due to other allergic trigger Had brief improvement with short course of prednisone prescribed 04-12-2022 - predniSONE (DELTASONE) 5 MG tablet; 4 tablets for 4 days, then 3 for 4 days, then 2 for 4 days, then 1 for 4 days  Dispense: 40 tablet; Refill: 0  2. Acute recurrent frontal sinusitis She does have left sided LAD suggestive of infectious process.  - azithromycin (ZITHROMAX) 250 MG tablet; Take 2 tablets on day 1, then 1 tablet daily on days 2 through 5  Dispense: 6 tablet; Refill: 0  Will refer to ENT if symptoms recur or do not improve.     The entirety of the information documented in the History of Present Illness, Review of Systems and Physical Exam were personally obtained by me. Portions of this information were initially documented by the CMA and reviewed by me for thoroughness and accuracy.     Lelon Huh, MD  [facility] [phone] (phone) [phone] (fax)  [place]

## 2022-04-30 ENCOUNTER — Telehealth: Payer: Self-pay | Admitting: Family Medicine

## 2022-04-30 DIAGNOSIS — J3089 Other allergic rhinitis: Secondary | ICD-10-CM

## 2022-04-30 MED ORDER — CETIRIZINE HCL 10 MG PO TABS: ORAL_TABLET | ORAL | 3 refills | Status: AC

## 2022-04-30 NOTE — Telephone Encounter (Signed)
[facility] faxed refill request for the following medications:   cetirizine (ZYRTEC) 10 MG tablet  Please advise

## 2022-05-11 ENCOUNTER — Ambulatory Visit: Payer: Self-pay | Admitting: *Deleted

## 2022-05-11 DIAGNOSIS — K120 Recurrent oral aphthae: Secondary | ICD-10-CM | POA: Diagnosis not present

## 2022-05-11 DIAGNOSIS — K1120 Sialoadenitis, unspecified: Secondary | ICD-10-CM | POA: Diagnosis not present

## 2022-05-11 NOTE — Telephone Encounter (Signed)
Reason for Disposition  Toothache    Had temporary crown put on 2 wks ago  Answer Assessment - Initial Assessment Questions 1. ONSET: "When did the swelling start?" (e.g., minutes, hours, days)     Facial swelling on right side of face.   Having a headache.    The last few days it's hurting more and swelling under my jaw.   I don't feel good.   I tried to contact my dentist they are closed for the Thanksgiving holiday.     2. LOCATION: "What part of the face is swollen?"     Right face and lips. A couple of days ago I noticed.    Under my lips I have sores that burn with the toothpaste.  I had a temporary crown put on 2 weeks ago.    3. SEVERITY: "How swollen is it?"     My face is so sore.   I'm at work now.   4. ITCHING: "Is there any itching?" If Yes, ask: "How much?"   (Scale 1-10; mild, moderate or severe)     No  5. PAIN: "Is the swelling painful to touch?" If Yes, ask: "How painful is it?"   (Scale 1-10; mild, moderate or severe)   - NONE (0): no pain   - MILD (1-3): doesn't interfere with normal activities    - MODERATE (4-7): interferes with normal activities or awakens from sleep    - SEVERE (8-10): excruciating pain, unable to do any normal activities      Moderate 6. FEVER: "Do you have a fever?" If Yes, ask: "What is it, how was it measured, and when did it start?"      Maybe 7. CAUSE: "What do you think is causing the face swelling?"     I'm not sure.   I had a temporary crown put on 2 weeks ago.   I don't know if it's related to that or not.   My dentist's office is closed today. 8. RECURRENT SYMPTOM: "Have you had face swelling before?" If Yes, ask: "When was the last time?" "What happened that time?"     No 9. OTHER SYMPTOMS: "Do you have any other symptoms?" (e.g., toothache, leg swelling)     Headache, right side of face is swollen and very tender to touch and the sores inside my lower lip on right side burn when I brush my teeth and the toothpaste comes in contact  with them. 10. PREGNANCY: "Is there any chance you are pregnant?" "When was your last menstrual period?"       Not asked  Protocols used: Face Swelling-A-AH  Chief Complaint: Right sided facial swelling with pain around jaw and under chin.  Very tender to touch. Symptoms: Sore inside lower lips on right side.  Lips are swollen too on right side.   Had a temporary crown put on 2 wks ago.   Dentist office is closed. Frequency: For last 3 days Pertinent Negatives: Patient denies knowing the  cause Disposition: '[]'$ ED /'[x]'$ Urgent Care (no appt availability in office) / '[]'$ Appointment(In office/virtual)/ '[]'$  [facility]/ '[]'$ Home Care/ '[]'$ Refused Recommended Disposition /'[]'$ [facility]/ '[]'$  Follow-up with PCP Additional Notes: The practice is closed for the Thanksgiving holiday so I have referred her to the urgent care since her dentist is closed today too.   Pt. Agreeable to this plan.

## 2022-05-15 DIAGNOSIS — K120 Recurrent oral aphthae: Secondary | ICD-10-CM | POA: Diagnosis not present

## 2022-05-16 ENCOUNTER — Ambulatory Visit: Payer: Self-pay | Admitting: *Deleted

## 2022-05-16 ENCOUNTER — Encounter: Payer: Self-pay | Admitting: Physician Assistant

## 2022-05-16 ENCOUNTER — Other Ambulatory Visit: Payer: Self-pay | Admitting: Physician Assistant

## 2022-05-16 ENCOUNTER — Ambulatory Visit (INDEPENDENT_AMBULATORY_CARE_PROVIDER_SITE_OTHER): Payer: Medicare HMO | Admitting: Physician Assistant

## 2022-05-16 ENCOUNTER — Telehealth: Payer: Self-pay | Admitting: Family Medicine

## 2022-05-16 VITALS — BP 149/73 | HR 67 | Temp 98.1°F | Resp 16 | Ht 61.0 in | Wt 132.0 lb

## 2022-05-16 DIAGNOSIS — I10 Essential (primary) hypertension: Secondary | ICD-10-CM

## 2022-05-16 DIAGNOSIS — K219 Gastro-esophageal reflux disease without esophagitis: Secondary | ICD-10-CM

## 2022-05-16 MED ORDER — OMEPRAZOLE 40 MG PO CPDR: DELAYED_RELEASE_CAPSULE | ORAL | 2 refills | Status: DC

## 2022-05-16 MED ORDER — HYDROCHLOROTHIAZIDE 25 MG PO TABS: 25.0000 mg | ORAL_TABLET | Freq: Every day | ORAL | 0 refills | Status: DC

## 2022-05-16 NOTE — Telephone Encounter (Signed)
  Chief Complaint: elevated BP , cold sx Symptoms: elevated BP 211/71, at [facility] last Friday took prednisone and augmentin for mouth ulcers and swollen glands. Better now . C/o headache, chills/hot flashes, mild runny nose, dry cough, fatigue since taking medication. Has not tested for covid.  Frequency: 11/26 or 27/23 Pertinent Negatives: Patient denies chest pain no difficulty breathing , no weakness on either side of body. No blurred vision no speech issues.  Disposition: '[]'$ ED /'[]'$ Urgent Care (no appt availability in office) / '[x]'$ Appointment(In office/virtual)/ '[]'$  [facility] [place] [facility]/ '[]'$ Home Care/ '[]'$ Refused Recommended Disposition /'[]'$ [facility]/ '[]'$  Follow-up with PCP Additional Notes:   Appt today .   Reason for Disposition  Systolic BP  >= 656 OR Diastolic >= 812  Answer Assessment - Initial Assessment Questions 1. BLOOD PRESSURE: "What is the blood pressure?" "Did you take at least two measurements 5 minutes apart?"     BP 211/71 at [facility] last Friday  2. ONSET: "When did you take your blood pressure?"     Was taken at [place] per patient 3. HOW: "How did you take your blood pressure?" (e.g., automatic home BP monitor, visiting nurse)     Nurse  4. HISTORY: "Do you have a history of high blood pressure?"     Yes  5. MEDICINES: "Are you taking any medicines for blood pressure?" "Have you missed any doses recently?"     Prednisone , augmentin since last Friday for ulcer in mouth and swollen glands  6. OTHER SYMPTOMS: "Do you have any symptoms?" (e.g., blurred vision, chest pain, difficulty breathing, headache, weakness)     Headache, fatigue runny nose dry cough , chills / hot flashes  05/13/22 7. PREGNANCY: "Is there any chance you are pregnant?" "When was your last menstrual period?"     na  Protocols used: Blood Pressure - High-A-AH

## 2022-05-16 NOTE — Telephone Encounter (Signed)
[facility] faxed refill request for the following medications:   omeprazole (PRILOSEC) 40 MG capsule     Please advise

## 2022-05-16 NOTE — Telephone Encounter (Signed)
Requested medication (s) are due for refill today: signed today #30  Requested medication (s) are on the active medication list: yes  Last refill:  05/16/22 #30 0 refills  Future visit scheduled: yes in 1 week   Notes to clinic:  requesting 90 day supply . Do you want to refill for #90 0 refills?     Requested Prescriptions  Pending Prescriptions Disp Refills   hydrochlorothiazide (HYDRODIURIL) 25 MG tablet [Pharmacy Med Name: HYDROCHLOROTHIAZIDE '25MG'$  TABLETS] 90 tablet     Sig: TAKE 1 TABLET(25 MG) BY MOUTH DAILY     Cardiovascular: Diuretics - Thiazide Failed - 05/16/2022  4:27 PM      Failed - Cr in normal range and within 180 days    Creatinine, Ser  Date Value Ref Range Status  06/29/2021 0.86 0.57 - 1.00 mg/dL Final         Failed - K in normal range and within 180 days    Potassium  Date Value Ref Range Status  12/22/2020 4.2 3.5 - 5.2 mmol/L Final         Failed - Na in normal range and within 180 days    Sodium  Date Value Ref Range Status  12/22/2020 141 134 - 144 mmol/L Final         Failed - Last BP in normal range    BP Readings from Last 1 Encounters:  05/16/22 (!) 149/73         Passed - Valid encounter within last 6 months    Recent Outpatient Visits           Today Hypertension, unspecified type   [facility] [place], [place], PA-C   3 weeks ago Seasonal allergic rhinitis due to other allergic trigger   [facility] Birdie Sons, MD   1 month ago Primary hypertension   [facility] Mikey Kirschner, PA-C   2 months ago Primary hypertension   [facility] Thedore Mins, [place], PA-C   2 months ago Acute otitis media, unspecified otitis media type   [place], Jake Church, DO       Future Appointments             In 1 week Simmons-Robinson, Riki Sheer, MD [facility], [place]

## 2022-05-16 NOTE — Progress Notes (Signed)
I,Tiffany J Bragg,acting as a Education administrator for [employer], PA-C.,have documented all relevant documentation on the behalf of Mardene Speak, PA-C,as directed by  [employer], PA-C while in the presence of [employer], PA-C.   Established patient visit   Patient: Jocelyn Harris   DOB: 11/02/1950   71 y.o. Female  MRN: [id number] Visit Date: 05/16/2022  Today's healthcare provider: Mardene Speak, PA-C   Chief Complaint  Patient presents with   Hypertension   Subjective    HPI HPI   Patient complains of elevated blood pressure, chills, headache after starting prednisone 5 days ago. Took last pill yesterday morning. Was also on augmentin for swollen glands, but did not finish the course.  Last edited by Smitty Knudsen, CMA on 05/16/2022  3:44 PM.      Pt associated all symptoms with taking prednisone. Pt noticed that her systolic BP increased to 106 around 4-5 pm yesterday.  [facility] and was advised to d/c prednisone and see her PCP for FU  Medications: Outpatient Medications Prior to Visit  Medication Sig   azelastine (ASTELIN) 0.1 % nasal spray Place 2 sprays into both nostrils 2 (two) times daily. Use in each nostril as directed   Biotin 1 MG CAPS Take by mouth.   busPIRone (BUSPAR) 10 MG tablet TAKE 1 TABLET(10 MG) BY MOUTH THREE TIMES DAILY   calcium gluconate 500 MG tablet Take 1 tablet by mouth daily.    cetirizine (ZYRTEC) 10 MG tablet TAKE 1 TABLET(10 MG) BY MOUTH DAILY   Cholecalciferol 1000 UNITS capsule Take 1,000 Units by mouth daily.   cyclobenzaprine (FLEXERIL) 10 MG tablet Take 1 tablet (10 mg total) by mouth at bedtime as needed for muscle spasms.   fluticasone (FLONASE) 50 MCG/ACT nasal spray SHAKE LIQUID AND USE 2 SPRAYS IN EACH NOSTRIL DAILY   gabapentin (NEURONTIN) 100 MG capsule TAKE 1 CAPSULE(100 MG) BY MOUTH THREE TIMES DAILY   lisinopril (ZESTRIL) 40 MG tablet Take 1 tablet (40 mg total) by mouth daily.   Lysine 500 MG TABS Take by  mouth as needed.   montelukast (SINGULAIR) 10 MG tablet TAKE 1 TABLET BY MOUTH EVERY NIGHT AT BEDTIME   MULTIPLE VITAMINS PO Take by mouth daily.   Multiple Vitamins-Minerals (HAIR SKIN AND NAILS FORMULA PO) Take by mouth.   Omega-3 Fatty Acids (FISH OIL) 1000 MG CAPS Take by mouth daily.   omeprazole (PRILOSEC) 40 MG capsule TAKE 1 CAPSULE(40 MG) BY MOUTH IN THE MORNING AND AT BEDTIME   polyethylene glycol powder (GLYCOLAX/MIRALAX) 17 GM/SCOOP powder Take 17 g by mouth daily.   sucralfate (CARAFATE) 1 g tablet Take 1 tablet (1 g total) by mouth 4 (four) times daily.   traZODone (DESYREL) 150 MG tablet TAKE 1 TABLET(150 MG) BY MOUTH AT BEDTIME   vitamin E 100 UNIT capsule Take 100 Units by mouth daily.   No facility-administered medications prior to visit.    Review of Systems  Cardiovascular:  Negative for chest pain, palpitations and leg swelling.  All other systems reviewed and are negative. Except see HPI     Objective    BP (!) 149/73 (BP Location: Right Arm, Patient Position: Sitting, Cuff Size: Normal)   Pulse 67   Temp 98.1 F (36.7 C) (Oral)   Resp 16   Ht '5\' 1"'$  (1.549 m)   Wt 132 lb (59.9 kg)   SpO2 98%   BMI 24.94 kg/m    Physical Exam Vitals reviewed.  Constitutional:      General: She is not in acute distress.    Appearance: Normal appearance. She is well-developed. She is not diaphoretic.  HENT:     Head: Normocephalic and atraumatic.  Eyes:     General: No scleral icterus.       Right eye: No discharge.        Left eye: No discharge.     Extraocular Movements: Extraocular movements intact.     Conjunctiva/sclera: Conjunctivae normal.     Pupils: Pupils are equal, round, and reactive to light.  Neck:     Thyroid: No thyromegaly.  Cardiovascular:     Rate and Rhythm: Normal rate and regular rhythm.     Pulses: Normal pulses.     Heart sounds: Normal heart sounds. No murmur heard. Pulmonary:     Effort: Pulmonary effort is normal. No respiratory  distress.     Breath sounds: Normal breath sounds. No wheezing, rhonchi or rales.  Abdominal:     General: Abdomen is flat. Bowel sounds are normal.     Palpations: Abdomen is soft.  Musculoskeletal:        General: Normal range of motion.     Cervical back: Normal range of motion and neck supple.     Right lower leg: No edema.     Left lower leg: No edema.  Lymphadenopathy:     Cervical: No cervical adenopathy.  Skin:    General: Skin is warm and dry.     Findings: No rash.  Neurological:     Mental Status: She is alert and oriented to person, place, and time. Mental status is at baseline.  Psychiatric:        Behavior: Behavior normal.        Thought Content: Thought content normal.        Judgment: Judgment normal.      No results found for any visits on 05/16/22.  Assessment & Plan     1. Hypertension, unspecified type Acute problem Could be 2/2 taking prednisone Advised stop taking prednisone Continue taking BP medication: lisinopril 40 mg Start diuretic HCTZ '25mg'$  for prednisone-induced BP control  Continue to measure her BP daily for monitoring CMP from was WNL - hydrochlorothiazide (HYDRODIURIL) 25 MG tablet; Take 1 tablet (25 mg total) by mouth daily.  Dispense: 30 tablet; Refill: 0  Continue taking omega-3 for cholesterol control  FU with Dr. Quentin Cornwall next week.  The patient was advised to call back or seek an in-person evaluation if the symptoms worsen or if the condition fails to improve as anticipated.  I discussed the assessment and treatment plan with the patient. The patient was provided an opportunity to ask questions and all were answered. The patient agreed with the plan and demonstrated an understanding of the instructions.  The entirety of the information documented in the History of Present Illness, Review of Systems and Physical Exam were personally obtained by me. Portions of this information were initially documented by the CMA and reviewed by me  for thoroughness and accuracy.   Mardene Speak, [facility], [place] [phone] (phone) [phone] (fax)

## 2022-05-17 ENCOUNTER — Other Ambulatory Visit: Payer: Self-pay | Admitting: Family Medicine

## 2022-05-17 DIAGNOSIS — F32A Depression, unspecified: Secondary | ICD-10-CM

## 2022-05-17 MED ORDER — TRAZODONE HCL 150 MG PO TABS: ORAL_TABLET | ORAL | 0 refills | Status: DC

## 2022-05-17 NOTE — Telephone Encounter (Signed)
Requested Prescriptions  Pending Prescriptions Disp Refills   traZODone (DESYREL) 150 MG tablet 90 tablet 0    Sig: TAKE 1 TABLET(150 MG) BY MOUTH AT BEDTIME     Psychiatry: Antidepressants - Serotonin Modulator Passed - 05/17/2022 10:17 AM      Passed - Completed PHQ-2 or PHQ-9 in the last 360 days      Passed - Valid encounter within last 6 months    Recent Outpatient Visits           Yesterday Hypertension, unspecified type   [facility] [place], [place], PA-C   3 weeks ago Seasonal allergic rhinitis due to other allergic trigger   [facility] Birdie Sons, MD   1 month ago Primary hypertension   [facility] Mikey Kirschner, PA-C   2 months ago Primary hypertension   [facility] Thedore Mins, [place], PA-C   2 months ago Acute otitis media, unspecified otitis media type   [place], Jake Church, DO       Future Appointments             In 1 week Simmons-Robinson, Riki Sheer, MD [facility], [place]

## 2022-05-17 NOTE — Telephone Encounter (Signed)
Medication Refill - Medication: traZODone (DESYREL) 150 MG tablet   Has the patient contacted their pharmacy? Yes.    (Agent: If yes, when and what did the pharmacy advise?) have no received a response, call office  Preferred Pharmacy (with phone number or street name):  [id number] [address], [place] [address] Phone: [phone]  Fax: [phone]     Has the patient been seen for an appointment in the last year OR does the patient have an upcoming appointment? Yes.    Agent: Please be advised that RX refills may take up to 3 business days. We ask that you follow-up with your pharmacy.

## 2022-05-24 ENCOUNTER — Encounter: Payer: Self-pay | Admitting: Family Medicine

## 2022-05-24 ENCOUNTER — Ambulatory Visit: Payer: Medicare HMO | Admitting: Family Medicine

## 2022-05-24 ENCOUNTER — Ambulatory Visit (INDEPENDENT_AMBULATORY_CARE_PROVIDER_SITE_OTHER): Payer: Medicare HMO | Admitting: Family Medicine

## 2022-05-24 VITALS — BP 132/75 | HR 62 | Temp 97.9°F | Ht 61.0 in | Wt 129.0 lb

## 2022-05-24 DIAGNOSIS — J3089 Other allergic rhinitis: Secondary | ICD-10-CM

## 2022-05-24 DIAGNOSIS — H6992 Unspecified Eustachian tube disorder, left ear: Secondary | ICD-10-CM

## 2022-05-24 DIAGNOSIS — I10 Essential (primary) hypertension: Secondary | ICD-10-CM

## 2022-05-24 DIAGNOSIS — R5383 Other fatigue: Secondary | ICD-10-CM | POA: Diagnosis not present

## 2022-05-24 DIAGNOSIS — F32A Depression, unspecified: Secondary | ICD-10-CM | POA: Diagnosis not present

## 2022-05-24 DIAGNOSIS — E785 Hyperlipidemia, unspecified: Secondary | ICD-10-CM

## 2022-05-24 DIAGNOSIS — E559 Vitamin D deficiency, unspecified: Secondary | ICD-10-CM | POA: Diagnosis not present

## 2022-05-24 DIAGNOSIS — F3341 Major depressive disorder, recurrent, in partial remission: Secondary | ICD-10-CM

## 2022-05-24 DIAGNOSIS — F419 Anxiety disorder, unspecified: Secondary | ICD-10-CM | POA: Diagnosis not present

## 2022-05-24 MED ORDER — FLUTICASONE PROPIONATE 50 MCG/ACT NA SUSP: NASAL | 2 refills | Status: DC

## 2022-05-24 MED ORDER — TRAZODONE HCL 150 MG PO TABS: ORAL_TABLET | ORAL | 0 refills | Status: DC

## 2022-05-24 NOTE — Assessment & Plan Note (Signed)
Reviewed last lipid panel Not currently on a statin Recheck FLP and CMP Discussed diet and exercise  

## 2022-05-24 NOTE — Assessment & Plan Note (Signed)
Now with some eustachian tube dysfunction Resume Flonase daily

## 2022-05-24 NOTE — Assessment & Plan Note (Signed)
Longstanding and stable No depression symptoms currently Not on SSRI or SNRI

## 2022-05-24 NOTE — Assessment & Plan Note (Signed)
Chronic and well-controlled Continue BuSpar at current dose

## 2022-05-24 NOTE — Assessment & Plan Note (Signed)
Well controlled Continue current medications Recheck metabolic panel 

## 2022-05-24 NOTE — Assessment & Plan Note (Signed)
New problem Normal ear exam, but having ear pain in the setting of sinus congestion Encouraged regular use of Flonase

## 2022-05-24 NOTE — Progress Notes (Signed)
I,Sulibeya S Dimas,acting as a Education administrator for Lavon Paganini, MD.,have documented all relevant documentation on the behalf of Lavon Paganini, MD,as directed by  Lavon Paganini, MD while in the presence of Lavon Paganini, MD.     Established patient visit   Patient: Jocelyn Harris   DOB: 02/17/1951   71 y.o. Female  MRN: [id number] Visit Date: 05/24/2022  Today's healthcare provider: Lavon Paganini, MD   Chief Complaint  Patient presents with   Hypertension   Subjective    HPI  Follow up for Hypertension  The patient was last seen for this 1 weeks ago. Changes made at last visit include D/C prednisone. Start HCTZ 25 mg and continue lisinopril 40 mg daily.  She reports poor compliance with treatment. Patient did not start HCTZ.  She feels that condition is Improved. She is not having side effects.   BP Readings from Last 3 Encounters:  05/24/22 132/75  05/16/22 (!) 149/73  04/23/22 132/67  ----------------------------------------------------------------------------------------- Depression/anxiety, Follow-up  She  was last seen for this 6 months ago. Changes made at last visit include no changes.   She reports excellent compliance with treatment. She is not having side effects.   She reports excellent tolerance of treatment. Current symptoms include: fatigue She feels she is Improved since last visit.     05/24/2022   10:51 AM 04/12/2022    1:12 PM 01/03/2022    3:37 PM  Depression screen PHQ 2/9  Decreased Interest 0 0 0  Down, Depressed, Hopeless 0 0 0  PHQ - 2 Score 0 0 0  Altered sleeping 0 0 0  Tired, decreased energy 0 0 0  Change in appetite 0 0 0  Feeling bad or failure about yourself  0 0 0  Trouble concentrating 0 0 0  Moving slowly or fidgety/restless 0 0 0  Suicidal thoughts 0 0 0  PHQ-9 Score 0 0 0  Difficult doing work/chores Not difficult at all Not difficult at all Not difficult at all       05/24/2022   10:52 AM  GAD 7 :  Generalized Anxiety Score  Nervous, Anxious, on Edge 0  Control/stop worrying 0  Worry too much - different things 0  Trouble relaxing 0  Restless 0  Easily annoyed or irritable 0  Afraid - awful might happen 0  Total GAD 7 Score 0  Anxiety Difficulty Not difficult at all     -----------------------------------------------------------------------------------------  Medications: Outpatient Medications Prior to Visit  Medication Sig   azelastine (ASTELIN) 0.1 % nasal spray Place 2 sprays into both nostrils 2 (two) times daily. Use in each nostril as directed   Biotin 1 MG CAPS Take by mouth.   busPIRone (BUSPAR) 10 MG tablet TAKE 1 TABLET(10 MG) BY MOUTH THREE TIMES DAILY   calcium gluconate 500 MG tablet Take 1 tablet by mouth daily.    cetirizine (ZYRTEC) 10 MG tablet TAKE 1 TABLET(10 MG) BY MOUTH DAILY   Cholecalciferol 1000 UNITS capsule Take 1,000 Units by mouth daily.   cyclobenzaprine (FLEXERIL) 10 MG tablet Take 1 tablet (10 mg total) by mouth at bedtime as needed for muscle spasms.   gabapentin (NEURONTIN) 100 MG capsule TAKE 1 CAPSULE(100 MG) BY MOUTH THREE TIMES DAILY   lisinopril (ZESTRIL) 40 MG tablet Take 1 tablet (40 mg total) by mouth daily.   Lysine 500 MG TABS Take by mouth as needed.   montelukast (SINGULAIR) 10 MG tablet TAKE 1 TABLET BY MOUTH EVERY NIGHT AT BEDTIME  MULTIPLE VITAMINS PO Take by mouth daily.   Multiple Vitamins-Minerals (HAIR SKIN AND NAILS FORMULA PO) Take by mouth.   Omega-3 Fatty Acids (FISH OIL) 1000 MG CAPS Take by mouth daily.   omeprazole (PRILOSEC) 40 MG capsule TAKE 1 CAPSULE(40 MG) BY MOUTH IN THE MORNING AND AT BEDTIME   polyethylene glycol powder (GLYCOLAX/MIRALAX) 17 GM/SCOOP powder Take 17 g by mouth daily.   sucralfate (CARAFATE) 1 g tablet Take 1 tablet (1 g total) by mouth 4 (four) times daily.   vitamin E 100 UNIT capsule Take 100 Units by mouth daily.   [DISCONTINUED] fluticasone (FLONASE) 50 MCG/ACT nasal spray SHAKE LIQUID  AND USE 2 SPRAYS IN EACH NOSTRIL DAILY   [DISCONTINUED] hydrochlorothiazide (HYDRODIURIL) 25 MG tablet Take 1 tablet (25 mg total) by mouth daily.   [DISCONTINUED] traZODone (DESYREL) 150 MG tablet TAKE 1 TABLET(150 MG) BY MOUTH AT BEDTIME   No facility-administered medications prior to visit.    Review of Systems  Constitutional:  Positive for fatigue. Negative for appetite change.  Eyes:  Negative for visual disturbance.  Respiratory:  Negative for shortness of breath.   Cardiovascular:  Negative for chest pain and leg swelling.  Gastrointestinal:  Negative for abdominal pain, nausea and vomiting.  Neurological:  Positive for dizziness, light-headedness and headaches.       Objective    BP 132/75 (BP Location: Left Arm, Patient Position: Sitting, Cuff Size: Normal)   Pulse 62   Temp 97.9 F (36.6 C) (Oral)   Ht '5\' 1"'$  (1.549 m)   Wt 129 lb (58.5 kg)   BMI 24.37 kg/m  BP Readings from Last 3 Encounters:  05/24/22 132/75  05/16/22 (!) 149/73  04/23/22 132/67   Wt Readings from Last 3 Encounters:  05/24/22 129 lb (58.5 kg)  05/16/22 132 lb (59.9 kg)  04/23/22 128 lb (58.1 kg)      Physical Exam Vitals reviewed.  Constitutional:      General: She is not in acute distress.    Appearance: Normal appearance. She is well-developed. She is not diaphoretic.  HENT:     Head: Normocephalic and atraumatic.  Eyes:     General: No scleral icterus.    Conjunctiva/sclera: Conjunctivae normal.  Neck:     Thyroid: No thyromegaly.  Cardiovascular:     Rate and Rhythm: Normal rate and regular rhythm.     Pulses: Normal pulses.     Heart sounds: Normal heart sounds. No murmur heard. Pulmonary:     Effort: Pulmonary effort is normal. No respiratory distress.     Breath sounds: Normal breath sounds. No wheezing, rhonchi or rales.  Musculoskeletal:     Cervical back: Neck supple.     Right lower leg: No edema.     Left lower leg: No edema.  Lymphadenopathy:     Cervical: No  cervical adenopathy.  Skin:    General: Skin is warm and dry.     Findings: No rash.  Neurological:     Mental Status: She is alert and oriented to person, place, and time. Mental status is at baseline.  Psychiatric:        Mood and Affect: Mood normal.        Behavior: Behavior normal.       No results found for any visits on 05/24/22.  Assessment & Plan     Problem List Items Addressed This Visit       Cardiovascular and Mediastinum   Hypertension - Primary    Well controlled  Continue current medications Recheck metabolic panel      Relevant Orders   Comprehensive metabolic panel     Respiratory   Allergic rhinitis    Now with some eustachian tube dysfunction Resume Flonase daily      Relevant Medications   fluticasone (FLONASE) 50 MCG/ACT nasal spray     Nervous and Auditory   Eustachian tube disorder, left    New problem Normal ear exam, but having ear pain in the setting of sinus congestion Encouraged regular use of Flonase        Other   Anxiety    Chronic and well-controlled Continue BuSpar at current dose      Relevant Medications   traZODone (DESYREL) 150 MG tablet   Other Relevant Orders   CBC   TSH   VITAMIN D 25 Hydroxy (Vit-D Deficiency, Fractures)   B12   Clinical depression    Longstanding and stable No depression symptoms currently Not on SSRI or SNRI      Relevant Medications   traZODone (DESYREL) 150 MG tablet   HLD (hyperlipidemia)    Reviewed last lipid panel Not currently on a statin Recheck FLP and CMP Discussed diet and exercise       Relevant Orders   Comprehensive metabolic panel   Lipid panel   Fatigue    Intermittent issue Worse recently Recheck labs, including CMP, CBC, TSH, vitamin D, B12 Treatment pending results      Relevant Orders   CBC   TSH   VITAMIN D 25 Hydroxy (Vit-D Deficiency, Fractures)   B12   Recurrent major depressive disorder, in partial remission (HCC)   Relevant Medications    traZODone (DESYREL) 150 MG tablet     Return in about 3 months (around 08/23/2022) for chronic disease f/u, With PCP.      I, Lavon Paganini, MD, have reviewed all documentation for this visit. The documentation on 05/24/22 for the exam, diagnosis, procedures, and orders are all accurate and complete.   [place], Dionne Bucy, MD, MPH [facility]

## 2022-05-24 NOTE — Assessment & Plan Note (Signed)
Intermittent issue Worse recently Recheck labs, including CMP, CBC, TSH, vitamin D, B12 Treatment pending results

## 2022-05-25 LAB — COMPREHENSIVE METABOLIC PANEL WITH GFR
ALT: 17 IU/L (ref 0–32)
AST: 14 IU/L (ref 0–40)
Albumin/Globulin Ratio: 2.3 — ABNORMAL HIGH (ref 1.2–2.2)
Albumin: 4.5 g/dL (ref 3.9–4.9)
Alkaline Phosphatase: 74 IU/L (ref 44–121)
BUN/Creatinine Ratio: 21 (ref 12–28)
BUN: 19 mg/dL (ref 8–27)
Bilirubin Total: 0.4 mg/dL (ref 0.0–1.2)
CO2: 25 mmol/L (ref 20–29)
Calcium: 9.7 mg/dL (ref 8.7–10.3)
Chloride: 102 mmol/L (ref 96–106)
Creatinine, Ser: 0.9 mg/dL (ref 0.57–1.00)
Globulin, Total: 2 g/dL (ref 1.5–4.5)
Glucose: 82 mg/dL (ref 70–99)
Potassium: 4.1 mmol/L (ref 3.5–5.2)
Sodium: 142 mmol/L (ref 134–144)
Total Protein: 6.5 g/dL (ref 6.0–8.5)
eGFR: 69 mL/min/1.73

## 2022-05-25 LAB — CBC
Hematocrit: 39.8 % (ref 34.0–46.6)
Hemoglobin: 13 g/dL (ref 11.1–15.9)
MCH: 29.5 pg (ref 26.6–33.0)
MCHC: 32.7 g/dL (ref 31.5–35.7)
MCV: 91 fL (ref 79–97)
Platelets: 302 x10E3/uL (ref 150–450)
RBC: 4.4 x10E6/uL (ref 3.77–5.28)
RDW: 13.1 % (ref 11.7–15.4)
WBC: 8.3 x10E3/uL (ref 3.4–10.8)

## 2022-05-25 LAB — LIPID PANEL
Chol/HDL Ratio: 4.4 ratio (ref 0.0–4.4)
Cholesterol, Total: 290 mg/dL — ABNORMAL HIGH (ref 100–199)
HDL: 66 mg/dL
LDL Chol Calc (NIH): 187 mg/dL — ABNORMAL HIGH (ref 0–99)
Triglycerides: 198 mg/dL — ABNORMAL HIGH (ref 0–149)
VLDL Cholesterol Cal: 37 mg/dL (ref 5–40)

## 2022-05-25 LAB — VITAMIN D 25 HYDROXY (VIT D DEFICIENCY, FRACTURES): Vit D, 25-Hydroxy: 24.8 ng/mL — ABNORMAL LOW (ref 30.0–100.0)

## 2022-05-25 LAB — TSH: TSH: 1.12 u[IU]/mL (ref 0.450–4.500)

## 2022-05-25 LAB — VITAMIN B12: Vitamin B-12: 639 pg/mL (ref 232–1245)

## 2022-05-25 MED ORDER — ROSUVASTATIN CALCIUM 5 MG PO TABS: 5.0000 mg | ORAL_TABLET | Freq: Every day | ORAL | 1 refills | Status: AC

## 2022-05-25 NOTE — Addendum Note (Signed)
Addended by: Smitty Knudsen on: 05/25/2022 01:46 PM   Modules accepted: Orders

## 2022-06-05 ENCOUNTER — Ambulatory Visit: Payer: Medicare HMO | Admitting: Physician Assistant

## 2022-06-14 ENCOUNTER — Other Ambulatory Visit: Payer: Self-pay | Admitting: Physician Assistant

## 2022-06-14 DIAGNOSIS — I10 Essential (primary) hypertension: Secondary | ICD-10-CM

## 2022-06-14 NOTE — Telephone Encounter (Signed)
Unable to refill per protocol, Rx expired. Medication was discontinued 05/24/22. Will refuse.  Requested Prescriptions  Pending Prescriptions Disp Refills   hydrochlorothiazide (HYDRODIURIL) 25 MG tablet [Pharmacy Med Name: HYDROCHLOROTHIAZIDE '25MG'$  TABLETS] 90 tablet     Sig: TAKE 1 TABLET(25 MG) BY MOUTH DAILY     Cardiovascular: Diuretics - Thiazide Passed - 06/14/2022  3:40 AM      Passed - Cr in normal range and within 180 days    Creatinine, Ser  Date Value Ref Range Status  05/24/2022 0.90 0.57 - 1.00 mg/dL Final         Passed - K in normal range and within 180 days    Potassium  Date Value Ref Range Status  05/24/2022 4.1 3.5 - 5.2 mmol/L Final         Passed - Na in normal range and within 180 days    Sodium  Date Value Ref Range Status  05/24/2022 142 134 - 144 mmol/L Final         Passed - Last BP in normal range    BP Readings from Last 1 Encounters:  05/24/22 132/75         Passed - Valid encounter within last 6 months    Recent Outpatient Visits           3 weeks ago Primary hypertension   [facility] [place], Dionne Bucy, MD   4 weeks ago Hypertension, unspecified type   [facility] [place], [place], PA-C   1 month ago Seasonal allergic rhinitis due to other allergic trigger   [facility] Birdie Sons, MD   2 months ago Primary hypertension   [facility] Mikey Kirschner, PA-C   3 months ago Primary hypertension   [facility] [place] Mikey Kirschner, PA-C       Future Appointments             In 2 months Simmons-Robinson, Riki Sheer, MD [facility], [place]

## 2022-06-27 ENCOUNTER — Other Ambulatory Visit: Payer: Self-pay | Admitting: Family Medicine

## 2022-06-27 DIAGNOSIS — F32A Depression, unspecified: Secondary | ICD-10-CM

## 2022-06-27 MED ORDER — BUSPIRONE HCL 10 MG PO TABS: ORAL_TABLET | ORAL | 3 refills | Status: AC

## 2022-06-27 NOTE — Telephone Encounter (Signed)
[facility] faxed refill request for the following medications:   busPIRone (BUSPAR) 10 MG tablet    Please advise

## 2022-07-12 DIAGNOSIS — M545 Low back pain, unspecified: Secondary | ICD-10-CM | POA: Diagnosis not present

## 2022-07-12 DIAGNOSIS — G8929 Other chronic pain: Secondary | ICD-10-CM | POA: Diagnosis not present

## 2022-07-12 DIAGNOSIS — H9203 Otalgia, bilateral: Secondary | ICD-10-CM | POA: Diagnosis not present

## 2022-07-12 DIAGNOSIS — F419 Anxiety disorder, unspecified: Secondary | ICD-10-CM | POA: Diagnosis not present

## 2022-08-09 DIAGNOSIS — M26643 Arthritis of bilateral temporomandibular joint: Secondary | ICD-10-CM | POA: Diagnosis not present

## 2022-08-09 DIAGNOSIS — H9203 Otalgia, bilateral: Secondary | ICD-10-CM | POA: Diagnosis not present

## 2022-08-09 DIAGNOSIS — H6123 Impacted cerumen, bilateral: Secondary | ICD-10-CM | POA: Diagnosis not present

## 2022-08-13 ENCOUNTER — Other Ambulatory Visit: Payer: Self-pay | Admitting: Physician Assistant

## 2022-08-13 DIAGNOSIS — K219 Gastro-esophageal reflux disease without esophagitis: Secondary | ICD-10-CM

## 2022-08-14 ENCOUNTER — Other Ambulatory Visit: Payer: Self-pay | Admitting: Physician Assistant

## 2022-08-14 DIAGNOSIS — F32A Depression, unspecified: Secondary | ICD-10-CM

## 2022-08-14 NOTE — Telephone Encounter (Signed)
Requested medication (s) are due for refill today: yes  Requested medication (s) are on the active medication list: yes  Last refill:  05/24/22 #90 0 refills  Future visit scheduled: yes in 2 weeks  Notes to clinic:  no refills remain. Do you want to refill Rx?     Requested Prescriptions  Pending Prescriptions Disp Refills   traZODone (DESYREL) 150 MG tablet [Pharmacy Med Name: TRAZODONE '150MG'$  (HUNDRED-FIFTY) TAB] 90 tablet 0    Sig: TAKE 1 TABLET(150 MG) BY MOUTH AT BEDTIME     Psychiatry: Antidepressants - Serotonin Modulator Passed - 08/14/2022  3:42 AM      Passed - Completed PHQ-2 or PHQ-9 in the last 360 days      Passed - Valid encounter within last 6 months    Recent Outpatient Visits           2 months ago Primary hypertension   [place], Dionne Bucy, MD   3 months ago Hypertension, unspecified type   [place], [place], PA-C   3 months ago Seasonal allergic rhinitis due to other allergic trigger   [place], Donald E, MD   4 months ago Primary hypertension   [place] Mikey Kirschner, PA-C   5 months ago Primary hypertension   [place] Mikey Kirschner, PA-C       Future Appointments             In 6 days V[place], Tally Due, MD [facility]   In 2 weeks Simmons-Robinson, Riki Sheer, MD [facility], [facility]

## 2022-08-14 NOTE — Telephone Encounter (Signed)
Requested Prescriptions  Pending Prescriptions Disp Refills   omeprazole (PRILOSEC) 40 MG capsule [Pharmacy Med Name: OMEPRAZOLE '40MG'$  CAPSULES] 180 capsule 0    Sig: TAKE 1 CAPSULE(40 MG) BY MOUTH IN THE MORNING AND AT BEDTIME     Gastroenterology: Proton Pump Inhibitors Passed - 08/13/2022  3:42 AM      Passed - Valid encounter within last 12 months    Recent Outpatient Visits           2 months ago Primary hypertension   [place], Dionne Bucy, MD   3 months ago Hypertension, unspecified type   [place], [place], PA-C   3 months ago Seasonal allergic rhinitis due to other allergic trigger   [facility] Birdie Sons, MD   4 months ago Primary hypertension   [place] Mikey Kirschner, PA-C   5 months ago Primary hypertension   [place] Mikey Kirschner, PA-C       Future Appointments             In 6 days Vanga, Tally Due, MD [place] [facility] at [facility]   In 2 weeks Simmons-Robinson, Riki Sheer, MD [facility], [facility]

## 2022-08-15 ENCOUNTER — Other Ambulatory Visit: Payer: Self-pay

## 2022-08-15 MED FILL — Iron Sucrose Inj 20 MG/ML (Fe Equiv): INTRAVENOUS | Qty: 10 | Status: AC

## 2022-08-16 ENCOUNTER — Inpatient Hospital Stay: Payer: Medicare HMO | Attending: Nurse Practitioner | Admitting: Nurse Practitioner

## 2022-08-16 ENCOUNTER — Telehealth: Payer: Self-pay | Admitting: Family Medicine

## 2022-08-16 ENCOUNTER — Encounter: Payer: Self-pay | Admitting: Nurse Practitioner

## 2022-08-16 ENCOUNTER — Inpatient Hospital Stay: Payer: Medicare HMO

## 2022-08-16 ENCOUNTER — Other Ambulatory Visit: Payer: Self-pay

## 2022-08-16 VITALS — BP 136/72 | HR 59 | Temp 98.0°F | Wt 129.6 lb

## 2022-08-16 DIAGNOSIS — D649 Anemia, unspecified: Secondary | ICD-10-CM

## 2022-08-16 DIAGNOSIS — D509 Iron deficiency anemia, unspecified: Secondary | ICD-10-CM | POA: Diagnosis not present

## 2022-08-16 LAB — CBC WITH DIFFERENTIAL/PLATELET
Abs Immature Granulocytes: 0.02 10*3/uL (ref 0.00–0.07)
Basophils Absolute: 0.1 10*3/uL (ref 0.0–0.1)
Basophils Relative: 1 %
Eosinophils Absolute: 0.1 10*3/uL (ref 0.0–0.5)
Eosinophils Relative: 1 %
HCT: 40.8 % (ref 36.0–46.0)
Hemoglobin: 13 g/dL (ref 12.0–15.0)
Immature Granulocytes: 0 %
Lymphocytes Relative: 36 %
Lymphs Abs: 2.8 10*3/uL (ref 0.7–4.0)
MCH: 29.5 pg (ref 26.0–34.0)
MCHC: 31.9 g/dL (ref 30.0–36.0)
MCV: 92.5 fL (ref 80.0–100.0)
Monocytes Absolute: 0.6 10*3/uL (ref 0.1–1.0)
Monocytes Relative: 8 %
Neutro Abs: 4.2 10*3/uL (ref 1.7–7.7)
Neutrophils Relative %: 54 %
Platelets: 247 10*3/uL (ref 150–400)
RBC: 4.41 MIL/uL (ref 3.87–5.11)
RDW: 12.3 % (ref 11.5–15.5)
WBC: 7.8 10*3/uL (ref 4.0–10.5)
nRBC: 0 % (ref 0.0–0.2)

## 2022-08-16 LAB — IRON AND TIBC
Iron: 70 ug/dL (ref 28–170)
Saturation Ratios: 23 % (ref 10.4–31.8)
TIBC: 302 ug/dL (ref 250–450)
UIBC: 232 ug/dL

## 2022-08-16 LAB — FERRITIN: Ferritin: 82 ng/mL (ref 11–307)

## 2022-08-16 MED ORDER — TRIAMTERENE-HCTZ 37.5-25 MG PO CAPS: 1.0000 | ORAL_CAPSULE | Freq: Every morning | ORAL | 11 refills | Status: AC

## 2022-08-16 NOTE — Telephone Encounter (Signed)
[place] faxed refill request for the following medications:    triamterene-hydrochlorothiazide (DYAZIDE) 37.5-25 MG capsule    Please advise

## 2022-08-16 NOTE — Patient Instructions (Signed)
It has been our pleasure to be involved if your care. Please don't hesitate to give [place] a call if you start feeling poorly or if your blood counts drop. Ander Purpura, NP

## 2022-08-16 NOTE — Progress Notes (Signed)
[facility]  Telephone:([phone]) [phone] Fax:([phone]) [phone]  ID: Jocelyn Harris OB: [birth year]  MR#: [id number]  IWO#:[id number]  Patient Care Team: Hildred Laser, MD as Referring Physician (Obstetrics and Gynecology)  CHIEF COMPLAINT: Iron deficiency anemia.  INTERVAL HISTORY: Patient returns to clinic today for repeat laboratory work, further evaluation, and consideration of IV Venofer if needed.  She currently feels well and is asymptomatic.  She does not complain of weakness or fatigue today.  She has no neurologic complaints.  She denies any recent fevers or illnesses.  She has a good appetite and denies weight loss.  She has no chest pain, shortness of breath, cough, or hemoptysis.  She denies any nausea, vomiting, constipation, or diarrhea.  She has no melena or hematochezia.  She has no urinary complaints.  Patient offers no specific complaints today.  REVIEW OF SYSTEMS:   Review of Systems  Constitutional: Negative.  Negative for fever, malaise/fatigue and weight loss.  Respiratory: Negative.  Negative for cough, hemoptysis and shortness of breath.   Cardiovascular: Negative.  Negative for chest pain and leg swelling.  Gastrointestinal: Negative.  Negative for abdominal pain, blood in stool, constipation, heartburn and melena.  Genitourinary: Negative.  Negative for frequency.  Musculoskeletal: Negative.  Negative for back pain.  Skin: Negative.  Negative for itching and rash.  Neurological: Negative.  Negative for dizziness, speech change, focal weakness and headaches.  Psychiatric/Behavioral: Negative.  The patient is not nervous/anxious.   As per HPI. Otherwise, a complete review of systems is negative.  PAST MEDICAL HISTORY: Past Medical History:  Diagnosis Date   Anemia    GERD (gastroesophageal reflux disease)    Hypertension    Iron deficiency anemia 01/17/2021   Positive fecal occult blood test    TMJ (dislocation of temporomandibular joint)      PAST SURGICAL HISTORY: Past Surgical History:  Procedure Laterality Date   COLONOSCOPY WITH PROPOFOL N/A 12/01/2020   Procedure: COLONOSCOPY WITH PROPOFOL;  Surgeon: Pasty Spillers, MD;  Location: [facility] ENDOSCOPY;  Service: Endoscopy;  Laterality: N/A;   ESOPHAGOGASTRODUODENOSCOPY (EGD) WITH PROPOFOL N/A 12/01/2020   Procedure: ESOPHAGOGASTRODUODENOSCOPY (EGD) WITH PROPOFOL;  Surgeon: Pasty Spillers, MD;  Location: [facility] ENDOSCOPY;  Service: Endoscopy;  Laterality: N/A;   GIVENS CAPSULE STUDY N/A 03/07/2021   Procedure: GIVENS CAPSULE STUDY;  Surgeon: Pasty Spillers, MD;  Location: [facility] ENDOSCOPY;  Service: Endoscopy;  Laterality: N/A;   KNEE SURGERY Right    TOE SURGERY     TUBAL LIGATION      FAMILY HISTORY: Family History  Problem Relation Age of Onset   Diabetes Mother    Hypertension Mother    Congestive Heart Failure Mother    Hypertension Father    Lung cancer Father    Fibromyalgia Sister    Lupus Sister    Cancer Sister        of blood   Leukemia Paternal Grandmother    Healthy Daughter     ADVANCED DIRECTIVES (Y/N):  N  HEALTH MAINTENANCE: Social History   Tobacco Use   Smoking status: Never   Smokeless tobacco: Never  Vaping Use   Vaping Use: Never used  Substance Use Topics   Alcohol use: No    Alcohol/week: 0.0 standard drinks of alcohol   Drug use: No     Colonoscopy:  PAP:  Bone density:  Lipid panel:  Allergies  Allergen Reactions   Augmentin [Amoxicillin-Pot Clavulanate] Nausea Only    Patient reports that her reflux was bad when  she took medication   Doxycycline Nausea Only and Nausea And Vomiting    Current Outpatient Medications  Medication Sig Dispense Refill   azelastine (ASTELIN) 0.1 % nasal spray Place 2 sprays into both nostrils 2 (two) times daily. Use in each nostril as directed 30 mL 3   Biotin 1 MG CAPS Take by mouth.     busPIRone (BUSPAR) 10 MG tablet TAKE 1 TABLET(10 MG) BY MOUTH THREE TIMES DAILY 90  tablet 3   calcium gluconate 500 MG tablet Take 1 tablet by mouth daily.      cetirizine (ZYRTEC) 10 MG tablet TAKE 1 TABLET(10 MG) BY MOUTH DAILY 90 tablet 3   Cholecalciferol 1000 UNITS capsule Take 1,000 Units by mouth daily.     cyclobenzaprine (FLEXERIL) 10 MG tablet Take 1 tablet (10 mg total) by mouth at bedtime as needed for muscle spasms. 30 tablet 1   fluticasone (FLONASE) 50 MCG/ACT nasal spray SHAKE LIQUID AND USE 2 SPRAYS IN EACH NOSTRIL DAILY 16 g 2   gabapentin (NEURONTIN) 100 MG capsule TAKE 1 CAPSULE(100 MG) BY MOUTH THREE TIMES DAILY 90 capsule 2   hydrochlorothiazide (HYDRODIURIL) 12.5 MG tablet Take 12.5 mg by mouth daily.     lisinopril (ZESTRIL) 40 MG tablet Take 1 tablet (40 mg total) by mouth daily. 90 tablet 1   Lysine 500 MG TABS Take by mouth as needed.     montelukast (SINGULAIR) 10 MG tablet TAKE 1 TABLET BY MOUTH EVERY NIGHT AT BEDTIME 90 tablet 2   MULTIPLE VITAMINS PO Take by mouth daily.     Multiple Vitamins-Minerals (HAIR SKIN AND NAILS FORMULA PO) Take by mouth.     Omega-3 Fatty Acids (FISH OIL) 1000 MG CAPS Take by mouth daily.     omeprazole (PRILOSEC) 40 MG capsule TAKE 1 CAPSULE(40 MG) BY MOUTH IN THE MORNING AND AT BEDTIME 180 capsule 0   polyethylene glycol powder (GLYCOLAX/MIRALAX) 17 GM/SCOOP powder Take 17 g by mouth daily. 507 g 1   rosuvastatin (CRESTOR) 5 MG tablet Take 1 tablet (5 mg total) by mouth daily. 90 tablet 1   sucralfate (CARAFATE) 1 g tablet Take 1 tablet (1 g total) by mouth 4 (four) times daily. 120 tablet 0   traZODone (DESYREL) 150 MG tablet TAKE 1 TABLET(150 MG) BY MOUTH AT BEDTIME 90 tablet 0   triamterene-hydrochlorothiazide (DYAZIDE) 37.5-25 MG capsule Take 1 each (1 capsule total) by mouth every morning. 30 capsule 11   vitamin E 100 UNIT capsule Take 100 Units by mouth daily.     No current facility-administered medications for this visit.    OBJECTIVE: Vitals:   08/16/22 1405  BP: 136/72  Pulse: (!) 59  Temp: 98 F  (36.7 C)     Body mass index is 24.49 kg/m.    ECOG FS:0 - Asymptomatic  General: Well-developed, well-nourished, no acute distress. Eyes: Pink conjunctiva, anicteric sclera. HEENT: Normocephalic, moist mucous membranes. Lungs: No audible wheezing or coughing. Heart: Regular rate and rhythm. Abdomen: Soft, nontender, no obvious distention. Musculoskeletal: No edema, cyanosis, or clubbing. Neuro: Alert, answering all questions appropriately. Cranial nerves grossly intact. Skin: No rashes or petechiae noted. Psych: Normal affect.   LAB RESULTS: Lab Results  Component Value Date   WBC 7.8 08/16/2022   NEUTROABS 4.2 08/16/2022   HGB 13.0 08/16/2022   HCT 40.8 08/16/2022   MCV 92.5 08/16/2022   PLT 247 08/16/2022   Lab Results  Component Value Date   IRON 70 08/16/2022   TIBC 302  08/16/2022   IRONPCTSAT 23 08/16/2022   Lab Results  Component Value Date   FERRITIN 82 08/16/2022     STUDIES: No results found.  ASSESSMENT: Iron deficiency anemia.    PLAN:    Iron deficiency anemia: Patient's hemoglobin and iron stores continue to be within normal limits.  Previously, all of her other laboratory work was either negative or within normal limits.  Patient reports constipation with oral iron supplementation.  Patient last received IV Venofer on Oct 26, 2021. Labs reviewed and continue to be stable with resolution of anemia. Iron studies and ferritin pending at time of visit but later resulted and remain normal. Therefore, she can be released from clinic and followe dby her pcp. Should she have recurrent anemia or iron deficiency, she can return to clinic for reevaluation.  Disposition:  As needed- la  I spent a total of 20 minutes reviewing chart data, face-to-face evaluation with the patient, counseling and coordination of care as detailed above.  Patient expressed understanding and was in agreement with this plan. She also understands that She can call clinic at any time  with any questions, concerns, or complaints.   Alinda Dooms, NP   08/16/2022

## 2022-08-17 DIAGNOSIS — I10 Essential (primary) hypertension: Secondary | ICD-10-CM | POA: Diagnosis not present

## 2022-08-17 DIAGNOSIS — F3341 Major depressive disorder, recurrent, in partial remission: Secondary | ICD-10-CM | POA: Diagnosis not present

## 2022-08-17 DIAGNOSIS — I872 Venous insufficiency (chronic) (peripheral): Secondary | ICD-10-CM | POA: Diagnosis not present

## 2022-08-17 DIAGNOSIS — F419 Anxiety disorder, unspecified: Secondary | ICD-10-CM | POA: Diagnosis not present

## 2022-08-17 DIAGNOSIS — E7800 Pure hypercholesterolemia, unspecified: Secondary | ICD-10-CM | POA: Diagnosis not present

## 2022-08-20 ENCOUNTER — Ambulatory Visit: Payer: Medicare HMO | Admitting: Gastroenterology

## 2022-08-20 ENCOUNTER — Other Ambulatory Visit: Payer: Self-pay | Admitting: Family Medicine

## 2022-08-20 DIAGNOSIS — J3089 Other allergic rhinitis: Secondary | ICD-10-CM

## 2022-08-24 DIAGNOSIS — J320 Chronic maxillary sinusitis: Secondary | ICD-10-CM | POA: Diagnosis not present

## 2022-08-24 DIAGNOSIS — J301 Allergic rhinitis due to pollen: Secondary | ICD-10-CM | POA: Diagnosis not present

## 2022-08-27 NOTE — Progress Notes (Unsigned)
    GYNECOLOGY PROGRESS NOTE  Subjective:    Patient ID: Jocelyn Harris, female    DOB: [birth year], 72 y.o.   MRN: [id number]  HPI  Patient is a 73 y.o. G1P0 female who presents for cervical cancer screening. Her last pap smear was 05/31/2017 and it was NILM and negative for HPV and it was recommended that she continue routine screenings. She did have a abnormal pap smear on 03/22/2016 that was NILM and positive for high risk HPV.  {Common ambulatory SmartLinks:19316}  Review of Systems {ros; complete:30496}   Objective:   There were no vitals taken for this visit. There is no height or weight on file to calculate BMI. General appearance: {general exam:16600} Abdomen: {abdominal exam:16834} Pelvic: {pelvic exam:16852::"cervix normal in appearance","external genitalia normal","no adnexal masses or tenderness","no cervical motion tenderness","rectovaginal septum normal","uterus normal size, shape, and consistency","vagina normal without discharge"} Extremities: {extremity exam:5109} Neurologic: {neuro exam:17854}   Assessment:   1. Cervical cancer screening      Plan:   1. Cervical cancer screening ***       Rubie Maid, MD [place]

## 2022-08-28 ENCOUNTER — Encounter: Payer: Self-pay | Admitting: Obstetrics and Gynecology

## 2022-08-28 ENCOUNTER — Ambulatory Visit: Payer: Medicare HMO | Admitting: Obstetrics and Gynecology

## 2022-08-28 VITALS — BP 131/67 | HR 62 | Resp 15 | Ht 61.0 in | Wt 127.0 lb

## 2022-08-28 DIAGNOSIS — Z124 Encounter for screening for malignant neoplasm of cervix: Secondary | ICD-10-CM

## 2022-08-28 DIAGNOSIS — Z8742 Personal history of other diseases of the female genital tract: Secondary | ICD-10-CM

## 2022-08-28 DIAGNOSIS — N952 Postmenopausal atrophic vaginitis: Secondary | ICD-10-CM

## 2022-08-28 MED ORDER — PREMARIN 0.625 MG/GM VA CREA: 1.0000 | TOPICAL_CREAM | VAGINAL | 4 refills | Status: AC

## 2022-08-30 ENCOUNTER — Ambulatory Visit: Payer: Medicare HMO | Admitting: Family Medicine

## 2022-09-25 ENCOUNTER — Encounter: Payer: Self-pay | Admitting: Oncology

## 2022-10-04 ENCOUNTER — Ambulatory Visit: Payer: Medicare HMO | Admitting: Obstetrics and Gynecology

## 2022-10-18 DIAGNOSIS — I10 Essential (primary) hypertension: Secondary | ICD-10-CM | POA: Diagnosis not present

## 2022-10-18 DIAGNOSIS — F419 Anxiety disorder, unspecified: Secondary | ICD-10-CM | POA: Diagnosis not present

## 2022-10-18 DIAGNOSIS — Z8709 Personal history of other diseases of the respiratory system: Secondary | ICD-10-CM | POA: Diagnosis not present

## 2022-10-18 DIAGNOSIS — M546 Pain in thoracic spine: Secondary | ICD-10-CM | POA: Diagnosis not present

## 2022-11-06 DIAGNOSIS — J320 Chronic maxillary sinusitis: Secondary | ICD-10-CM | POA: Diagnosis not present

## 2022-11-06 DIAGNOSIS — U071 COVID-19: Secondary | ICD-10-CM | POA: Diagnosis not present

## 2022-11-06 DIAGNOSIS — F419 Anxiety disorder, unspecified: Secondary | ICD-10-CM | POA: Diagnosis not present

## 2022-11-07 NOTE — Progress Notes (Deleted)
    GYNECOLOGY PROGRESS NOTE  Subjective:    Patient ID: DENVER FELTZ, female    DOB: 08-01-1950, 72 y.o.   MRN: [id number]  HPI  Patient is a 72 y.o. G1P0 female who presents for follow up on Vaginal Atrophy. She presented in March with concerns of vaginal dryness x several years. She has started a new relationship and she is concerned that intercourse could be painful as it was in the past with her husband, who is now deceased. She was started on Premarin vaginal cream for vaginal atrophy and today she reports that   {Common ambulatory SmartLinks:19316}  Review of Systems {ros; complete:30496}   Objective:   There were no vitals taken for this visit. There is no height or weight on file to calculate BMI. General appearance: {general exam:16600} Abdomen: {abdominal exam:16834} Pelvic: {pelvic exam:16852::"cervix normal in appearance","external genitalia normal","no adnexal masses or tenderness","no cervical motion tenderness","rectovaginal septum normal","uterus normal size, shape, and consistency","vagina normal without discharge"} Extremities: {extremity exam:5109} Neurologic: {neuro exam:17854}   Assessment:   No diagnosis found.   Plan:   There are no diagnoses linked to this encounter.     Hildred Laser, MD [facility]

## 2022-11-08 ENCOUNTER — Ambulatory Visit: Payer: Medicare HMO | Admitting: Obstetrics and Gynecology

## 2022-11-10 ENCOUNTER — Other Ambulatory Visit: Payer: Self-pay | Admitting: Family Medicine

## 2022-11-10 DIAGNOSIS — K219 Gastro-esophageal reflux disease without esophagitis: Secondary | ICD-10-CM

## 2022-11-13 ENCOUNTER — Telehealth: Payer: Self-pay | Admitting: Family Medicine

## 2022-11-13 NOTE — Telephone Encounter (Signed)
Requested Prescriptions  Pending Prescriptions Disp Refills   omeprazole (PRILOSEC) 40 MG capsule [Pharmacy Med Name: OMEPRAZOLE 40MG  CAPSULES] 180 capsule 0    Sig: TAKE 1 CAPSULE(40 MG) BY MOUTH IN THE MORNING AND AT BEDTIME     Gastroenterology: Proton Pump Inhibitors Passed - 11/10/2022  9:16 AM      Passed - Valid encounter within last 12 months    Recent Outpatient Visits           5 months ago Primary hypertension   [place] [facility] [place], Marzella Schlein, MD   6 months ago Hypertension, unspecified type   [facility] [place], [place], PA-C   6 months ago Seasonal allergic rhinitis due to other allergic trigger   [facility] Malva Limes, MD   7 months ago Primary hypertension   [place] [facility] Alfredia Ferguson, PA-C   8 months ago Primary hypertension   [facility] Alfredia Ferguson, [place]

## 2022-11-13 NOTE — Telephone Encounter (Signed)
[facility] pharmacy faxed refill request for the following medications:   traZODone (DESYREL) 150 MG tablet    Please advise

## 2022-11-19 ENCOUNTER — Other Ambulatory Visit: Payer: Self-pay | Admitting: Family Medicine

## 2022-11-19 DIAGNOSIS — E785 Hyperlipidemia, unspecified: Secondary | ICD-10-CM

## 2022-11-23 DIAGNOSIS — J0101 Acute recurrent maxillary sinusitis: Secondary | ICD-10-CM | POA: Diagnosis not present

## 2022-11-29 ENCOUNTER — Other Ambulatory Visit: Payer: Self-pay | Admitting: Family Medicine

## 2022-11-29 DIAGNOSIS — E785 Hyperlipidemia, unspecified: Secondary | ICD-10-CM

## 2022-11-29 DIAGNOSIS — K219 Gastro-esophageal reflux disease without esophagitis: Secondary | ICD-10-CM

## 2022-11-29 NOTE — Telephone Encounter (Signed)
Called patient to verify if she is going to continue see a provider at [facility] and not at [facility] clinic with Dr. Sullivan Lone regarding medication refills.

## 2022-11-29 NOTE — Telephone Encounter (Signed)
Requested by interface surescripts. In review of chart last OV with Dr. Sullivan Lone at [facility] Dr. Sullivan Lone 10/18/22 and no longer at this practice.  Requested Prescriptions  Refused Prescriptions Disp Refills   rosuvastatin (CRESTOR) 5 MG tablet [Pharmacy Med Name: ROSUVASTATIN 5MG  TABLETS] 90 tablet 1    Sig: TAKE 1 TABLET(5 MG) BY MOUTH DAILY     Cardiovascular:  Antilipid - Statins 2 Failed - 11/29/2022  8:44 AM      Failed - Lipid Panel in normal range within the last 12 months    Cholesterol, Total  Date Value Ref Range Status  05/24/2022 290 (H) 100 - 199 mg/dL Final   LDL Chol Calc (NIH)  Date Value Ref Range Status  05/24/2022 187 (H) 0 - 99 mg/dL Final   HDL  Date Value Ref Range Status  05/24/2022 66 >39 mg/dL Final   Triglycerides  Date Value Ref Range Status  05/24/2022 198 (H) 0 - 149 mg/dL Final         Passed - Cr in normal range and within 360 days    Creatinine, Ser  Date Value Ref Range Status  05/24/2022 0.90 0.57 - 1.00 mg/dL Final         Passed - Patient is not pregnant      Passed - Valid encounter within last 12 months    Recent Outpatient Visits           6 months ago Primary hypertension   [place] [facility] [place], Marzella Schlein, MD   6 months ago Hypertension, unspecified type   [facility] [place], [place], PA-C   7 months ago Seasonal allergic rhinitis due to other allergic trigger   [facility] Malva Limes, MD   7 months ago Primary hypertension    [facility] Alfredia Ferguson, PA-C   8 months ago Primary hypertension   [facility] Alfredia Ferguson, [place]

## 2023-01-11 DIAGNOSIS — B001 Herpesviral vesicular dermatitis: Secondary | ICD-10-CM | POA: Diagnosis not present

## 2023-01-11 DIAGNOSIS — F419 Anxiety disorder, unspecified: Secondary | ICD-10-CM | POA: Diagnosis not present

## 2023-01-11 DIAGNOSIS — J301 Allergic rhinitis due to pollen: Secondary | ICD-10-CM | POA: Diagnosis not present

## 2023-01-11 DIAGNOSIS — D509 Iron deficiency anemia, unspecified: Secondary | ICD-10-CM | POA: Diagnosis not present

## 2023-01-11 DIAGNOSIS — F3341 Major depressive disorder, recurrent, in partial remission: Secondary | ICD-10-CM | POA: Diagnosis not present

## 2023-01-11 DIAGNOSIS — Z0000 Encounter for general adult medical examination without abnormal findings: Secondary | ICD-10-CM | POA: Diagnosis not present

## 2023-01-11 DIAGNOSIS — R5383 Other fatigue: Secondary | ICD-10-CM | POA: Diagnosis not present

## 2023-01-11 DIAGNOSIS — I10 Essential (primary) hypertension: Secondary | ICD-10-CM | POA: Diagnosis not present

## 2023-02-08 ENCOUNTER — Other Ambulatory Visit: Payer: Self-pay | Admitting: Family Medicine

## 2023-02-08 DIAGNOSIS — F32A Depression, unspecified: Secondary | ICD-10-CM

## 2023-03-12 ENCOUNTER — Telehealth: Payer: Self-pay | Admitting: Gastroenterology

## 2023-03-12 NOTE — Telephone Encounter (Signed)
Patient called in to schedule and office visit.

## 2023-03-19 ENCOUNTER — Telehealth: Payer: Self-pay | Admitting: Gastroenterology

## 2023-03-19 NOTE — Telephone Encounter (Signed)
Patient wanted to know if an earlier appointment is available. The patient was unable to take the appointment because of work.

## 2023-03-22 ENCOUNTER — Other Ambulatory Visit: Payer: Self-pay | Admitting: Family Medicine

## 2023-03-22 DIAGNOSIS — Z1231 Encounter for screening mammogram for malignant neoplasm of breast: Secondary | ICD-10-CM

## 2023-03-28 ENCOUNTER — Ambulatory Visit
Admission: RE | Admit: 2023-03-28 | Discharge: 2023-03-28 | Disposition: A | Discharge status: Home / Self Care | Payer: Medicare HMO | Source: Ambulatory Visit | Attending: Family Medicine | Admitting: Family Medicine

## 2023-03-28 DIAGNOSIS — Z1231 Encounter for screening mammogram for malignant neoplasm of breast: Secondary | ICD-10-CM | POA: Insufficient documentation

## 2023-04-08 ENCOUNTER — Other Ambulatory Visit: Payer: Self-pay | Admitting: Family Medicine

## 2023-04-08 DIAGNOSIS — F32A Depression, unspecified: Secondary | ICD-10-CM

## 2023-04-10 NOTE — Telephone Encounter (Signed)
Requested medication (s) are due for refill today: Yes  Requested medication (s) are on the active medication list: Yes  Last refill:  06/27/22  Future visit scheduled: No  Notes to clinic:  Pt. Not seen at practice anymore.    Requested Prescriptions  Pending Prescriptions Disp Refills   busPIRone (BUSPAR) 10 MG tablet [Pharmacy Med Name: BUSPIRONE 10MG  TABLETS] 90 tablet 3    Sig: TAKE 1 TABLET(10 MG) BY MOUTH THREE TIMES DAILY     Psychiatry: Anxiolytics/Hypnotics - Non-controlled Passed - 04/08/2023  9:14 PM      Passed - Valid encounter within last 12 months    Recent Outpatient Visits           10 months ago Primary hypertension   [facility] [place], Marzella Schlein, MD   10 months ago Hypertension, unspecified type   [facility] [place], [place], PA-C   11 months ago Seasonal allergic rhinitis due to other allergic trigger   [facility] Malva Limes, MD   12 months ago Primary hypertension   [place] [facility] Alfredia Ferguson, PA-C   1 year ago Primary hypertension    [facility] Alfredia Ferguson, PA-C       Future Appointments             In 4 weeks Vanga, Loel Dubonnet, MD [facility]

## 2023-05-02 ENCOUNTER — Other Ambulatory Visit: Payer: Self-pay | Admitting: Physician Assistant

## 2023-05-02 DIAGNOSIS — J3089 Other allergic rhinitis: Secondary | ICD-10-CM

## 2023-05-03 ENCOUNTER — Other Ambulatory Visit: Payer: Self-pay

## 2023-05-08 ENCOUNTER — Encounter: Payer: Self-pay | Admitting: Gastroenterology

## 2023-05-08 ENCOUNTER — Encounter: Payer: Self-pay | Admitting: Oncology

## 2023-05-08 ENCOUNTER — Ambulatory Visit: Payer: Medicare HMO | Admitting: Gastroenterology

## 2023-05-08 VITALS — BP 172/83 | HR 57 | Temp 98.3°F | Ht 61.0 in | Wt 132.1 lb

## 2023-05-08 DIAGNOSIS — K219 Gastro-esophageal reflux disease without esophagitis: Secondary | ICD-10-CM | POA: Diagnosis not present

## 2023-05-08 DIAGNOSIS — K580 Irritable bowel syndrome with diarrhea: Secondary | ICD-10-CM

## 2023-05-08 DIAGNOSIS — R1013 Epigastric pain: Secondary | ICD-10-CM | POA: Diagnosis not present

## 2023-05-08 DIAGNOSIS — R140 Abdominal distension (gaseous): Secondary | ICD-10-CM

## 2023-05-08 DIAGNOSIS — R197 Diarrhea, unspecified: Secondary | ICD-10-CM | POA: Diagnosis not present

## 2023-05-08 MED ORDER — HYOSCYAMINE SULFATE 0.125 MG SL SUBL: 0.1250 mg | SUBLINGUAL_TABLET | Freq: Three times a day (TID) | SUBLINGUAL | 0 refills | Status: AC

## 2023-05-08 MED ORDER — OMEPRAZOLE 40 MG PO CPDR
40.0000 mg | DELAYED_RELEASE_CAPSULE | Freq: Two times a day (BID) | ORAL | 0 refills | Status: AC
Start: 2023-05-08 — End: ?

## 2023-05-08 NOTE — Patient Instructions (Signed)
Gave Ibguard samples and Restora samples. IF they work you can buy them over the counter.

## 2023-05-08 NOTE — Progress Notes (Signed)
[name], MD [address]  [address]  [place], [place] [address]  Main: [phone]  Fax: [phone]    Gastroenterology Consultation  Referring Provider:     Bosie Clos, MD Primary Care Physician:  Bosie Clos, MD Primary Gastroenterologist:  Dr. Allegra Lai Reason for Consultation: GERD, diarrhea        HPI:   Jocelyn Harris is a 72 y.o. female referred by Dr. Sullivan Lone, Ferdinand Lango, MD  for consultation & management of recurrence of epigastric pain associated with nonbloody loose stools, abdominal bloating.  Patient reports that she has history of IBS, diagnosed early years ago and since her boyfriend passed away from pancreatic cancer, she has remained anxious and has been experiencing recurrence of epigastric pain associated with nonbloody loose stools for last few weeks.  Patient denies any weight loss.  Patient has history of iron deficiency anemia, underwent work-up including upper endoscopy as well as colonoscopy which were unremarkable.  She had normal video capsule endoscopy as well.  Her iron deficiency anemia has resolved.  Her iron levels have been normal.  Patient has been on long-term PPI omeprazole 40 mg twice daily for epigastric burning pain as well as dysphagia.  Follow-up visit 05/08/2023 Jocelyn Harris is here for follow-up of epigastric burning as well as postprandial abdominal cramps, urgency and diarrhea.  Patient states that her symptoms are affecting her daily life at work, she is a Scientist, physiological at hotel and has been experiencing abdominal cramps after she eats anything associated with urgency and loose stools.  She denies any particular relation to food.  She also burning in the stomach, has been taking omeprazole 40 mg once a day.  She reports burning at night sometimes.  Her stool studies are positive for E. coli past.  Pancreatic fecal elastase levels were normal.  H. pylori stool antigen was negative.  Iron deficiency anemia has  resolved Patient states that she was told that she has irritable bowel syndrome at her young age   Patient does not smoke or drink alcohol  NSAIDs: None  Antiplts/Anticoagulants/Anti thrombotics: None  GI Procedures:  EGD and colonoscopy 12/01/2020 - Salmon-colored mucosa suspicious for short-segment Barrett's esophagus. Biopsied. - Normal esophagus. Biopsied. - Multiple gastric polyps. Biopsied. - Normal stomach. Biopsied. - Normal duodenal bulb, second portion of the duodenum and examined duodenum. Biopsied. - Biopsies were obtained in the gastric body, at the incisura and in the gastric antrum.  - A single (solitary) ulcer in the terminal ileum. - Diverticulosis in the sigmoid colon. - The examination was otherwise normal. - The rectum, sigmoid colon, descending colon, transverse colon, ascending colon and cecum are normal. - Non-bleeding internal hemorrhoids. - No specimens collected.  DIAGNOSIS:  A.  DUODENUM; COLD BIOPSY:  - DUODENAL MUCOSA WITH INTACT VILLI.  - NEGATIVE FOR ACTIVE INFLAMMATION, INTRAEPITHELIAL LYMPHOCYTOSIS, AND  INFECTIOUS AGENTS.   B.  STOMACH; COLD BIOPSY:  - MILD NONSPECIFIC CHRONIC GASTRITIS AND REACTIVE FOVEOLAR HYPERPLASIA.  - PROTON PUMP INHIBITOR EFFECT.  - ONE COARSE MUCOSAL CALCIFICATION, POSSIBLE PILL FRAGMENT.  - NEGATIVE FOR ACTIVE INFLAMMATION, H. PYLORI, INTESTINAL METAPLASIA,  DYSPLASIA, AND MALIGNANCY.   C.  STOMACH POLYP; COLD BIOPSY:  - FUNDIC GLAND POLYP.  - NEGATIVE FOR DYSPLASIA AND MALIGNANCY.   D.  GASTROESOPHAGEAL JUNCTION; COLD BIOPSY:  - SQUAMOCOLUMNAR MUCOSA WITH MILD CHRONIC INFLAMMATION.  - NEGATIVE FOR GOBLET CELLS, DYSPLASIA, AND MALIGNANCY.   E.  ESOPHAGUS; COLD BIOPSY:  - STRATIFIED SQUAMOUS EPITHELIUM WITHOUT EOSINOPHILS, NEUTROPHILS, OR  REACTIVE CHANGES.  - NEGATIVE FOR DYSPLASIA AND MALIGNANCY. Past Medical History:  Diagnosis Date   Anemia    GERD (gastroesophageal reflux disease)    Hypertension     Iron deficiency anemia 01/17/2021   Positive fecal occult blood test    TMJ (dislocation of temporomandibular joint)     Past Surgical History:  Procedure Laterality Date   COLONOSCOPY WITH PROPOFOL N/A 12/01/2020   Procedure: COLONOSCOPY WITH PROPOFOL;  Surgeon: Pasty Spillers, MD;  Location: [facility];  Service: Endoscopy;  Laterality: N/A;   ESOPHAGOGASTRODUODENOSCOPY (EGD) WITH PROPOFOL N/A 12/01/2020   Procedure: ESOPHAGOGASTRODUODENOSCOPY (EGD) WITH PROPOFOL;  Surgeon: Pasty Spillers, MD;  Location: [facility];  Service: Endoscopy;  Laterality: N/A;   GIVENS CAPSULE STUDY N/A 03/07/2021   Procedure: GIVENS CAPSULE STUDY;  Surgeon: Pasty Spillers, MD;  Location: [facility] END[facility]COPY;  Service: Endoscopy;  Laterality: N/A;   KNEE SURGERY Right    TOE SURGERY     TUBAL LIGATION     Current Outpatient Medications:    azelastine (ASTELIN) 0.1 % nasal spray, Place 2 sprays into both nostrils 2 (two) times daily. Use in each nostril as directed, Disp: 30 mL, Rfl: 3   Biotin 1 MG CAPS, Take by mouth., Disp: , Rfl:    busPIRone (BUSPAR) 10 MG tablet, TAKE 1 TABLET(10 MG) BY MOUTH THREE TIMES DAILY, Disp: 90 tablet, Rfl: 3   calcium gluconate 500 MG tablet, Take 1 tablet by mouth daily. , Disp: , Rfl:    cetirizine (ZYRTEC) 10 MG tablet, TAKE 1 TABLET(10 MG) BY MOUTH DAILY, Disp: 90 tablet, Rfl: 3   Cholecalciferol 1000 UNITS capsule, Take 1,000 Units by mouth daily., Disp: , Rfl:    cyclobenzaprine (FLEXERIL) 10 MG tablet, Take 1 tablet (10 mg total) by mouth at bedtime as needed for muscle spasms., Disp: 30 tablet, Rfl: 1   fluticasone (FLONASE) 50 MCG/ACT nasal spray, SHAKE LIQUID AND USE 2 SPRAYS IN EACH NOSTRIL DAILY, Disp: 16 g, Rfl: 2   gabapentin (NEURONTIN) 100 MG capsule, TAKE 1 CAPSULE(100 MG) BY MOUTH THREE TIMES DAILY, Disp: 90 capsule, Rfl: 2   hydrochlorothiazide (HYDRODIURIL) 12.5 MG tablet, Take 12.5 mg by mouth daily., Disp: , Rfl:    lisinopril (ZESTRIL) 40  MG tablet, Take 1 tablet (40 mg total) by mouth daily., Disp: 90 tablet, Rfl: 1   montelukast (SINGULAIR) 10 MG tablet, TAKE 1 TABLET BY MOUTH EVERY NIGHT AT BEDTIME, Disp: 90 tablet, Rfl: 2   Multiple Vitamins-Minerals (HAIR SKIN AND NAILS FORMULA PO), Take by mouth., Disp: , Rfl:    omeprazole (PRILOSEC) 40 MG capsule, TAKE 1 CAPSULE(40 MG) BY MOUTH IN THE MORNING AND AT BEDTIME, Disp: 180 capsule, Rfl: 0   polyethylene glycol powder (GLYCOLAX/MIRALAX) 17 GM/SCOOP powder, Take 17 g by mouth daily., Disp: 507 g, Rfl: 1   rosuvastatin (CRESTOR) 5 MG tablet, Take 1 tablet (5 mg total) by mouth daily., Disp: 90 tablet, Rfl: 1   sucralfate (CARAFATE) 1 g tablet, Take 1 tablet (1 g total) by mouth 4 (four) times daily., Disp: 120 tablet, Rfl: 0   traZODone (DESYREL) 150 MG tablet, TAKE 1 TABLET(150 MG) BY MOUTH AT BEDTIME, Disp: 90 tablet, Rfl: 0   valACYclovir (VALTREX) 1000 MG tablet, Take 1 tablet by mouth 2 (two) times daily., Disp: , Rfl:    conjugated estrogens (PREMARIN) vaginal cream, Place 1 Applicatorful vaginally 2 (two) times a week. (Patient not taking: Reported on 05/08/2023), Disp: 42.5 g, Rfl: 4   Lysine 500  MG TABS, Take by mouth as needed. (Patient not taking: Reported on 05/08/2023), Disp: , Rfl:    MULTIPLE VITAMINS PO, Take by mouth daily. (Patient not taking: Reported on 05/08/2023), Disp: , Rfl:    Omega-3 Fatty Acids (FISH OIL) 1000 MG CAPS, Take by mouth daily. (Patient not taking: Reported on 05/08/2023), Disp: , Rfl:    triamterene-hydrochlorothiazide (DYAZIDE) 37.5-25 MG capsule, Take 1 each (1 capsule total) by mouth every morning. (Patient not taking: Reported on 05/08/2023), Disp: 30 capsule, Rfl: 11   vitamin E 100 UNIT capsule, Take 100 Units by mouth daily. (Patient not taking: Reported on 05/08/2023), Disp: , Rfl:   Family History  Problem Relation Age of Onset   Diabetes Mother    Hypertension Mother    Congestive Heart Failure Mother    Hypertension Father     Lung cancer Father    Hypertension Sister    Fibromyalgia Sister    Lupus Sister    Cancer Sister        of blood   Healthy Daughter    Leukemia Paternal Grandmother    Breast cancer Neg Hx      Social History   Tobacco Use   Smoking status: Never   Smokeless tobacco: Never  Vaping Use   Vaping status: Never Used  Substance Use Topics   Alcohol use: No    Alcohol/week: 0.0 standard drinks of alcohol   Drug use: No    Allergies as of 05/08/2023 - Review Complete 05/08/2023  Allergen Reaction Noted   Augmentin [amoxicillin-pot clavulanate] Nausea Only 06/07/2016   Doxycycline Nausea Only and Nausea And Vomiting 01/27/2015    Review of Systems:    All systems reviewed and negative except where noted in HPI.   Physical Exam:  BP (!) 173/84 (BP Location: Left Arm, Patient Position: Sitting, Cuff Size: Normal)   Pulse 67   Temp 98.3 F (36.8 C) (Oral)   Ht 5\' 1"  (1.549 m)   Wt 132 lb 2 oz (59.9 kg)   BMI 24.96 kg/m  No LMP recorded. Patient is postmenopausal.  General:   Alert,  Well-developed, well-nourished, pleasant and cooperative in NAD Head:  Normocephalic and atraumatic. Eyes:  Sclera clear, no icterus.   Conjunctiva pink. Ears:  Normal auditory acuity. Nose:  No deformity, discharge, or lesions. Mouth:  No deformity or lesions,oropharynx pink & moist. Neck:  Supple; no masses or thyromegaly. Lungs:  Respirations even and unlabored.  Clear throughout to auscultation.   No wheezes, crackles, or rhonchi. No acute distress. Heart:  Regular rate and rhythm; no murmurs, clicks, rubs, or gallops. Abdomen:  Normal bowel sounds. Soft, non-tender and non-distended without masses, hepatosplenomegaly or hernias noted.  No guarding or rebound tenderness.   Rectal: Not performed Msk:  Symmetrical without gross deformities. Good, equal movement & strength bilaterally. Pulses:  Normal pulses noted. Extremities:  No clubbing or edema.  No cyanosis. Neurologic:  Alert and  oriented x3;  grossly normal neurologically. Skin:  Intact without significant lesions or rashes. No jaundice. Psych:  Alert and cooperative. Normal mood and affect.  Imaging Studies: No abdominal imaging  Assessment and Plan:   SARAHY HILLEY is a 72 y.o. Caucasian female with history of chronic GERD, hypertension is seen in consultation for epigastric pain, worse postprandial associated with diarrhea and abdominal bloating.  Patient had an EGD and colonoscopy in 11/2020 which was unremarkable.  No evidence of H. pylori infection.  CT pancreas protocol showed normal pancreas, stool studies to rule  out infection, pancreatic fecal elastase levels were normal Her symptoms are most likely secondary to diarrhea predominant IBS Discussed about trial of hyoscyamine before each meal and at bedtime as needed Trial of IBgard and Restora samples  Chronic GERD Increase Prilosec to 40 mg twice daily before meals for 1 month only Discussed about antireflux lifestyle  Follow up as needed  [name], MD

## 2023-05-13 ENCOUNTER — Other Ambulatory Visit: Payer: Self-pay | Admitting: Gastroenterology

## 2023-05-13 NOTE — Telephone Encounter (Signed)
The patient called back to speak with Morrie Sheldon.

## 2023-05-13 NOTE — Telephone Encounter (Signed)
Last refill 05/08/2023 30 tab 0 refills  Last office visit 05/08/2023 IBS with diarrhea GERD  Plan:Discussed about trial of hyoscyamine before each meal and at bedtime as needed Trial of IBgard and Restora samples

## 2023-05-13 NOTE — Telephone Encounter (Signed)
Called and left a message for call back  

## 2023-05-13 NOTE — Telephone Encounter (Signed)
The patient called and left a voicemail stating she called back to speak to Jocelyn Harris. I called the patient back to inform him we received his message. There was no answer left patient a voicemail to call back.

## 2023-05-21 DIAGNOSIS — I10 Essential (primary) hypertension: Secondary | ICD-10-CM | POA: Diagnosis not present

## 2023-05-21 DIAGNOSIS — T50905A Adverse effect of unspecified drugs, medicaments and biological substances, initial encounter: Secondary | ICD-10-CM | POA: Diagnosis not present

## 2023-06-06 DIAGNOSIS — I10 Essential (primary) hypertension: Secondary | ICD-10-CM | POA: Diagnosis not present

## 2023-06-06 DIAGNOSIS — R5383 Other fatigue: Secondary | ICD-10-CM | POA: Diagnosis not present

## 2023-06-06 DIAGNOSIS — D509 Iron deficiency anemia, unspecified: Secondary | ICD-10-CM | POA: Diagnosis not present

## 2023-06-06 DIAGNOSIS — F419 Anxiety disorder, unspecified: Secondary | ICD-10-CM | POA: Diagnosis not present

## 2023-06-20 DIAGNOSIS — R5383 Other fatigue: Secondary | ICD-10-CM | POA: Diagnosis not present

## 2023-06-20 DIAGNOSIS — Z862 Personal history of diseases of the blood and blood-forming organs and certain disorders involving the immune mechanism: Secondary | ICD-10-CM | POA: Diagnosis not present

## 2023-06-20 DIAGNOSIS — I10 Essential (primary) hypertension: Secondary | ICD-10-CM | POA: Diagnosis not present

## 2023-06-20 DIAGNOSIS — F3341 Major depressive disorder, recurrent, in partial remission: Secondary | ICD-10-CM | POA: Diagnosis not present

## 2023-06-20 DIAGNOSIS — S39012A Strain of muscle, fascia and tendon of lower back, initial encounter: Secondary | ICD-10-CM | POA: Diagnosis not present

## 2023-06-20 DIAGNOSIS — F419 Anxiety disorder, unspecified: Secondary | ICD-10-CM | POA: Diagnosis not present

## 2023-07-12 DIAGNOSIS — J101 Influenza due to other identified influenza virus with other respiratory manifestations: Secondary | ICD-10-CM | POA: Diagnosis not present

## 2023-07-12 DIAGNOSIS — J069 Acute upper respiratory infection, unspecified: Secondary | ICD-10-CM | POA: Diagnosis not present

## 2023-07-18 DIAGNOSIS — I10 Essential (primary) hypertension: Secondary | ICD-10-CM | POA: Diagnosis not present

## 2023-07-18 DIAGNOSIS — J301 Allergic rhinitis due to pollen: Secondary | ICD-10-CM | POA: Diagnosis not present

## 2023-07-18 DIAGNOSIS — F3341 Major depressive disorder, recurrent, in partial remission: Secondary | ICD-10-CM | POA: Diagnosis not present

## 2023-07-18 DIAGNOSIS — R051 Acute cough: Secondary | ICD-10-CM | POA: Diagnosis not present

## 2023-07-18 DIAGNOSIS — K580 Irritable bowel syndrome with diarrhea: Secondary | ICD-10-CM | POA: Diagnosis not present

## 2023-08-15 DIAGNOSIS — R051 Acute cough: Secondary | ICD-10-CM | POA: Diagnosis not present

## 2023-08-15 DIAGNOSIS — L989 Disorder of the skin and subcutaneous tissue, unspecified: Secondary | ICD-10-CM | POA: Diagnosis not present

## 2023-08-15 DIAGNOSIS — R053 Chronic cough: Secondary | ICD-10-CM | POA: Diagnosis not present

## 2023-08-15 DIAGNOSIS — M545 Low back pain, unspecified: Secondary | ICD-10-CM | POA: Diagnosis not present

## 2023-08-15 DIAGNOSIS — G8929 Other chronic pain: Secondary | ICD-10-CM | POA: Diagnosis not present

## 2023-08-15 DIAGNOSIS — J301 Allergic rhinitis due to pollen: Secondary | ICD-10-CM | POA: Diagnosis not present

## 2023-09-26 DIAGNOSIS — I10 Essential (primary) hypertension: Secondary | ICD-10-CM | POA: Diagnosis not present

## 2023-09-26 DIAGNOSIS — M545 Low back pain, unspecified: Secondary | ICD-10-CM | POA: Diagnosis not present

## 2023-09-26 DIAGNOSIS — G629 Polyneuropathy, unspecified: Secondary | ICD-10-CM | POA: Diagnosis not present

## 2023-09-26 DIAGNOSIS — R5383 Other fatigue: Secondary | ICD-10-CM | POA: Diagnosis not present

## 2023-09-26 DIAGNOSIS — G8929 Other chronic pain: Secondary | ICD-10-CM | POA: Diagnosis not present

## 2023-10-24 DIAGNOSIS — D045 Carcinoma in situ of skin of trunk: Secondary | ICD-10-CM | POA: Diagnosis not present

## 2023-10-24 DIAGNOSIS — D2272 Melanocytic nevi of left lower limb, including hip: Secondary | ICD-10-CM | POA: Diagnosis not present

## 2023-10-24 DIAGNOSIS — D485 Neoplasm of uncertain behavior of skin: Secondary | ICD-10-CM | POA: Diagnosis not present

## 2023-10-24 DIAGNOSIS — D2261 Melanocytic nevi of right upper limb, including shoulder: Secondary | ICD-10-CM | POA: Diagnosis not present

## 2023-10-24 DIAGNOSIS — D225 Melanocytic nevi of trunk: Secondary | ICD-10-CM | POA: Diagnosis not present

## 2023-10-24 DIAGNOSIS — D2271 Melanocytic nevi of right lower limb, including hip: Secondary | ICD-10-CM | POA: Diagnosis not present

## 2023-10-24 DIAGNOSIS — D2262 Melanocytic nevi of left upper limb, including shoulder: Secondary | ICD-10-CM | POA: Diagnosis not present

## 2023-10-24 DIAGNOSIS — L821 Other seborrheic keratosis: Secondary | ICD-10-CM | POA: Diagnosis not present

## 2023-10-31 DIAGNOSIS — K580 Irritable bowel syndrome with diarrhea: Secondary | ICD-10-CM | POA: Diagnosis not present

## 2023-10-31 DIAGNOSIS — G629 Polyneuropathy, unspecified: Secondary | ICD-10-CM | POA: Diagnosis not present

## 2023-11-14 DIAGNOSIS — L559 Sunburn, unspecified: Secondary | ICD-10-CM | POA: Diagnosis not present

## 2023-11-14 DIAGNOSIS — R051 Acute cough: Secondary | ICD-10-CM | POA: Diagnosis not present

## 2023-11-14 DIAGNOSIS — I10 Essential (primary) hypertension: Secondary | ICD-10-CM | POA: Diagnosis not present

## 2023-11-26 DIAGNOSIS — D045 Carcinoma in situ of skin of trunk: Secondary | ICD-10-CM | POA: Diagnosis not present

## 2024-01-02 DIAGNOSIS — M25549 Pain in joints of unspecified hand: Secondary | ICD-10-CM | POA: Diagnosis not present

## 2024-01-02 DIAGNOSIS — G8929 Other chronic pain: Secondary | ICD-10-CM | POA: Diagnosis not present

## 2024-01-02 DIAGNOSIS — R5383 Other fatigue: Secondary | ICD-10-CM | POA: Diagnosis not present

## 2024-01-02 DIAGNOSIS — I10 Essential (primary) hypertension: Secondary | ICD-10-CM | POA: Diagnosis not present

## 2024-01-23 DIAGNOSIS — Z1231 Encounter for screening mammogram for malignant neoplasm of breast: Secondary | ICD-10-CM | POA: Diagnosis not present

## 2024-01-23 DIAGNOSIS — Z0000 Encounter for general adult medical examination without abnormal findings: Secondary | ICD-10-CM | POA: Diagnosis not present

## 2024-01-23 DIAGNOSIS — M79641 Pain in right hand: Secondary | ICD-10-CM | POA: Diagnosis not present

## 2024-01-23 DIAGNOSIS — Z1331 Encounter for screening for depression: Secondary | ICD-10-CM | POA: Diagnosis not present

## 2024-01-23 DIAGNOSIS — M5136 Other intervertebral disc degeneration, lumbar region with discogenic back pain only: Secondary | ICD-10-CM | POA: Diagnosis not present

## 2024-01-23 DIAGNOSIS — Z1159 Encounter for screening for other viral diseases: Secondary | ICD-10-CM | POA: Diagnosis not present

## 2024-01-23 DIAGNOSIS — F3341 Major depressive disorder, recurrent, in partial remission: Secondary | ICD-10-CM | POA: Diagnosis not present

## 2024-01-23 DIAGNOSIS — M159 Polyosteoarthritis, unspecified: Secondary | ICD-10-CM | POA: Diagnosis not present

## 2024-01-23 DIAGNOSIS — I10 Essential (primary) hypertension: Secondary | ICD-10-CM | POA: Diagnosis not present

## 2024-01-23 DIAGNOSIS — F419 Anxiety disorder, unspecified: Secondary | ICD-10-CM | POA: Diagnosis not present

## 2024-01-23 DIAGNOSIS — M545 Low back pain, unspecified: Secondary | ICD-10-CM | POA: Diagnosis not present

## 2024-03-05 DIAGNOSIS — J329 Chronic sinusitis, unspecified: Secondary | ICD-10-CM | POA: Diagnosis not present

## 2024-03-05 DIAGNOSIS — Z03818 Encounter for observation for suspected exposure to other biological agents ruled out: Secondary | ICD-10-CM | POA: Diagnosis not present

## 2024-03-05 DIAGNOSIS — J069 Acute upper respiratory infection, unspecified: Secondary | ICD-10-CM | POA: Diagnosis not present

## 2024-03-12 DIAGNOSIS — Z1231 Encounter for screening mammogram for malignant neoplasm of breast: Secondary | ICD-10-CM | POA: Diagnosis not present

## 2024-03-12 DIAGNOSIS — J0100 Acute maxillary sinusitis, unspecified: Secondary | ICD-10-CM | POA: Diagnosis not present

## 2024-03-12 DIAGNOSIS — E2839 Other primary ovarian failure: Secondary | ICD-10-CM | POA: Diagnosis not present

## 2024-03-12 DIAGNOSIS — Z1159 Encounter for screening for other viral diseases: Secondary | ICD-10-CM | POA: Diagnosis not present

## 2024-03-12 DIAGNOSIS — R42 Dizziness and giddiness: Secondary | ICD-10-CM | POA: Diagnosis not present

## 2024-03-16 ENCOUNTER — Other Ambulatory Visit: Payer: Self-pay | Admitting: Family Medicine

## 2024-03-16 DIAGNOSIS — Z1231 Encounter for screening mammogram for malignant neoplasm of breast: Secondary | ICD-10-CM

## 2024-03-26 DIAGNOSIS — M810 Age-related osteoporosis without current pathological fracture: Secondary | ICD-10-CM | POA: Diagnosis not present

## 2024-05-21 DIAGNOSIS — H6992 Unspecified Eustachian tube disorder, left ear: Secondary | ICD-10-CM | POA: Diagnosis not present

## 2024-05-21 DIAGNOSIS — M545 Low back pain, unspecified: Secondary | ICD-10-CM | POA: Diagnosis not present

## 2024-05-21 DIAGNOSIS — G8929 Other chronic pain: Secondary | ICD-10-CM | POA: Diagnosis not present

## 2024-05-21 DIAGNOSIS — M810 Age-related osteoporosis without current pathological fracture: Secondary | ICD-10-CM | POA: Diagnosis not present

## 2024-06-12 NOTE — Progress Notes (Signed)
 Jocelyn Harris is a 73 y.o. female that comes today for the following problem(s):   Chief Complaint  Patient presents with   Hypertension    BP has been elevated for a few days.     HPI: History of Present Illness Jocelyn Harris is a 73 year old female with hypertension who presents with elevated blood pressure and associated symptoms.  Her blood pressure was 140 mmHg last week and increased to 158 mmHg yesterday. She notes that her blood pressure tends to rise when she experiences back pain, which she is currently experiencing. She also has headaches and a sensation of imbalance, describing her balance as 'off pretty bad'.  She is currently taking losartan for her blood pressure. She has not been taking her triamterene /hydrochlorothiazide  regularly, only using it when she feels she has fluid retention in her feet. She is unsure about her use of amlodipine , stating she does not remember taking it regularly. She has been using ibuprofen  for back pain.  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 She has been experiencing ear pain and balance issues. She takes Zyrtec  daily for allergies.    Patient Active Problem List  Diagnosis   Ulceration of intestine   Recurrent major depressive disorder, in partial remission ()   Pap smear abnormality of cervix/human papillomavirus (HPV) positive   Melena   Iron  deficiency anemia   Incontinence of bowel   Hypertension   HLD (hyperlipidemia)   Fatigue   Eustachian tube disorder, left   Columnar epithelial-lined lower esophagus   Cannot sleep   Basal cell papilloma   Back pain, chronic   Anxiety   Acid reflux   Allergic rhinitis     No past medical history on file.   Past Surgical History:  Procedure Laterality Date   COLONOSCOPY  02/04/2014   Normal--Repeat [place] yrs.---[place]   [facility]  [facility]/04/2014 [facility]   [facility]peat 10 Years/ [facility]    Social History    Socioeconomic History   Marital status: Divorced  Tobacco Use   Smoking status: Never   Smokeless tobacco: Never  Substance and Sexual Activity   Alcohol use: Yes   Drug use: Never  Social History Narrative   ** Merged History Encounter **       Social Drivers of Health   Financial Resource Strain: Low Risk  (01/23/2024)   Overall Financial Resource Strain (CARDIA)    Difficulty of Paying Living Expenses: Not hard at all  Food Insecurity: No Food Insecurity (01/23/2024)   Hunger Vital Sign    Worried About Running Out of Food in the Last Year: Never true    Ran Out of Food in the Last Year: Never true  Transportation Needs: No Transportation Needs (01/23/2024)   PRAPARE - Administrator, Civil Service (Medical): No    Lack of Transportation (Non-Medical): No  Physical Activity: Insufficiently Active (03/12/2024)   Exercise Vital Sign    Days of Exercise per Week: 2 days    Minutes of Exercise per Session: 30 min  Stress: No Stress Concern Present (01/03/2022)   Received from [facility] Occupational [facility] - Occupational Stress Questionnaire    Feeling of Stress : Not at all  Social Connections: Moderately Integrated (01/03/2022)   Received from [facility]   Social Connection and Isolation Panel    In a typical week, how many times do you talk on  the phone with family, friends, or neighbors?: More than three times a week    How often do you get together with friends or relatives?: Once a week    How often do you attend church or religious services?: More than 4 times per year    Do you belong to any clubs or organizations such as church groups, unions, fraternal or athletic groups, or school groups?: Yes    How often do you attend meetings of the clubs or organizations you belong to?: Never    Are you married, widowed, divorced, separated, never married, or living with a partner?: Divorced  Housing Stability: Low Risk   (03/26/2024)   Housing Stability Vital Sign    Unable to Pay for Housing in the Last Year: No    Number of Times Moved in the Last Year: 0    Homeless in the Last Year: No     No family history on file.    Current Outpatient Medications:    alendronate (FOSAMAX) 70 MG tablet, Take 1 tablet (70 mg total) by mouth every 7 (seven) days, Disp: 12 tablet, Rfl: 3   amLODIPine  (NORVASC ) 5 MG tablet, Take 1 tablet (5 mg total) by mouth once daily, Disp: 30 tablet, Rfl: 11   buPROPion (WELLBUTRIN) 100 MG tablet, Take 100 mg by mouth 3 (three) times daily, Disp: , Rfl:    busPIRone  (BUSPAR ) 10 MG tablet, Take 1 tablet (10 mg total) by mouth 3 (three) times daily, Disp: 90 tablet, Rfl: 5   CA COMB NO.1/VIT D3/B-6/FA/B12 (VITAMIN D3, CALCIUM  CIT-PHOS, ORAL), Take by mouth once daily., Disp: , Rfl:    cetirizine  (ZYRTEC ) 10 MG tablet, Take 1 tablet (10 mg total) by mouth once daily, Disp: 90 tablet, Rfl: 1   cholecalciferol (VITAMIN D3) 1000 unit capsule, Take 1,000 Units by mouth once daily, Disp: , Rfl:    famotidine (PEPCID) 40 MG tablet, Take 1 tablet (40 mg total) by mouth at bedtime, Disp: 30 tablet, Rfl: 11   fexofenadine  (MUCINEX ALLERGY) 180 MG tablet, Take 180 mg by mouth once daily as needed., Disp: , Rfl:    fluticasone  propionate (FLONASE ) 50 mcg/actuation nasal spray, Place 2 sprays into both nostrils once daily, Disp: 16 g, Rfl: 11   gabapentin  (NEURONTIN ) 100 MG capsule, Take 1 capsule (100 mg total) by mouth 3 (three) times daily for 180 days Pt is Taking 2 at Night, Disp: 270 capsule, Rfl: 1   ibuprofen  (MOTRIN ) 800 MG tablet, Take 1 tablet (800 mg total) by mouth 2 (two) times daily as needed for Pain, Disp: 60 tablet, Rfl: 3   losartan (COZAAR) 50 MG tablet, Take 1 tablet (50 mg total) by mouth once daily, Disp: 30 tablet, Rfl: 11   montelukast  (SINGULAIR ) 10 mg tablet, Take 1 tablet (10 mg total) by mouth at bedtime, Disp: 90 tablet, Rfl: 0   multivitamin capsule,  Take 1 capsule by mouth once daily, Disp: , Rfl:    omeprazole  (PRILOSEC) 40 MG DR capsule, Take 1 capsule (40 mg total) by mouth once daily, Disp: 90 capsule, Rfl: 3   rosuvastatin  (CRESTOR ) 5 MG tablet, Take 1 tablet (5 mg total) by mouth once daily, Disp: 90 tablet, Rfl: 3   sucralfate  (CARAFATE ) 1 gram tablet, Take 1 tablet (1 g total) by mouth 4 (four) times daily, Disp: 360 tablet, Rfl: 3   traZODone  (DESYREL ) 150 MG tablet, TAKE 1 TABLET(150 MG) BY MOUTH AT BEDTIME, Disp: 90 tablet, Rfl: 1   triamterene -hydroCHLOROthiazide  (DYAZIDE)  37.5-25 mg capsule, Take 1 capsule by mouth every morning, Disp: 30 capsule, Rfl: 11   valACYclovir  (VALTREX ) 1000 MG tablet, Take 1 tablet (1,000 mg total) by mouth 2 (two) times daily, Disp: 14 tablet, Rfl: 11   carvedilol (COREG) 6.25 MG tablet, Take 1 tablet (6.25 mg total) by mouth 2 (two) times daily with meals for 30 days, Disp: 60 tablet, Rfl: 0   cholecalciferol, vitamin D3, (VITAMIN D3) 125 mcg (5,000 unit) tablet, Take 5,000 Units by mouth once daily (Patient not taking: Reported on 06/12/2024), Disp: , Rfl:    predniSONE  (DELTASONE ) 20 MG tablet, Take 1 tablet (20 mg total) by mouth once daily (Patient not taking: Reported on 06/12/2024), Disp: 7 tablet, Rfl: 0   ROS: - Persistently elevated blood pressure at home, averaging in 140s-160s systolic.   Objective:  BP (!) 182/94 (BP Location: Left upper arm, Patient Position: Sitting, BP Cuff Size: Adult)   Pulse 76   Ht 152.4 cm (5')   Wt 60.8 kg (134 lb)   SpO2 98%   BMI 26.17 kg/m   Physical Exam HEENT: Ears normal, no cerumen    Physical Examination:  GENERAL: Elderly lady, very anxious at baseline, fully alert, oriented and in no acute distress.  HEENT:  NCAT EOMI  LUNGS:  breathing comfortably on RA CARDIAC:  Regular rate  EXTREMITIES:  Full range of motion with no erythema, heat or effusion.  No cyanosis, clubbing or edema noted.  NEUROLOGIC: The patient is alert and  oriented.  Moves all extremities equally     A/P Assessment & Plan Primary hypertension Blood pressure readings are elevated, with recent measurements of 140/90 mmHg and 158/90 mmHg at home. BP even more elevated in clinic today. Current medications include losartan 50mg  daily. She is not sure if she's still taking amlodipine ,  She also said she takes her .triamterene -HCTZ only as needed when she has leg swelling, so this is probably not helping in BP control - Started carvedilol 1 tablet (6.25) twice daily. - Instructed to monitor blood pressure regularly. - Follow up with PCP on January 15th visit  Acute on chronic low back pain Persistent low back pain might be contributing to elevated blood pressure. Currently using Tylenol and ibuprofen  for pain management.  - Continue ibuprofen  as needed for pain management.    No orders of the defined types were placed in this encounter.    Diagnoses and all orders for this visit:  Primary hypertension  Acute midline low back pain without sciatica  Other orders -     carvedilol (COREG) 6.25 MG tablet; Take 1 tablet (6.25 mg total) by mouth 2 (two) times daily with meals for 30 days     I personally spent 30 minutes face-to-face and non-face-to-face in the care of this patient, which includes all pre, intra, and post visit time on the date of service.  Doretta Urbano Button, MD  This note has been created using automated tools and reviewed for accuracy by ULYSSES NAJIN TOCHE.

## 2024-06-27 ENCOUNTER — Emergency Department: Admission: EM | Admit: 2024-06-27 | Discharge: 2024-06-27 | Disposition: A | Discharge status: Home / Self Care

## 2024-06-27 ENCOUNTER — Other Ambulatory Visit: Payer: Self-pay

## 2024-06-27 ENCOUNTER — Emergency Department

## 2024-06-27 DIAGNOSIS — I160 Hypertensive urgency: Secondary | ICD-10-CM | POA: Diagnosis not present

## 2024-06-27 DIAGNOSIS — R42 Dizziness and giddiness: Secondary | ICD-10-CM | POA: Diagnosis present

## 2024-06-27 LAB — COMPREHENSIVE METABOLIC PANEL WITH GFR
ALT: 14 U/L (ref 0–44)
AST: 16 U/L (ref 15–41)
Albumin: 4.3 g/dL (ref 3.5–5.0)
Alkaline Phosphatase: 75 U/L (ref 38–126)
Anion gap: 8 (ref 5–15)
BUN: 15 mg/dL (ref 8–23)
CO2: 26 mmol/L (ref 22–32)
Calcium: 9.1 mg/dL (ref 8.9–10.3)
Chloride: 110 mmol/L (ref 98–111)
Creatinine, Ser: 0.86 mg/dL (ref 0.44–1.00)
GFR, Estimated: >60 mL/min
Glucose, Bld: 93 mg/dL (ref 70–99)
Potassium: 4.3 mmol/L (ref 3.5–5.1)
Sodium: 143 mmol/L (ref 135–145)
Total Bilirubin: 0.4 mg/dL (ref 0.0–1.2)
Total Protein: 6.5 g/dL (ref 6.5–8.1)

## 2024-06-27 LAB — CBC
HCT: 39.9 % (ref 36.0–46.0)
Hemoglobin: 12.7 g/dL (ref 12.0–15.0)
MCH: 29.1 pg (ref 26.0–34.0)
MCHC: 31.8 g/dL (ref 30.0–36.0)
MCV: 91.5 fL (ref 80.0–100.0)
Platelets: 246 K/uL (ref 150–400)
RBC: 4.36 MIL/uL (ref 3.87–5.11)
RDW: 12.4 % (ref 11.5–15.5)
WBC: 8 K/uL (ref 4.0–10.5)
nRBC: 0 % (ref 0.0–0.2)

## 2024-06-27 LAB — TROPONIN T, HIGH SENSITIVITY: Troponin T High Sensitivity: <15 ng/L (ref 0–19)

## 2024-06-27 MED ORDER — HYDRALAZINE HCL 20 MG/ML IJ SOLN
10.0000 mg | Freq: Once | INTRAMUSCULAR | Status: AC
  Administered 2024-06-27: 10 mg via INTRAVENOUS
  Filled 2024-06-27: qty 1

## 2024-06-27 MED ORDER — IRBESARTAN 150 MG PO TABS
150.0000 mg | ORAL_TABLET | Freq: Every day | ORAL | Status: DC
  Administered 2024-06-27: 150 mg via ORAL
  Filled 2024-06-27: qty 1

## 2024-06-27 MED ORDER — VALSARTAN 160 MG PO TABS: 160.0000 mg | ORAL_TABLET | Freq: Every day | ORAL | 0 refills | Status: AC

## 2024-06-27 NOTE — Discharge Instructions (Signed)
 You were seen today due to concern of elevated blood pressure.  At this time fortunately her blood pressure has improved.  I strongly encourage you to follow-up with your primary doctor as you have planned on Thursday.  If you have any worsening of symptoms such as numbness, tingling, change in vision, chest pain, or any other symptoms you find concerning please return to the emergency department immediately for further medical management.  I have written for a new blood pressure medication for you to start taking, please take this as instructed.  You should stop taking your home losartan at this time.

## 2024-06-27 NOTE — ED Triage Notes (Addendum)
 Pt to ED via POV from home. Pt reports HA, dizziness and Htn for the last few days. Pt reports systolic readings in the 200s at home. Pt reports recent changes to BP meds.

## 2024-06-27 NOTE — ED Provider Notes (Signed)
 "  [facility] Provider Note    Event Date/Time   First MD Initiated Contact with Patient 06/27/24 1147     (approximate)   History   Hypertension   HPI  Jocelyn Harris is a 74 y.o. female presenting today with fogginess lightheadedness and visual changes.  Patient informs me that she has been having some issues with her blood pressure.  Was recently seen by her primary care team, and they started her on carvedilol.  She states over the last few days she noticed that her blood pressure was elevated again, she started having some lightheadedness and difficulty with concentration and she also noticed some visual changes.  She was monitoring her blood pressure at home and found it to be in the 180s, given the symptoms she decided to come in for further assessment and evaluation.  Denies chest pain or shortness of breath affiliated with her symptoms she states that she is able to ambulate without significant restrictions.  She is apparently on 50 mg losartan, HCTZ which she takes as needed, and now on carvedilol.  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     Physical Exam   Triage Vital Signs: ED Triage Vitals  Encounter Vitals Group     BP 06/27/24 1020 (!) 187/96     Girls Systolic BP Percentile --      Girls Diastolic BP Percentile --      Boys Systolic BP Percentile --      Boys Diastolic BP Percentile --      Pulse Rate 06/27/24 1020 (!) 56     Resp 06/27/24 1020 18     Temp 06/27/24 1026 98 F (36.7 C)     Temp Source 06/27/24 1026 Oral     SpO2 06/27/24 1020 100 %     Weight --      Height --      Head Circumference --      Peak Flow --      Pain Score 06/27/24 1021 6     Pain Loc --      Pain Education --      Exclude from Growth Chart --      Most recent vital signs: Vitals:   06/27/24 1403 06/27/24 1408  BP: (!) 205/84 (!) 159/68  Pulse:  60  Resp:  17  Temp:    SpO2:  100%     General: Awake, no distress.  CV:  Good peripheral perfusion.  Resp:  Normal effort.  Abd:  No distention.  Soft nontender Neuro:  Alert and oriented x 3, cranial nerves II to XII are intact appropriate visual fields, normal sensation strength coordination in all extremities Other:     ED Results / Procedures / Treatments   Labs (all labs ordered are listed, but only abnormal results are displayed) Labs Reviewed  COMPREHENSIVE METABOLIC PANEL WITH GFR  CBC  TROPONIN T, HIGH SENSITIVITY     EKG  My independent interpretation this appears to be a sinus rhythm at rate of about 50, axis of about 45, intervals appear to be within normal limits, no obvious ischemia that I appreciate this EKG   RADIOLOGY   PROCEDURES:  Critical Care performed: No  Procedures   MEDICATIONS ORDERED IN ED:  Medications  irbesartan  (AVAPRO ) tablet 150 mg (150 mg Oral Given 06/27/24 1242)  hydrALAZINE  (APRESOLINE ) injection 10 mg (10 mg Intravenous Given 06/27/24 1403)     IMPRESSION / MDM / ASSESSMENT AND PLAN / ED COURSE  I reviewed the triage vital signs and the nursing notes.                               Patient's presentation is most consistent with acute presentation with potential threat to life or bodily function.  74 year old female presents today with concern of hypertension.  Complaining of some visual blurriness and changes, concern for hypertensive emergency versus urgency, fortunately EKG is reassuring and labs without acute findings with appropriate renal function.  I am adding on a troponin, given her complaints of visual changes will also obtain CT imaging of the head and give a dose of antihypertensive and reassess.  I did discuss with pharmacy in regards to what would be most appropriate for managing the patient's blood  pressure given her current regiment and also the fact that she has had issues with amlodipine  as well as diuretics, recommending valsartan  for home, unfortunately we do not have it on her formulary here so we will give an alternative now.   Clinical Course as of 06/27/24 1521  Sat Jun 27, 2024  1332 Patient continues to be hypertensive, will give a dose of hydralazine  here and continue assess and monitor. [SK]  1419 Patient blood pressure significantly improved at this time and she feels much better.  I discussed with her return precautions I did consider admission however given the significant improvement in symptoms and the plan for close follow-up and new antihypertensive I believe reasonable for discharge home.  I discussed return precautions, patient verbalizes understanding and is agreeable with the plan. [SK]    Clinical Course User Index [SK] Fernand Rossie HERO, MD     FINAL CLINICAL IMPRESSION(S) / ED DIAGNOSES   Final diagnoses:  Hypertensive urgency     Rx / DC Orders   ED Discharge Orders          Ordered    valsartan  (DIOVAN ) 160 MG tablet  Daily        06/27/24 1420             Note:  This document was prepared using Dragon voice recognition software and may include unintentional dictation errors.   Fernand Rossie HERO, MD 06/27/24 1521  "

## 2024-07-16 NOTE — Progress Notes (Signed)
 [facility]  Chief complaint: Follow-up   Subjective: Jocelyn Harris is a 74 y.o. female here for recheck. History of Present Illness Jocelyn Harris is a 74 year old female with hypertension and TMJ who presents with persistent headaches and blood pressure concerns.  She has been experiencing fluctuating blood pressure readings, with a recent measurement of 166/72 mmHg, which is lower than a previous reading of 200/100 mmHg. She is concerned about the accuracy of her home blood pressure monitor.  She continues to experience persistent headaches, described as occurring in the occipital region and sometimes in the neck area. She took Tylenol prior to the visit but still experiences headaches. She suspects that her headaches may be related to her TMJ, as she experiences sharp pain in her face and ears when clenching her teeth.  She has a history of TMJ, which was diagnosed years ago. She experiences pain in her jaw, particularly on one side, and sometimes wakes up with headaches. She has previously used a night guard, which helped alleviate her symptoms, but has not used it recently. No teeth grinding, but she is concerned about clenching them.  She reports weight gain, currently weighing 133 pounds, which she attributes to holiday eating. She wants to return to a weight in the 120s and plans to resume physical activity, such as walking on a treadmill, once the weather improves.  She mentions a history of vertigo and ongoing balance issues, and reports ongoing sinus problems. She has an upcoming appointment with an ear, nose, and throat specialist to evaluate her sinus issues.  She reports pain in her big toe, where she previously had bone spurs and toenails removed. The pain is exacerbated by wearing shoes, even soft ones.    Current Outpatient Medications:    alendronate (FOSAMAX) 70 MG tablet, Take 1 tablet (70 mg total) by mouth every 7 (seven) days, Disp: 12 tablet, Rfl:  3   buPROPion (WELLBUTRIN) 100 MG tablet, Take 100 mg by mouth 3 (three) times daily, Disp: , Rfl:    busPIRone  (BUSPAR ) 10 MG tablet, Take 1 tablet (10 mg total) by mouth 3 (three) times daily, Disp: 90 tablet, Rfl: 5   CA COMB NO.1/VIT D3/B-6/FA/B12 (VITAMIN D3, CALCIUM  CIT-PHOS, ORAL), Take by mouth once daily., Disp: , Rfl:    carvedilol (COREG) 6.25 MG tablet, Take 1 tablet (6.25 mg total) by mouth 2 (two) times daily with meals for 30 days, Disp: 60 tablet, Rfl: 11   cetirizine  (ZYRTEC ) 10 MG tablet, Take 1 tablet (10 mg total) by mouth once daily, Disp: 90 tablet, Rfl: 1   cholecalciferol (VITAMIN D3) 1000 unit capsule, Take 1,000 Units by mouth once daily, Disp: , Rfl:    famotidine (PEPCID) 40 MG tablet, TAKE 1 TABLET(40 MG) BY MOUTH AT BEDTIME, Disp: 90 tablet, Rfl: 1   fexofenadine  (MUCINEX ALLERGY) 180 MG tablet, Take 180 mg by mouth once daily as needed., Disp: , Rfl:    fluticasone  propionate (FLONASE ) 50 mcg/actuation nasal spray, Place 2 sprays into both nostrils once daily, Disp: 16 g, Rfl: 11   gabapentin  (NEURONTIN ) 100 MG capsule, Take 1 capsule (100 mg total) by mouth 3 (three) times daily for 180 days Pt is Taking 2 at Night, Disp: 270 capsule, Rfl: 1   hydrALAZINE  (APRESOLINE ) 25 MG tablet, Take 1 tablet (25 mg total) by mouth 2 (two) times daily, Disp: 60 tablet, Rfl: 11   ibuprofen  (MOTRIN ) 800 MG tablet, Take 1 tablet (800 mg total) by mouth  2 (two) times daily as needed for Pain, Disp: 60 tablet, Rfl: 3   montelukast  (SINGULAIR ) 10 mg tablet, Take 1 tablet (10 mg total) by mouth at bedtime, Disp: 90 tablet, Rfl: 0   multivitamin capsule, Take 1 capsule by mouth once daily, Disp: , Rfl:    omeprazole  (PRILOSEC) 40 MG DR capsule, Take 1 capsule (40 mg total) by mouth once daily, Disp: 90 capsule, Rfl: 3   rosuvastatin  (CRESTOR ) 5 MG tablet, Take 1 tablet (5 mg total) by mouth once daily, Disp: 90 tablet, Rfl: 3   sucralfate  (CARAFATE ) 1 gram tablet, Take 1  tablet (1 g total) by mouth 4 (four) times daily, Disp: 360 tablet, Rfl: 3   traZODone  (DESYREL ) 150 MG tablet, TAKE 1 TABLET(150 MG) BY MOUTH AT BEDTIME, Disp: 90 tablet, Rfl: 1   triamterene -hydroCHLOROthiazide  (DYAZIDE) 37.5-25 mg capsule, Take 1 capsule by mouth every morning, Disp: 30 capsule, Rfl: 11   valACYclovir  (VALTREX ) 1000 MG tablet, Take 1 tablet (1,000 mg total) by mouth 2 (two) times daily, Disp: 14 tablet, Rfl: 11   valsartan  (DIOVAN ) 160 MG tablet, Take 1 tablet (160 mg total) by mouth once daily, Disp: 30 tablet, Rfl: 11   cholecalciferol, vitamin D3, (VITAMIN D3) 125 mcg (5,000 unit) tablet, Take 5,000 Units by mouth once daily (Patient not taking: Reported on 06/12/2024), Disp: , Rfl:   ROS reviewed: General ROS:  Negative for  - fatigue, fevers, chills HEENT ROS: negative for seasonal allergies, congestion, cough Respiratory ROS: negative for - cough, SOB, wheezing Cardiovascular ROS: no chest pain or dyspnea on exertion Gastrointestinal ROS: negative for constipation, N/V,D Genito-Urinary ROS: no dysuria, trouble voiding, or hematuria Musculoskeletal ROS: negative for joint pain, swelling Neurological ROS: negative for headaches, dizzinesss Dermatological ROS: negative for rash or skin lesion    Psychological ROS: negative for depression or anxiety  Allergies  Allergen Reactions   Amoxicillin -Pot Clavulanate Nausea    Patient reports that her reflux was bad when she took medication   Doxycycline Nausea and Vomiting    Social History   Tobacco Use   Smoking status: Never   Smokeless tobacco: Never  Substance Use Topics   Alcohol use: Yes   Drug use: Never      Objective:  Ht 152.4 cm (5')   BMI 25.97 kg/m  reviewed. Gen: AAOx3. Well-developed and well-nourished. NAD.  HEENT:    HEAD NORMOCEPHALIC.  PERRLA, EOM intact.  No thyromegaly present. No lymphadpathy. Cardiovascular: Normal rate, regular rhythm. Normal S1 and S2 without murmus, rubs or  gallops.  Pulmonary/Chest: CTAB. Effort normal and breath sounds normal. No respiratory distress. No wheezes or rales. Abdomen: Soft. Bowel sounds are normal. No distension or tenderness.  Neuro: Cranial nervess II-XII intact.Nonfocal exam. Skin: Skin is warm and dry.  Musculoskeletal: Normal range of motion. No edema, no tenderness.  Psychiatric: Normal mood and affect. Her behavior is normal. 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 Pain in occipital region and TMJ, sharp with jaw clenching. Differential includes cervicogenic headache and TMJ disorder. Night guard previously relieved TMJ-related headaches. - Ordered x-ray of neck for cervicogenic headache evaluation. - Prescribed muscle relaxant for tension headaches, to be taken at night. - Advised use of night guard for TMJ-related headaches. - Encouraged physical therapy for back pain, which may also alleviate headaches.  Primary hypertension Blood pressure improved but elevated at 166/72 mmHg. Previous readings higher. Plans to  bring home blood pressure cuff for calibration. - Bring home blood pressure cuff to next appointment for calibration. - Scheduled follow-up in one month to reassess blood pressure.  Acute low back pain Chronic low back pain. Pain may contribute to headaches. Physical therapy discussions limited by cost concerns. - Encouraged physical therapy for low back pain management. - Advised on posture and weight management to alleviate back pain.   Primary hypertension  (primary encounter diagnosis)  Acute midline low back pain without sciatica Plan: X-ray cervical spine 2 to 3 views  Cervicogenic headache Plan: X-ray cervical spine 2 to 3 views  Chronic TMJ pain  Neck pain Plan: X-ray cervical spine 2 to 3 views  Chronic tension-type headache, not intractable Try  esgic for tnesion type headaches.    This note has been created using automated tools and reviewed for accuracy by [facility].
